# Patient Record
Sex: Male | Born: 1960 | Race: White | Hispanic: No | Marital: Single | Smoking: Former smoker
Health system: Southern US, Community
[De-identification: ages and names within clinical notes are randomized; demographics above are authoritative.]

## PROBLEM LIST (undated history)

## (undated) DIAGNOSIS — G709 Myoneural disorder, unspecified: Secondary | ICD-10-CM

## (undated) DIAGNOSIS — M1990 Unspecified osteoarthritis, unspecified site: Secondary | ICD-10-CM

## (undated) DIAGNOSIS — I2510 Atherosclerotic heart disease of native coronary artery without angina pectoris: Secondary | ICD-10-CM

## (undated) DIAGNOSIS — E785 Hyperlipidemia, unspecified: Secondary | ICD-10-CM

## (undated) DIAGNOSIS — K219 Gastro-esophageal reflux disease without esophagitis: Secondary | ICD-10-CM

## (undated) DIAGNOSIS — T7840XA Allergy, unspecified, initial encounter: Secondary | ICD-10-CM

## (undated) DIAGNOSIS — I10 Essential (primary) hypertension: Secondary | ICD-10-CM

## (undated) DIAGNOSIS — I509 Heart failure, unspecified: Secondary | ICD-10-CM

## (undated) HISTORY — DX: Unspecified osteoarthritis, unspecified site: M19.90

## (undated) HISTORY — DX: Gastro-esophageal reflux disease without esophagitis: K21.9

## (undated) HISTORY — DX: Myoneural disorder, unspecified: G70.9

## (undated) HISTORY — PX: POLYPECTOMY: SHX149

## (undated) HISTORY — DX: Allergy, unspecified, initial encounter: T78.40XA

## (undated) HISTORY — PX: BELOW KNEE LEG AMPUTATION: SUR23

## (undated) HISTORY — PX: APPENDECTOMY: SHX54

## (undated) HISTORY — DX: Hyperlipidemia, unspecified: E78.5

## (undated) HISTORY — PX: COLONOSCOPY: SHX174

---

## 2003-10-24 ENCOUNTER — Inpatient Hospital Stay (HOSPITAL_COMMUNITY): Admission: AD | Admit: 2003-10-24 | Discharge: 2003-10-30 | Payer: Self-pay | Admitting: General Surgery

## 2003-10-24 ENCOUNTER — Encounter (INDEPENDENT_AMBULATORY_CARE_PROVIDER_SITE_OTHER): Payer: Self-pay | Admitting: Specialist

## 2004-10-13 ENCOUNTER — Encounter: Admission: RE | Admit: 2004-10-13 | Discharge: 2005-01-11 | Payer: Self-pay | Admitting: Family Medicine

## 2008-02-13 ENCOUNTER — Encounter: Admission: RE | Admit: 2008-02-13 | Discharge: 2008-02-13 | Payer: Self-pay | Admitting: Family Medicine

## 2010-06-26 NOTE — H&P (Signed)
NAME:  Dustin Lozano, Dustin Lozano                          ACCOUNT NO.:  [id number]   MEDICAL RECORD NO.:  [id number]                   PATIENT TYPE:  AMB   LOCATION:  DAY                                  FACILITY:  [facility]   PHYSICIAN:  Adolph Pollack, M.D.            DATE OF BIRTH:  07-Jan-1961   DATE OF ADMISSION:  10/24/2003  DATE OF DISCHARGE:                                HISTORY & PHYSICAL   REASON FOR ADMISSION:  Appendicitis.   HISTORY OF PRESENT ILLNESS:  This 50 year old male awoke 2 days ago with  some diffuse abdominal pain.  He states it felt like somebody punched him in  the stomach.  It subsequently was followed by fever and nausea.  The pain  then localized to the right lower quadrant.  He saw Dr. Susann Givens who ordered  the CT scan.  This was consistent with appendicitis.  I subsequently was  asked to see him.   PAST MEDICAL HISTORY:  Hypertension.   PREVIOUS OPERATIONS:  None.   ALLERGIES:  None.   MEDICATIONS:  Lisinopril.   SOCIAL HISTORY:  He is single.  Former smoker.  Occasional alcohol use.  He  is a Medical illustrator.   FAMILY HISTORY:  Positive for diabetes and heart disease and hypertension in  his father.  Positive for diabetes and lymphoma in his mother.   REVIEW OF SYSTEMS:  CARDIOVASCULAR:  No known heart disease.  PULMONARY:  No  asthma, pneumonia, chronic lung disease, TB.  GI:  No peptic ulcer disease,  hepatitis, diverticulitis, or colitis.  GU:  No kidney stones, urinary tract  infections.  ENDOCRINE:  He denies diabetes, hypercholesterolemia, thyroid  disease.  NEUROLOGIC:  No seizure disorders.  HEMATOLOGIC:  No known  bleeding disorders, blood clots, or transfusions.   PHYSICAL EXAMINATION:  GENERAL:  Ill-appearing male fairly stout.  VITAL SIGNS:  Temperature is 98, pulse 110, blood pressure is 160/113.  EYES:  Extraocular motions intact.  No icterus.  NECK:  Supple without palpable masses or obvious thyroid enlargement.  RESPIRATORY:  Breath sounds  equal and quiet.  Respirations unlabored.  CARDIOVASCULAR:  Increased rate with a regular rhythm.  No lower extremity  edema present.  ABDOMEN:  Soft, mildly obese with a reducible umbilical hernia.  There is  right lower quadrant tenderness and guarding to palpation.  No obvious  masses.  MUSCULOSKELETAL:  Good muscle tone.  Full range of motion.   LABORATORY DATA:  Hemoglobin is 16, white cell count 19,300, potassium 3.1,  sodium 132, glucose 191, BUN and creatinine normal.   CT was reviewed.   IMPRESSION:  Acute appendicitis.  Also has some hypertension.   PLAN:  Laparoscopic/possible open appendectomy.  I did explain the procedure  and the risks.  The risks include but are not limited to bleeding,  infection, accidental damage to intra-abdominal organs (intestine, ureter,  bladder, liver), and risk of general anesthesia.  He seems to  understand  this and agreeable to proceeding.                                               Adolph Pollack, M.D.    Kari Baars  D:  10/24/2003  T:  10/24/2003  Job:  [id number]   cc:   Sharlot Gowda, M.D.  [address]  [place], [place] [address]  Fax: [phone]

## 2010-06-26 NOTE — Op Note (Signed)
NAME:  Dustin Lozano, Dustin Lozano                          ACCOUNT NO.:  [id number]   MEDICAL RECORD NO.:  [id number]                   PATIENT TYPE:  AMB   LOCATION:  DAY                                  FACILITY:  [facility]   PHYSICIAN:  Adolph Pollack, M.D.            DATE OF BIRTH:  [birth year]   DATE OF PROCEDURE:  10/24/2003  DATE OF DISCHARGE:                                 OPERATIVE REPORT   PREOPERATIVE DIAGNOSES:  Appendicitis.   POSTOPERATIVE DIAGNOSES:  Ruptured appendicitis with abscess.   PROCEDURE:  Diagnostic laparoscopy converted to exploratory laparotomy with  ileocecectomy.   SURGEON:  Adolph Pollack, M.D.   ANESTHESIA:  General.   INDICATIONS FOR PROCEDURE:  This 50 year old male was seen by his primary  care physician and sent for two CT scans which demonstrated acute  appendicitis.  His white count is 19,000. He has been having fevers and  chills. He is now brought to the operating room for appendectomy. The  procedure and the risks were discussed with him preoperatively.   TECHNIQUE:  He was placed supine on the operating table and a general  anesthetic was administered.  A Foley catheter was placed in the bladder.  The abdominal wall was sterilely prepped and draped.  A transverse  subumbilical incision was made through the skin and subcutaneous tissue,  fascia and peritoneum. A pursestring suture of #0 Vicryl was placed around  the fascial edges. A Hasson trocar was introduced into the peritoneal cavity  and pneumoperitoneum was created by insufflation of CO2 gas.   Next, the laparoscope was introduced and some purulent fluid was seen in the  right lower quadrant. I then placed a 10 mm trocar in the left lower  quadrant region and one in the right upper quadrant.  I examined the right  lower quadrant contents. The appendix was firmly adherent to the right  sidewall.  Upon mobilizing this, I opened up an abscess cavity and drained  it and suctioned out  abscess fluid. I began mobilizing the cecum and ileum.  Omentum was also in the area and was mobilized. I traced the appendix down,  however, I could not see a definite connection to the cecum and I had  suspicion that he ruptured appendix at the base of the cecum.  Because of  this, I decided to convert to an open procedure.   An oblique incision was made in the right lower quadrant through the skin  and subcutaneous tissue and anterior fascia.  Muscle was split bluntly and  the fascia and peritoneum divided, peritoneal cavity entered.  I  subsequently mobilized the ascending colon and the cecum and distal ileum  and brought it up into the field. The appendix basically had perforated and  was already disconnected from the cecum. I could not see a definite site to  oversew the cecum. Based on this, I thought I needed to perform an  ileocecectomy.   I subsequently divided the ascending colon just proximal to the cecum and  also divided the ileum just proximal to the ileocecal valve. The mesentery  was divided between clamps and vessels ligated.  The specimen was handed off  the field including the appendix with it.  I then performed a side to side  stapled anastomosis. I had to oversew part of the anterior and posterior  staple line as it had some bleeding. The remaining enterotomy was closed  with a linear noncutting stapler and the staple line was oversewed as well.  The anastomosis was patent, viable and under no tension.  I subsequently  dropped it back down into the abdominal cavity.   At this portion, gloves were changed. I copiously irrigated out the  abdominal cavity with 3 liters of warm saline solution and hemostasis  appeared to be adequate. I then closed the posterior fascia with a running  #0 PDS suture.  The anterior fascia was closed with a running #1 PDS suture.  The subcutaneous tissue was irrigated. The trocars were all removed. The  subumbilical fascial defect was  closed by tightening up and tying down the  #0 Vicryl pursestring suture.  I then closed the right lower quadrant very  loosely with staples and closed the trocar sites with staples and the Steri-  Strips. Sterile dressings were applied. He tolerated the procedure well  without any apparent complications and was subsequently taken to the  recovery room in satisfactory condition.                                               Adolph Pollack, M.D.    Dustin Lozano  D:  10/24/2003  T:  10/25/2003  Job:  [id number]   cc:   Dustin Lozano, M.D.  [address]  [place], [place] [address]  Fax: [phone]

## 2010-06-26 NOTE — Discharge Summary (Signed)
NAME:  Dustin Lozano, Dustin Lozano NO.:  [id number]   MEDICAL RECORD NO.:  [id number]          PATIENT TYPE:  INP   LOCATION:  0357                         [facility]ACILITY:  [facility]   PHYSICIAN:  Adolph Pollack, M.D.DATE OF BIRTH:  1960/02/20   DATE OF ADMISSION:  10/24/2003  DATE OF DISCHARGE:  10/30/2003                                 DISCHARGE SUMMARY   PRINCIPAL DISCHARGE DIAGNOSIS:  Perforated appendicitis.   SECONDARY DIAGNOSES:  1.  Mild postoperative ileus.  2.  Hypertension.  3.  Hyperglycemia.   PROCEDURE:  Exploratory laparoscopy converted as exploratory laparotomy and  ileocecectomy October 24, 2003.   REASON FOR ADMISSION:  This 50 year old male had the onset of diffuse  abdominal pain two days prior to admission that was accompanied by a fever  and chills and nausea. The pain then radiated to the right lower quadrant.  He was seen by Dr. Susann Givens and a CT was ordered that was consistent with  appendicitis.  I saw him in the emergency department and noticed that he had  a white blood cell count of 19,000 and his glucose was 191.  He subsequently  was admitted and taken to the operating room.   HOSPITAL COURSE:  He was taken to the operating room where he underwent the  above procedure October 24, 2003.  Postoperatively he was maintained on IV  Zosyn.  I did notice him to be persistently hyperglycemic with blood sugars  ranging between the 120's up to the 170's at times. He has a family history  of diabetes but states he never had been diagnosed with diabetes.  He had a  mild ileus but the NG tube was able to be removed.  We started to advance  his diet which he tolerated.  Hypertension was relatively well controlled  with lisinopril and a Catapres patch.  He was changed over to an oral  analgesic and a solid diet on postoperative day five. By postoperative day  six, he remained afebrile. He did have some intermittent hypertension  immediately before his  lisinopril dose in the morning. His blood sugar was  153.  Overall, I felt he was doing well, the wounds were healing nicely and  he was ready for discharge.   DISPOSITION:  Discharged to home October 30, 2003.  I told him he needs to  followup with Dr. Susann Givens and have his blood pressure checked as well as his  blood sugar checked just to make sure he has not developed a type 2  diabetes.  I will have him come back and see me in one week for staple  removal and wound checked. He has been given discharge instructions. I will  have him take a week's worth of Augmentin and he was given Tylox for pain.  His condition was satisfactory.      TJR/MEDQ  D:  10/30/2003  T:  10/31/2003  Job:  [id number]   cc:   Sharlot Gowda, M.D.  [address]  [place], [place] [address]  Fax: [phone]

## 2011-08-14 ENCOUNTER — Emergency Department (HOSPITAL_COMMUNITY): Payer: Self-pay

## 2011-08-14 ENCOUNTER — Inpatient Hospital Stay (HOSPITAL_COMMUNITY)
Admission: EM | Admit: 2011-08-14 | Discharge: 2011-08-18 | DRG: 286 | Disposition: A | Discharge status: Home / Self Care | Payer: 59 | Attending: Internal Medicine | Admitting: Internal Medicine

## 2011-08-14 ENCOUNTER — Encounter (HOSPITAL_COMMUNITY): Payer: Self-pay

## 2011-08-14 DIAGNOSIS — R7309 Other abnormal glucose: Secondary | ICD-10-CM

## 2011-08-14 DIAGNOSIS — E876 Hypokalemia: Secondary | ICD-10-CM | POA: Diagnosis present

## 2011-08-14 DIAGNOSIS — I2510 Atherosclerotic heart disease of native coronary artery without angina pectoris: Secondary | ICD-10-CM | POA: Diagnosis present

## 2011-08-14 DIAGNOSIS — E785 Hyperlipidemia, unspecified: Secondary | ICD-10-CM | POA: Diagnosis present

## 2011-08-14 DIAGNOSIS — I10 Essential (primary) hypertension: Principal | ICD-10-CM | POA: Diagnosis present

## 2011-08-14 DIAGNOSIS — I509 Heart failure, unspecified: Secondary | ICD-10-CM | POA: Diagnosis present

## 2011-08-14 DIAGNOSIS — Z87891 Personal history of nicotine dependence: Secondary | ICD-10-CM

## 2011-08-14 DIAGNOSIS — I428 Other cardiomyopathies: Secondary | ICD-10-CM | POA: Diagnosis present

## 2011-08-14 DIAGNOSIS — R739 Hyperglycemia, unspecified: Secondary | ICD-10-CM

## 2011-08-14 DIAGNOSIS — I5021 Acute systolic (congestive) heart failure: Secondary | ICD-10-CM | POA: Diagnosis present

## 2011-08-14 DIAGNOSIS — E119 Type 2 diabetes mellitus without complications: Secondary | ICD-10-CM | POA: Diagnosis present

## 2011-08-14 HISTORY — DX: Essential (primary) hypertension: I10

## 2011-08-14 LAB — PRO B NATRIURETIC PEPTIDE: Pro B Natriuretic peptide (BNP): 1396 pg/mL — ABNORMAL HIGH (ref 0–125)

## 2011-08-14 LAB — POCT I-STAT TROPONIN I: Troponin i, poc: 0 ng/mL (ref 0.00–0.08)

## 2011-08-14 LAB — CARDIAC PANEL(CRET KIN+CKTOT+MB+TROPI)
CK, MB: 1.8 ng/mL (ref 0.3–4.0)
Relative Index: INVALID (ref 0.0–2.5)
Total CK: 48 U/L (ref 7–232)

## 2011-08-14 LAB — CBC: Hemoglobin: 16.7 g/dL (ref 13.0–17.0)

## 2011-08-14 LAB — BASIC METABOLIC PANEL
Creatinine, Ser: 0.67 mg/dL (ref 0.50–1.35)
GFR calc Af Amer: >90 mL/min (ref >90)
Potassium: 3.2 mEq/L — ABNORMAL LOW (ref 3.5–5.1)

## 2011-08-14 LAB — GLUCOSE, RANDOM: Glucose, Bld: 424 mg/dL — ABNORMAL HIGH (ref 70–99)

## 2011-08-14 LAB — GLUCOSE, CAPILLARY: Glucose-Capillary: 420 mg/dL — ABNORMAL HIGH (ref 70–99)

## 2011-08-14 MED ORDER — FUROSEMIDE 10 MG/ML IJ SOLN
20.0000 mg | Freq: Once | INTRAMUSCULAR | Status: AC
  Administered 2011-08-14: 20 mg via INTRAVENOUS
  Filled 2011-08-14: qty 4

## 2011-08-14 MED ORDER — ONDANSETRON HCL 4 MG/2ML IJ SOLN: 4.0000 mg | INTRAMUSCULAR | Status: DC | PRN

## 2011-08-14 MED ORDER — ACETAMINOPHEN 325 MG PO TABS: 650.0000 mg | ORAL_TABLET | ORAL | Status: DC | PRN

## 2011-08-14 MED ORDER — POTASSIUM CHLORIDE CRYS ER 20 MEQ PO TBCR
60.0000 meq | EXTENDED_RELEASE_TABLET | Freq: Once | ORAL | Status: AC
  Administered 2011-08-14: 60 meq via ORAL
  Filled 2011-08-14: qty 3

## 2011-08-14 MED ORDER — LISINOPRIL 20 MG PO TABS
40.0000 mg | ORAL_TABLET | Freq: Every day | ORAL | Status: DC
  Administered 2011-08-15 – 2011-08-18 (×4): 40 mg via ORAL
  Filled 2011-08-14 (×4): qty 2

## 2011-08-14 MED ORDER — FUROSEMIDE 10 MG/ML IJ SOLN
20.0000 mg | Freq: Two times a day (BID) | INTRAMUSCULAR | Status: DC
  Administered 2011-08-15: 20 mg via INTRAVENOUS
  Filled 2011-08-14 (×3): qty 2

## 2011-08-14 MED ORDER — ALBUTEROL SULFATE (5 MG/ML) 0.5% IN NEBU
2.5000 mg | INHALATION_SOLUTION | Freq: Once | RESPIRATORY_TRACT | Status: AC
  Administered 2011-08-14: 2.5 mg via RESPIRATORY_TRACT
  Filled 2011-08-14: qty 0.5

## 2011-08-14 MED ORDER — INSULIN ASPART 100 UNIT/ML ~~LOC~~ SOLN
0.0000 [IU] | Freq: Three times a day (TID) | SUBCUTANEOUS | Status: DC
  Administered 2011-08-15: 3 [IU] via SUBCUTANEOUS
  Administered 2011-08-15: 2 [IU] via SUBCUTANEOUS
  Administered 2011-08-15 – 2011-08-16 (×3): 1 [IU] via SUBCUTANEOUS
  Administered 2011-08-16: 2 [IU] via SUBCUTANEOUS
  Administered 2011-08-17: 3 [IU] via SUBCUTANEOUS
  Administered 2011-08-18: 1 [IU] via SUBCUTANEOUS

## 2011-08-14 MED ORDER — LISINOPRIL 20 MG PO TABS
40.0000 mg | ORAL_TABLET | ORAL | Status: AC
  Administered 2011-08-14: 40 mg via ORAL
  Filled 2011-08-14: qty 2

## 2011-08-14 MED ORDER — INSULIN ASPART 100 UNIT/ML ~~LOC~~ SOLN: 0.0000 [IU] | Freq: Every day | SUBCUTANEOUS | Status: DC

## 2011-08-14 MED ORDER — ASPIRIN 81 MG PO CHEW
CHEWABLE_TABLET | ORAL | Status: AC
  Filled 2011-08-14: qty 4

## 2011-08-14 MED ORDER — POTASSIUM CHLORIDE CRYS ER 20 MEQ PO TBCR
20.0000 meq | EXTENDED_RELEASE_TABLET | Freq: Two times a day (BID) | ORAL | Status: DC
  Filled 2011-08-14: qty 3
  Filled 2011-08-14 (×2): qty 1

## 2011-08-14 MED ORDER — IPRATROPIUM BROMIDE 0.02 % IN SOLN
0.5000 mg | Freq: Once | RESPIRATORY_TRACT | Status: AC
  Administered 2011-08-14: 0.5 mg via RESPIRATORY_TRACT
  Filled 2011-08-14: qty 2.5

## 2011-08-14 MED ORDER — ENOXAPARIN SODIUM 40 MG/0.4ML ~~LOC~~ SOLN
40.0000 mg | SUBCUTANEOUS | Status: DC
  Administered 2011-08-14 – 2011-08-15 (×2): 40 mg via SUBCUTANEOUS
  Filled 2011-08-14 (×3): qty 0.4

## 2011-08-14 MED ORDER — HYDRALAZINE HCL 20 MG/ML IJ SOLN
10.0000 mg | INTRAMUSCULAR | Status: DC | PRN
  Filled 2011-08-14: qty 0.5

## 2011-08-14 MED ORDER — ASPIRIN EC 81 MG PO TBEC
81.0000 mg | DELAYED_RELEASE_TABLET | Freq: Every day | ORAL | Status: DC
  Administered 2011-08-15 – 2011-08-18 (×4): 81 mg via ORAL
  Filled 2011-08-14 (×5): qty 1

## 2011-08-14 MED ORDER — INSULIN ASPART 100 UNIT/ML ~~LOC~~ SOLN
12.0000 [IU] | Freq: Once | SUBCUTANEOUS | Status: AC
  Administered 2011-08-14: 12 [IU] via SUBCUTANEOUS

## 2011-08-14 MED ORDER — ALBUTEROL SULFATE (5 MG/ML) 0.5% IN NEBU
5.0000 mg | INHALATION_SOLUTION | Freq: Once | RESPIRATORY_TRACT | Status: AC
  Administered 2011-08-14: 5 mg via RESPIRATORY_TRACT
  Filled 2011-08-14: qty 1

## 2011-08-14 MED ORDER — SODIUM CHLORIDE 0.9 % IV SOLN: 250.0000 mL | INTRAVENOUS | Status: DC | PRN

## 2011-08-14 MED ORDER — SODIUM CHLORIDE 0.9 % IJ SOLN: 3.0000 mL | INTRAMUSCULAR | Status: DC | PRN

## 2011-08-14 MED ORDER — NITROGLYCERIN 0.4 MG SL SUBL: 0.4000 mg | SUBLINGUAL_TABLET | SUBLINGUAL | Status: DC | PRN

## 2011-08-14 MED ORDER — NITROGLYCERIN 2 % TD OINT
1.0000 [in_us] | TOPICAL_OINTMENT | Freq: Once | TRANSDERMAL | Status: AC
  Administered 2011-08-14: 1 [in_us] via TOPICAL
  Filled 2011-08-14: qty 30

## 2011-08-14 MED ORDER — ASPIRIN 325 MG PO TABS
325.0000 mg | ORAL_TABLET | ORAL | Status: DC
  Filled 2011-08-14: qty 4

## 2011-08-14 MED ORDER — SODIUM CHLORIDE 0.9 % IJ SOLN
3.0000 mL | Freq: Two times a day (BID) | INTRAMUSCULAR | Status: DC
  Administered 2011-08-14 – 2011-08-17 (×7): 3 mL via INTRAVENOUS
  Administered 2011-08-18: 10:00:00 via INTRAVENOUS

## 2011-08-14 MED ORDER — METHYLPREDNISOLONE SODIUM SUCC 125 MG IJ SOLR
125.0000 mg | Freq: Once | INTRAMUSCULAR | Status: AC
  Administered 2011-08-14: 125 mg via INTRAVENOUS
  Filled 2011-08-14: qty 2

## 2011-08-14 NOTE — H&P (Signed)
PCP:  unassigned  Chief Complaint:  Shortness of breath for 4 days  HPI: Dustin Lozano is an 51 y.o. male.  Middle-aged Caucasian gentleman, no medical followup for hypertension for the past 6 years, has lost over 70 pounds by deliberate diet and exercise over this period, and has considered himself in good health, until he developed progressive he is shortness of breath with exertion, and lying flat, and difficulty sleeping because of shortness of breath while lying down, all over the past 4 days. Denies leg edema, denies hemoptysis.  Quit smoking 5 years ago; drinks 8-12 glasses of water daily as a part of his health regimen.  Denies palpitations, chest pain, black or bloody stool, nausea or vomiting, fever cough or cold. Denies alcohol abuse or abdominal distention.  Had upper respiratory infection about 6 weeks ago, and it persisted for about 2 weeks. Denies diuretic use.  Rewiew of Systems:  The patient denies anorexia, fever, weight loss,, vision loss, decreased hearing, hoarseness, chest pain, syncope,  peripheral edema, balance deficits, hemoptysis, abdominal pain, melena, hematochezia, severe indigestion/heartburn, hematuria, incontinence, genital sores, muscle weakness, suspicious skin lesions, transient blindness, difficulty walking, depression, unusual weight change, abnormal bleeding, enlarged lymph nodes, angioedema, and breast masses.    Past Medical History  Diagnosis Date  . HTN (hypertension)     Past Surgical History  Procedure Date  . Appendectomy     Medications:  HOME MEDS: Prior to Admission medications   Medication Sig Start Date End Date Taking? Authorizing Provider  Multiple Vitamin (MULTIVITAMIN WITH MINERALS) TABS Take 1 tablet by mouth daily.   Yes Historical Provider, MD     Allergies:  No Known Allergies  Social History:   reports that he quit smoking about 5 years ago. He does not have any smokeless tobacco history on file. He reports that  he does not drink alcohol or use illicit drugs.  Family History: Family History  Problem Relation Age of Onset  . Diabetes Father   . Heart failure Father   . Cancer Mother     lymphoma  . Diabetes Mother      Physical Exam: Filed Vitals:   08/14/11 1602 08/14/11 1630 08/14/11 1830  BP: 167/111 165/113 177/108  Pulse: 118 118 117  Temp: 98.9 F (37.2 C)    TempSrc: Oral    Resp: 20 28 29   SpO2: 95%     Blood pressure 177/108, pulse 117, temperature 98.9 F (37.2 C), temperature source Oral, resp. rate 29, SpO2 95.00%.  GEN:  Pleasant middle-aged Caucasian gentleman  lying in the stretcher; cooperative with exam PSYCH:  alert and oriented x4; does not appear anxious or depressed; affect is appropriate. HEENT: Mucous membranes pink and anicteric; PERRLA; EOM intact; no cervical lymphadenopathy nor thyromegaly or carotid bruit; JVD to angle of jaw ; Breasts:: Not examined CHEST WALL: No tenderness CHEST: Normal respiration, clear to auscultation bilaterally HEART:  tachycardic regular rhythm ; no murmurs rubs or gallops BACK: No kyphosis or scoliosis; no CVA tenderness ABDOMEN: Obese, soft non-tender; no masses, no organomegaly, normal abdominal bowel sounds; no pannus; no intertriginous candida. Rectal Exam: Not done EXTREMITIES: age-appropriate arthropathy of the hands and knees; no edema; no ulcerations. Genitalia: not examined PULSES: 2+ and symmetric SKIN: Normal hydration, rudy complexion  no rash or ulceration CNS: Cranial nerves 2-12 grossly intact no focal lateralizing neurologic deficit   Labs & Imaging Results for orders placed during the hospital encounter of 08/14/11 (from the past 48 hour(s))  CBC  Status: Abnormal   Collection Time   08/14/11  4:23 PM      Component Value Range Comment   WBC 9.2  4.0 - 10.5 K/uL    RBC 5.85 (*) 4.22 - 5.81 MIL/uL    Hemoglobin 16.7  13.0 - 17.0 g/dL    HCT 16.1  09.6 - 04.5 %    MCV 80.0  78.0 - 100.0 fL    MCH 28.5   26.0 - 34.0 pg    MCHC 35.7  30.0 - 36.0 g/dL    RDW 40.9  81.1 - 91.4 %    Platelets 363  150 - 400 K/uL   BASIC METABOLIC PANEL     Status: Abnormal   Collection Time   08/14/11  4:23 PM      Component Value Range Comment   Sodium 138  135 - 145 mEq/L    Potassium 3.2 (*) 3.5 - 5.1 mEq/L    Chloride 101  96 - 112 mEq/L    CO2 23  19 - 32 mEq/L    Glucose, Bld 155 (*) 70 - 99 mg/dL    BUN 11  6 - 23 mg/dL    Creatinine, Ser 7.82  0.50 - 1.35 mg/dL    Calcium 9.8  8.4 - 95.6 mg/dL    GFR calc non Af Amer >90  >90 mL/min    GFR calc Af Amer >90  >90 mL/min   PRO B NATRIURETIC PEPTIDE     Status: Abnormal   Collection Time   08/14/11  4:23 PM      Component Value Range Comment   Pro B Natriuretic peptide (BNP) 1396.0 (*) 0 - 125 pg/mL   POCT I-STAT TROPONIN I     Status: Normal   Collection Time   08/14/11  4:38 PM      Component Value Range Comment   Troponin i, poc 0.00  0.00 - 0.08 ng/mL    Comment 3             Dg Chest 2 View  08/14/2011  *RADIOLOGY REPORT*  Clinical Data: Short of breath  CHEST - 2 VIEW  Comparison: Chest radiograph 10/24/2003  Findings: Normal cardiac silhouette.  There is increased bilateral pleural effusions compared to prior.  These are small in volume. There is a mild interstitial edema pattern.  There is central bronchitic markings are similar to prior.  No focal consolidation. No pneumothorax.  IMPRESSION: Bilateral small effusions interstitial edema which is mild.  Original Report Authenticated By: Genevive Bi, M.D.      Assessment Present on Admission:  .HTN (hypertension), malignant, presenting as acute CHF  .CHF, acute, probably due to uncontrolled hypertension; possible viral myocarditis  .Hyperglycemia .Hypokalemia, possibly related to excess water intake   PLAN:  we'll continue diuresing this gentleman, initial aggressive replacement of potassium, given additional losses which will come from diuresis.   Will restart lisinopril now, and  give when necessary doses of hydralazine for blood pressure control; will not start a beta blocker while he is in acute failure, despite tachycardic.  2-D echo, tsh,re-check BNP and lipid panel in the morning.  Check hemoglobin A1c and use sliding scale for hyperglycemia.  Counsel on the importance of Tight blood pressure control to preserve cardiovascular integrity  Other plans as per orders.  Zebadiah Willert 08/14/2011, 6:54 PM

## 2011-08-14 NOTE — ED Notes (Signed)
Report given to receiving RN.

## 2011-08-14 NOTE — ED Notes (Addendum)
[facility]/CP states onset 4 days ago denies n/v denies pain radiating states only comfort with sob is when sitting up, denies swelling in extremities, states pain in chest is tightness and squeezing

## 2011-08-14 NOTE — ED Notes (Signed)
Physician at bedside.

## 2011-08-14 NOTE — ED Notes (Signed)
Pt transported to XR.  

## 2011-08-14 NOTE — ED Notes (Signed)
Pt back in room from XR 

## 2011-08-14 NOTE — ED Provider Notes (Signed)
History     CSN: [id number]  Arrival date & time 08/14/11  1540   First MD Initiated Contact with Patient 08/14/11 1611      Chief Complaint  Patient presents with  . Shortness of Breath    (Consider location/radiation/quality/duration/timing/severity/associated sxs/prior treatment) HPI This 51 year old male has had untreated hypertension for several years, he does not have any known coronary artery disease, he presents with 4 days of constant 24-hour a day mild to moderate shortness of breath, sensation is worse when he is supine and better if he sits up, it is not necessarily worsened with exertion however, and he has no chest pain no cough no fever no abdominal pain no vomiting no bloody stools. He is no weight gain and no edema. There is no treatment prior to arrival. Past Medical History  Diagnosis Date  . HTN (hypertension)     Past Surgical History  Procedure Date  . Appendectomy     Family History  Problem Relation Age of Onset  . Diabetes Father   . Heart failure Father   . Cancer Mother     lymphoma  . Diabetes Mother     History  Substance Use Topics  . Smoking status: Former Smoker -- 0.7 packs/day for 15 years    Quit date: 02/08/2006  . Smokeless tobacco: Not on file  . Alcohol Use: No      Review of Systems  Constitutional: Negative for fever.       10 Systems reviewed and are negative for acute change except as noted in the HPI.  HENT: Negative for congestion.   Eyes: Negative for discharge and redness.  Respiratory: Positive for shortness of breath. Negative for cough and stridor.   Cardiovascular: Negative for chest pain.  Gastrointestinal: Negative for vomiting and abdominal pain.  Musculoskeletal: Negative for back pain.  Skin: Negative for rash.  Neurological: Negative for syncope, numbness and headaches.  Psychiatric/Behavioral:       No behavior change.    Allergies  Review of patient's allergies indicates no known allergies.  Home  Medications   No current outpatient prescriptions on file.  BP 152/95  Pulse 112  Temp 97.5 F (36.4 C) (Oral)  Resp 18  Ht 6' (1.829 m)  Wt 189 lb (85.73 kg)  BMI 25.63 kg/m2  SpO2 94%  Physical Exam  Nursing note and vitals reviewed. Constitutional:       Awake, alert, nontoxic appearance.  HENT:  Head: Atraumatic.  Mouth/Throat: No oropharyngeal exudate.  Eyes: Right eye exhibits no discharge. Left eye exhibits no discharge.  Neck: Neck supple.  Cardiovascular: Regular rhythm.   No murmur heard.      Tachycardic  Pulmonary/Chest: He is in respiratory distress. He has no wheezes. He has rales. He exhibits no tenderness.       Crackles halfway up the right side, basilar crackles on the left side, no wheezing retractions or rhonchi noted however the patient is a generally decreased breath sounds bilaterally without accessory muscle usage, he is able to speak full sentences with mild resp distress at rest with rare pulse oximetry normal at 95%  Abdominal: Soft. There is no tenderness. There is no rebound.  Musculoskeletal: He exhibits no edema and no tenderness.       Baseline ROM, no obvious new focal weakness.  Neurological: He is alert.       Mental status and motor strength appears baseline for patient and situation.  Skin: No rash noted.  Psychiatric: He has  a normal mood and affect.    ED Course  Procedures (including critical care time) ECG: Sinus tachycardia, ventricular rate 122, normal axis, left ventricular hypertrophy, lateral inverted T waves, compared to September 2005 the T wave changes are new in the lateral leads  Feels much better after initial neb and exp wheezes now heard as well as bibasilar rales, [facility] paged 1800. Labs Reviewed  CBC - Abnormal; Notable for the following:    RBC 5.85 (*)     All other components within normal limits  BASIC METABOLIC PANEL - Abnormal; Notable for the following:    Potassium 3.2 (*)     Glucose, Bld 155 (*)     All  other components within normal limits  PRO B NATRIURETIC PEPTIDE - Abnormal; Notable for the following:    Pro B Natriuretic peptide (BNP) 1396.0 (*)     All other components within normal limits  HEMOGLOBIN A1C - Abnormal; Notable for the following:    Hemoglobin A1C 8.7 (*)     Mean Plasma Glucose 203 (*)     All other components within normal limits  BASIC METABOLIC PANEL - Abnormal; Notable for the following:    Potassium 3.3 (*)     Glucose, Bld 245 (*)     All other components within normal limits  GLUCOSE, RANDOM - Abnormal; Notable for the following:    Glucose, Bld 424 (*)     All other components within normal limits  GLUCOSE, CAPILLARY - Abnormal; Notable for the following:    Glucose-Capillary 420 (*)     All other components within normal limits  LIPID PANEL - Abnormal; Notable for the following:    LDL Cholesterol 113 (*)     All other components within normal limits  GLUCOSE, CAPILLARY - Abnormal; Notable for the following:    Glucose-Capillary 249 (*)     All other components within normal limits  GLUCOSE, CAPILLARY - Abnormal; Notable for the following:    Glucose-Capillary 189 (*)     All other components within normal limits  POCT I-STAT TROPONIN I  MAGNESIUM  MAGNESIUM  TSH  CARDIAC PANEL(CRET KIN+CKTOT+MB+TROPI)  CARDIAC PANEL(CRET KIN+CKTOT+MB+TROPI)  CARDIAC PANEL(CRET KIN+CKTOT+MB+TROPI)   Dg Chest 2 View  08/14/2011  *RADIOLOGY REPORT*  Clinical Data: Short of breath  CHEST - 2 VIEW  Comparison: Chest radiograph 10/24/2003  Findings: Normal cardiac silhouette.  There is increased bilateral pleural effusions compared to prior.  These are small in volume. There is a mild interstitial edema pattern.  There is central bronchitic markings are similar to prior.  No focal consolidation. No pneumothorax.  IMPRESSION: Bilateral small effusions interstitial edema which is mild.  Original Report Authenticated By: Genevive Bi, M.D.     1. HTN (hypertension),  malignant   2. CHF, acute   3. Hyperglycemia   4. Hypokalemia       MDM  Pt stable in ED with no significant deterioration in condition.Patient / Family / Caregiver informed of clinical course, understand medical decision-making process, and agree with plan.        Hurman Horn, MD 08/15/11 [phone]

## 2011-08-15 LAB — CARDIAC PANEL(CRET KIN+CKTOT+MB+TROPI)
CK, MB: 2.2 ng/mL (ref 0.3–4.0)
Relative Index: INVALID (ref 0.0–2.5)
Troponin I: <0.3 ng/mL (ref <0.30)

## 2011-08-15 LAB — LIPID PANEL: Cholesterol: 174 mg/dL (ref 0–200)

## 2011-08-15 LAB — BASIC METABOLIC PANEL
CO2: 24 mEq/L (ref 19–32)
Chloride: 101 mEq/L (ref 96–112)
Creatinine, Ser: 0.64 mg/dL (ref 0.50–1.35)
Sodium: 136 mEq/L (ref 135–145)

## 2011-08-15 LAB — TSH: TSH: 1.333 u[IU]/mL (ref 0.350–4.500)

## 2011-08-15 LAB — GLUCOSE, CAPILLARY
Glucose-Capillary: 249 mg/dL — ABNORMAL HIGH (ref 70–99)
Glucose-Capillary: 161 mg/dL — ABNORMAL HIGH (ref 70–99)

## 2011-08-15 LAB — HEMOGLOBIN A1C: Mean Plasma Glucose: 203 mg/dL — ABNORMAL HIGH (ref <117)

## 2011-08-15 MED ORDER — FUROSEMIDE 10 MG/ML IJ SOLN
40.0000 mg | Freq: Two times a day (BID) | INTRAMUSCULAR | Status: DC
  Administered 2011-08-15 – 2011-08-16 (×2): 40 mg via INTRAVENOUS
  Filled 2011-08-15 (×4): qty 4

## 2011-08-15 MED ORDER — ZOLPIDEM TARTRATE 5 MG PO TABS
5.0000 mg | ORAL_TABLET | Freq: Every evening | ORAL | Status: DC | PRN
  Administered 2011-08-15 – 2011-08-17 (×3): 5 mg via ORAL
  Filled 2011-08-15 (×3): qty 1

## 2011-08-15 MED ORDER — POTASSIUM CHLORIDE CRYS ER 20 MEQ PO TBCR
40.0000 meq | EXTENDED_RELEASE_TABLET | Freq: Two times a day (BID) | ORAL | Status: DC
  Administered 2011-08-15 – 2011-08-18 (×7): 40 meq via ORAL
  Filled 2011-08-15 (×8): qty 2

## 2011-08-15 MED ORDER — INSULIN GLARGINE 100 UNIT/ML ~~LOC~~ SOLN
40.0000 [IU] | Freq: Every day | SUBCUTANEOUS | Status: DC
  Administered 2011-08-15 – 2011-08-16 (×2): 40 [IU] via SUBCUTANEOUS
  Administered 2011-08-17: 20 [IU] via SUBCUTANEOUS
  Administered 2011-08-18: 40 [IU] via SUBCUTANEOUS

## 2011-08-15 MED ORDER — PERFLUTREN LIPID MICROSPHERE 6.52 MG/ML IV SUSP
10.0000 uL/kg | Freq: Once | INTRAVENOUS | Status: AC
  Administered 2011-08-15: 0.946 mg via INTRAVENOUS
  Filled 2011-08-15: qty 2

## 2011-08-15 NOTE — Progress Notes (Signed)
Subjective: Improved shortness of breath   Objective: Vital signs in last 24 hours: Filed Vitals:   08/14/11 1932 08/14/11 1956 08/15/11 0226 08/15/11 0509  BP: 142/83 143/94 120/75 125/78  Pulse:  124 102 84  Temp:  97.7 F (36.5 C) 97.4 F (36.3 C) 97.6 F (36.4 C)  TempSrc:  Oral Oral Oral  Resp: 30 22 20 20   Height:  6' (1.829 m)    Weight:  88.134 kg (194 lb 4.8 oz)  85.73 kg (189 lb)  SpO2:  94% 91% 91%    Intake/Output Summary (Last 24 hours) at 08/15/11 0910 Last data filed at 08/15/11 0800  Gross per 24 hour  Intake      0 ml  Output   2575 ml  Net  -2575 ml    Weight change:    GEN: Pleasant middle-aged Caucasian gentleman lying in the stretcher; cooperative with exam  PSYCH: alert and oriented x4; does not appear anxious or depressed; affect is appropriate.  HEENT: Mucous membranes pink and anicteric; PERRLA; EOM intact; no cervical lymphadenopathy nor thyromegaly or carotid bruit; JVD to angle of jaw ;  Breasts:: Not examined  CHEST WALL: No tenderness  CHEST: Normal respiration, clear to auscultation bilaterally  HEART: tachycardic regular rhythm ; no murmurs rubs or gallops  BACK: No kyphosis or scoliosis; no CVA tenderness  ABDOMEN: Obese, soft non-tender; no masses, no organomegaly, normal abdominal bowel sounds; no pannus; no intertriginous candida.  Rectal Exam: Not done  EXTREMITIES: age-appropriate arthropathy of the hands and knees; no edema; no ulcerations.  Genitalia: not examined  PULSES: 2+ and symmetric  SKIN: Normal hydration, rudy complexion no rash or ulceration  CNS: Cranial nerves 2-12 grossly intact no focal lateralizing neurologic deficit  Lab Results: Results for orders placed during the hospital encounter of 08/14/11 (from the past 24 hour(s))  CBC     Status: Abnormal   Collection Time   08/14/11  4:23 PM      Component Value Range   WBC 9.2  4.0 - 10.5 K/uL   RBC 5.85 (*) 4.22 - 5.81 MIL/uL   Hemoglobin 16.7  13.0 - 17.0 g/dL     HCT 52.8  41.3 - 24.4 %   MCV 80.0  78.0 - 100.0 fL   MCH 28.5  26.0 - 34.0 pg   MCHC 35.7  30.0 - 36.0 g/dL   RDW 01.0  27.2 - 53.6 %   Platelets 363  150 - 400 K/uL  BASIC METABOLIC PANEL     Status: Abnormal   Collection Time   08/14/11  4:23 PM      Component Value Range   Sodium 138  135 - 145 mEq/L   Potassium 3.2 (*) 3.5 - 5.1 mEq/L   Chloride 101  96 - 112 mEq/L   CO2 23  19 - 32 mEq/L   Glucose, Bld 155 (*) 70 - 99 mg/dL   BUN 11  6 - 23 mg/dL   Creatinine, Ser 6.44  0.50 - 1.35 mg/dL   Calcium 9.8  8.4 - 03.4 mg/dL   GFR calc non Af Amer >90  >90 mL/min   GFR calc Af Amer >90  >90 mL/min  PRO B NATRIURETIC PEPTIDE     Status: Abnormal   Collection Time   08/14/11  4:23 PM      Component Value Range   Pro B Natriuretic peptide (BNP) 1396.0 (*) 0 - 125 pg/mL  MAGNESIUM     Status: Normal  Collection Time   08/14/11  4:25 PM      Component Value Range   Magnesium 2.0  1.5 - 2.5 mg/dL  HEMOGLOBIN Z6X     Status: Abnormal   Collection Time   08/14/11  4:25 PM      Component Value Range   Hemoglobin A1C 8.7 (*) <5.7 %   Mean Plasma Glucose 203 (*) <117 mg/dL  POCT I-STAT TROPONIN I     Status: Normal   Collection Time   08/14/11  4:38 PM      Component Value Range   Troponin i, poc 0.00  0.00 - 0.08 ng/mL   Comment 3           TSH     Status: Normal   Collection Time   08/14/11  8:11 PM      Component Value Range   TSH 1.333  0.350 - 4.500 uIU/mL  CARDIAC PANEL(CRET KIN+CKTOT+MB+TROPI)     Status: Normal   Collection Time   08/14/11  8:11 PM      Component Value Range   Total CK 48  7 - 232 U/L   CK, MB 1.8  0.3 - 4.0 ng/mL   Troponin I <0.30  <0.30 ng/mL   Relative Index RELATIVE INDEX IS INVALID  0.0 - 2.5  GLUCOSE, CAPILLARY     Status: Abnormal   Collection Time   08/14/11 10:35 PM      Component Value Range   Glucose-Capillary 420 (*) 70 - 99 mg/dL   Comment 1 Documented in Chart     Comment 2 Notify RN    GLUCOSE, RANDOM     Status: Abnormal   Collection  Time   08/14/11 10:55 PM      Component Value Range   Glucose, Bld 424 (*) 70 - 99 mg/dL  MAGNESIUM     Status: Normal   Collection Time   08/15/11  4:54 AM      Component Value Range   Magnesium 1.9  1.5 - 2.5 mg/dL  CARDIAC PANEL(CRET KIN+CKTOT+MB+TROPI)     Status: Normal   Collection Time   08/15/11  4:54 AM      Component Value Range   Total CK 34  7 - 232 U/L   CK, MB 1.3  0.3 - 4.0 ng/mL   Troponin I <0.30  <0.30 ng/mL   Relative Index RELATIVE INDEX IS INVALID  0.0 - 2.5  BASIC METABOLIC PANEL     Status: Abnormal   Collection Time   08/15/11  4:54 AM      Component Value Range   Sodium 136  135 - 145 mEq/L   Potassium 3.3 (*) 3.5 - 5.1 mEq/L   Chloride 101  96 - 112 mEq/L   CO2 24  19 - 32 mEq/L   Glucose, Bld 245 (*) 70 - 99 mg/dL   BUN 16  6 - 23 mg/dL   Creatinine, Ser 0.96  0.50 - 1.35 mg/dL   Calcium 9.5  8.4 - 04.5 mg/dL   GFR calc non Af Amer >90  >90 mL/min   GFR calc Af Amer >90  >90 mL/min  GLUCOSE, CAPILLARY     Status: Abnormal   Collection Time   08/15/11  7:48 AM      Component Value Range   Glucose-Capillary 249 (*) 70 - 99 mg/dL   Comment 1 Notify RN       Micro: No results found for this or any previous visit (from the  past 240 hour(s)).  Studies/Results: Dg Chest 2 View  08/14/2011  *RADIOLOGY REPORT*  Clinical Data: Short of breath  CHEST - 2 VIEW  Comparison: Chest radiograph 10/24/2003  Findings: Normal cardiac silhouette.  There is increased bilateral pleural effusions compared to prior.  These are small in volume. There is a mild interstitial edema pattern.  There is central bronchitic markings are similar to prior.  No focal consolidation. No pneumothorax.  IMPRESSION: Bilateral small effusions interstitial edema which is mild.  Original Report Authenticated By: Genevive Bi, M.D.    Medications:  Scheduled Meds:   . albuterol  2.5 mg Nebulization Once  . albuterol  5 mg Nebulization Once  . aspirin EC  81 mg Oral Daily  . enoxaparin  (LOVENOX) injection  40 mg Subcutaneous Q24H  . furosemide  20 mg Intravenous Once  . furosemide  20 mg Intravenous Once  . furosemide  20 mg Intravenous Q12H  . insulin aspart  0-5 Units Subcutaneous QHS  . insulin aspart  0-9 Units Subcutaneous TID WC  . insulin aspart  12 Units Subcutaneous Once  . ipratropium  0.5 mg Nebulization Once  . lisinopril  40 mg Oral NOW   Followed by  . lisinopril  40 mg Oral Daily  . methylPREDNISolone (SOLU-MEDROL) injection  125 mg Intravenous Once  . nitroGLYCERIN  1 inch Topical Once  . potassium chloride  60 mEq Oral Once   Followed by  . potassium chloride  20 mEq Oral BID  . sodium chloride  3 mL Intravenous Q12H  . DISCONTD: aspirin      . DISCONTD: aspirin  325 mg Oral STAT   Continuous Infusions:  PRN Meds:.sodium chloride, acetaminophen, hydrALAZINE, nitroGLYCERIN, ondansetron (ZOFRAN) IV, sodium chloride   Assessment: Active Problems:  HTN (hypertension), malignant  CHF, acute  Hyperglycemia  Hypokalemia   Plan: #1 2-D echo is pending #2 consult diabetes educator for new onset diabetes, change to carb modified diet, initiate Lantus #3 check a lipid panel #4 replete potassium   LOS: 1 day   Corneisha Alvi 08/15/2011, 9:10 AM

## 2011-08-15 NOTE — Progress Notes (Signed)
Cm spoke with patient concerning Md order for Home Heart Failure Screen. Patient lives alone, self pay, employed. Patient agrees with HF screen. [facility] rep Talmadge Coventry notified of new referral. No PCP. Patient provided with information concerning [facility], [facility], & [facility]. New onset Diabetic, proposed home on oral DM meds. DM consult entered per MD. Patient provided with discount rx card to assist with cost of medications. Patient eligible for indigent funds, states able to afford home meds if generic. No further needs requested. Patient provided with packet of other community resources.   Leonie Green [phone]

## 2011-08-15 NOTE — Progress Notes (Signed)
  Echocardiogram 2D Echocardiogram has been performed.  Emelia Loron 08/15/2011, 11:50 AM

## 2011-08-16 ENCOUNTER — Encounter (HOSPITAL_COMMUNITY): Payer: Self-pay | Admitting: Physician Assistant

## 2011-08-16 DIAGNOSIS — I428 Other cardiomyopathies: Secondary | ICD-10-CM

## 2011-08-16 DIAGNOSIS — I5021 Acute systolic (congestive) heart failure: Secondary | ICD-10-CM

## 2011-08-16 LAB — GLUCOSE, CAPILLARY
Glucose-Capillary: 135 mg/dL — ABNORMAL HIGH (ref 70–99)
Glucose-Capillary: 183 mg/dL — ABNORMAL HIGH (ref 70–99)
Glucose-Capillary: 152 mg/dL — ABNORMAL HIGH (ref 70–99)

## 2011-08-16 LAB — BASIC METABOLIC PANEL
BUN: 20 mg/dL (ref 6–23)
Chloride: 104 mEq/L (ref 96–112)
GFR calc Af Amer: >90 mL/min (ref >90)
GFR calc non Af Amer: >90 mL/min (ref >90)
Glucose, Bld: 140 mg/dL — ABNORMAL HIGH (ref 70–99)
Potassium: 3.9 mEq/L (ref 3.5–5.1)
Sodium: 139 mEq/L (ref 135–145)

## 2011-08-16 MED ORDER — ASPIRIN 81 MG PO CHEW
324.0000 mg | CHEWABLE_TABLET | ORAL | Status: AC
  Administered 2011-08-17: 324 mg via ORAL
  Filled 2011-08-16: qty 4

## 2011-08-16 MED ORDER — LIVING WELL WITH DIABETES BOOK
Freq: Once | Status: AC
  Administered 2011-08-16: 22:00:00
  Filled 2011-08-16 (×2): qty 1

## 2011-08-16 MED ORDER — CARVEDILOL 3.125 MG PO TABS
3.1250 mg | ORAL_TABLET | Freq: Two times a day (BID) | ORAL | Status: DC
  Administered 2011-08-16 – 2011-08-18 (×5): 3.125 mg via ORAL
  Filled 2011-08-16 (×7): qty 1

## 2011-08-16 MED ORDER — FUROSEMIDE 20 MG PO TABS
20.0000 mg | ORAL_TABLET | Freq: Every day | ORAL | Status: DC
  Administered 2011-08-17 – 2011-08-18 (×2): 20 mg via ORAL
  Filled 2011-08-16 (×2): qty 1

## 2011-08-16 MED ORDER — HEPARIN SODIUM (PORCINE) 5000 UNIT/ML IJ SOLN
5000.0000 [IU] | Freq: Three times a day (TID) | INTRAMUSCULAR | Status: AC
  Administered 2011-08-16: 5000 [IU] via SUBCUTANEOUS
  Filled 2011-08-16: qty 1

## 2011-08-16 MED ORDER — SODIUM CHLORIDE 0.9 % IJ SOLN: 3.0000 mL | INTRAMUSCULAR | Status: DC | PRN

## 2011-08-16 MED ORDER — SODIUM CHLORIDE 0.9 % IJ SOLN
3.0000 mL | Freq: Two times a day (BID) | INTRAMUSCULAR | Status: DC
  Administered 2011-08-16 – 2011-08-17 (×3): 3 mL via INTRAVENOUS

## 2011-08-16 MED ORDER — SODIUM CHLORIDE 0.9 % IV SOLN: 250.0000 mL | INTRAVENOUS | Status: DC | PRN

## 2011-08-16 NOTE — Progress Notes (Signed)
Pt viewed video on cardiac catheterization.  Reviewed information.  Pt had no questions.

## 2011-08-16 NOTE — Consult Note (Signed)
CARDIOLOGY CONSULT NOTE  Patient ID: Dustin Lozano, MRN: [id number], DOB/AGE: 1960-10-20 51 y.o. Admit date: 08/14/2011   Date of Consult: 08/16/2011 Primary Physician: No primary provider on file. Primary Cardiologist: New  Chief Complaint: shortness of breath Reason for Consult: newly recognized cardiomyopathy  HPI: 51 y/o white male with PMH of hypertension, newly diagnosed DM in hospital & no prior cardiac hx seen in consult for newly recognized cardiomyopathy. He has never seen a cardiologist and has not been followed by a PCP or taken blood pressure medication for 3 years due to lack of insurance. Wednesday, 08/11/11, the the patient became acutely aware of shortness of breath while walking, and the shortness of breath worsened when he lay down to sleep at night. He could not lie flat in the bed due to his shortness of breath and also experienced chest "pounding," as he tried to breathe while lying flat. He was able to sleep ~2 hours Wednesday-Friday evenings on 2 pillows, but due to persistence of his SOB he presented to an urgent care for evaluation on Saturday, 08/14/11.  The urgent care performed an EKG and following the EKG recommended he present to an emergency department for further evaluation which he did.  Workup thus far reveals EF 25-30% by echo with diffuse hypokinesis. pBNP was 1396. CE's have been negative x 3. Glucose was high with A1C 8.7 indicating new dx of DM. TSH WNL. He has since been placed on IV Lasix with subsequent improvement in symptoms, has diuresed about 3 L and 5lbs. He feels much better and is actually requesting to go home.  Past Medical History  Diagnosis Date  . HTN (hypertension)   . Hyperglycemia       Most Recent Cardiac Studies: 2D Echo 08/15/11 Study Conclusions - Left ventricle: The cavity size was mildly dilated. There was mild concentric hypertrophy. Systolic function was severely reduced. The estimated ejection fraction was in the range of 25% to 30%.  Diffuse hypokinesis with asynchronous contraction. Doppler parameters are consistent with a reversible restrictive pattern, indicative of decreased left ventricular diastolic compliance and/or increased left atrial pressure (grade 3 diastolic dysfunction). - Mitral valve: Mild regurgitation.   Surgical History:  Past Surgical History  Procedure Date  . Appendectomy      Home Meds: Prior to Admission medications   Medication Sig Start Date End Date Taking? Authorizing Provider  Multiple Vitamin (MULTIVITAMIN WITH MINERALS) TABS Take 1 tablet by mouth daily.   Yes Historical Provider, MD    Inpatient Medications:     . aspirin EC  81 mg Oral Daily  . enoxaparin (LOVENOX) injection  40 mg Subcutaneous Q24H  . furosemide  40 mg Intravenous Q12H  . insulin aspart  0-5 Units Subcutaneous QHS  . insulin aspart  0-9 Units Subcutaneous TID WC  . insulin glargine  40 Units Subcutaneous Daily  . lisinopril  40 mg Oral Daily  . perflutren lipid microspheres  10 microL/kg Intravenous Once  . potassium chloride  40 mEq Oral BID  . sodium chloride  3 mL Intravenous Q12H    Allergies: No Known Allergies  History   Social History  . Marital Status: Single    Spouse Name: N/A    Number of Children: N/A  . Years of Education: N/A   Occupational History  . salesman    Social History Main Topics  . Smoking status: Former Smoker -- 0.7 packs/day for 15 years    Quit date: 02/08/2006  . Smokeless tobacco: Not  on file  . Alcohol Use: No  . Drug Use: No  . Sexually Active: Not on file   Other Topics Concern  . Not on file   Social History Narrative  . No narrative on file     Family History  Problem Relation Age of Onset  . Diabetes Father   . Heart failure Father   . Cancer Mother     lymphoma  . Diabetes Mother      Review of Systems: General: negative for chills, fever, night sweats. +5 lb weight gain.  Cardiovascular:  +for chest pressure, +orthopnea, + SOB and dyspnea  on exertion. +palpitations at night. Negative for edema and paroxysmal nocturnal dyspnea.  Dermatological: negative for rash Respiratory: negative for cough or wheezing Urologic: negative for hematuria Abdominal: negative for nausea, vomiting, diarrhea, bright red blood per rectum, melena, or hematemesis Neurologic: negative for visual changes, syncope, or dizziness All other systems reviewed and are otherwise negative except as noted above.  Labs:  [facility] 08/15/11 1200 08/15/11 0454 08/14/11 2011  CKTOTAL 41 34 48  CKMB 2.2 1.3 1.8  TROPONINI <0.30 <0.30 <0.30   Lab Results  Component Value Date   WBC 9.2 08/14/2011   HGB 16.7 08/14/2011   HCT 46.8 08/14/2011   MCV 80.0 08/14/2011   PLT 363 08/14/2011     Lab 08/16/11 0447  NA 139  K 3.9  CL 104  CO2 26  BUN 20  CREATININE 0.82  CALCIUM 9.3  PROT --  BILITOT --  ALKPHOS --  ALT --  AST --  GLUCOSE 140*   Lab Results  Component Value Date   CHOL 174 08/15/2011   HDL 43 08/15/2011   LDLCALC 409* 08/15/2011   TRIG 88 08/15/2011    Radiology/Studies:  1. Chest 2 View 08/14/2011  *RADIOLOGY REPORT*  Clinical Data: Short of breath  CHEST - 2 VIEW  Comparison: Chest radiograph 10/24/2003  Findings: Normal cardiac silhouette.  There is increased bilateral pleural effusions compared to prior.  These are small in volume. There is a mild interstitial edema pattern.  There is central bronchitic markings are similar to prior.  No focal consolidation. No pneumothorax.  IMPRESSION: Bilateral small effusions interstitial edema which is mild.  Original Report Authenticated By: Genevive Bi, M.D.   EKG:  08/14/11 - sinus tach 122bpm LVH, nonspecific ST-T changes. No apparent Q waves 08/15/11 - sinus tach 104bpm, LVH, lateral TWI  Physical Exam: Blood pressure 118/84, pulse 104, temperature 97.6 F (36.4 C), temperature source Oral, resp. rate 16, height 6' (1.829 m), weight 189 lb 9.5 oz (86 kg), SpO2 93.00%. General: Well developed, well  nourished, in no acute distress. Head: Normocephalic, atraumatic, sclera non-icteric, no xanthomas, nares are without discharge.  Neck: Negative for carotid bruits. JVD not elevated. Lungs: Clear bilaterally to auscultation without wheezes, rales, or rhonchi. Breathing is unlabored. Heart: RRR with S1 S2. No murmurs, rubs, or gallops appreciated. Abdomen: Soft, non-distended with normoactive bowel sounds, +tenderness in RLQ. No hepatomegaly. No rebound/guarding. No obvious abdominal masses. Msk:  Strength and tone appear normal for age. Extremities: No clubbing or cyanosis. No edema.  Distal pedal pulses are 2+ and equal bilaterally. Neuro: Alert and oriented X 3. Moves all extremities spontaneously. Psych:  Responds to questions appropriately with a normal affect.   Assessment and Plan: 51 y/o white male with PMH of hypertension with newly recognized cardiomyopathy.   1. Cardiomyopathy, newly recognized with acute systolic CHF: improved with diuresis, will transition to PO Lasix.  Continue med rx with ACEI. Add low-dose BB therapy. Would recommend cardiac cath to clarify if ischemia is playing a role given cardiac risk factors. Consider adding statin but await cath results. Will D/C Lovenox for now given impending cath, change to heparin DVT ppx and hold in AM. Will need to initiate max med rx then reassess as outpatient for ICD candidacy.  2. Hypertension: improved with current measures. Continue lisinopril.   3. Newly recognized DM: continue insulin and diabetes education.  Signed, Ronie Spies PA-C 08/16/2011, 10:02 AM  Patient examined and chart reviewed.  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  Add low dose coreg.  Charlton Haws 11:56 AM 08/16/2011

## 2011-08-16 NOTE — Progress Notes (Signed)
Subjective: Feeling better No chest pain Objective: Vital signs in last 24 hours: Filed Vitals:   08/15/11 1354 08/15/11 2037 08/16/11 0213 08/16/11 0519  BP: 119/75 134/91 114/78 118/84  Pulse: 109 104 99 104  Temp: 97.4 F (36.3 C) 97.9 F (36.6 C) 97.3 F (36.3 C) 97.6 F (36.4 C)  TempSrc: Oral Oral Oral Oral  Resp: 16 16 16 16   Height:      Weight:    86 kg (189 lb 9.5 oz)  SpO2: 93% 95% 93% 93%    Intake/Output Summary (Last 24 hours) at 08/16/11 0855 Last data filed at 08/16/11 0615  Gross per 24 hour  Intake    600 ml  Output   1650 ml  Net  -1050 ml    Weight change: -2.134 kg (-4 lb 11.3 oz)   GEN: Pleasant middle-aged Caucasian gentleman lying in the stretcher; cooperative with exam  PSYCH: alert and oriented x4; does not appear anxious or depressed; affect is appropriate.  HEENT: Mucous membranes pink and anicteric; PERRLA; EOM intact; no cervical lymphadenopathy nor thyromegaly or carotid bruit; JVD to angle of jaw ;  Breasts:: Not examined  CHEST WALL: No tenderness  CHEST: Normal respiration, clear to auscultation bilaterally  HEART: tachycardic regular rhythm ; no murmurs rubs or gallops  BACK: No kyphosis or scoliosis; no CVA tenderness  ABDOMEN: Obese, soft non-tender; no masses, no organomegaly, normal abdominal bowel sounds; no pannus; no intertriginous candida.  Rectal Exam: Not done  EXTREMITIES: age-appropriate arthropathy of the hands and knees; no edema; no ulcerations.  Genitalia: not examined  PULSES: 2+ and symmetric  SKIN: Normal hydration, rudy complexion no rash or ulceration  CNS: Cranial nerves 2-12 grossly intact no focal lateralizing neurologic deficit  Lab Results: Results for orders placed during the hospital encounter of 08/14/11 (from the past 24 hour(s))  GLUCOSE, CAPILLARY     Status: Abnormal   Collection Time   08/15/11 11:35 AM      Component Value Range   Glucose-Capillary 189 (*) 70 - 99 mg/dL  CARDIAC PANEL(CRET  KIN+CKTOT+MB+TROPI)     Status: Normal   Collection Time   08/15/11 12:00 PM      Component Value Range   Total CK 41  7 - 232 U/L   CK, MB 2.2  0.3 - 4.0 ng/mL   Troponin I <0.30  <0.30 ng/mL   Relative Index RELATIVE INDEX IS INVALID  0.0 - 2.5  GLUCOSE, CAPILLARY     Status: Abnormal   Collection Time   08/15/11  5:05 PM      Component Value Range   Glucose-Capillary 145 (*) 70 - 99 mg/dL   Comment 1 Notify RN    GLUCOSE, CAPILLARY     Status: Abnormal   Collection Time   08/15/11  8:24 PM      Component Value Range   Glucose-Capillary 161 (*) 70 - 99 mg/dL  BASIC METABOLIC PANEL     Status: Abnormal   Collection Time   08/16/11  4:47 AM      Component Value Range   Sodium 139  135 - 145 mEq/L   Potassium 3.9  3.5 - 5.1 mEq/L   Chloride 104  96 - 112 mEq/L   CO2 26  19 - 32 mEq/L   Glucose, Bld 140 (*) 70 - 99 mg/dL   BUN 20  6 - 23 mg/dL   Creatinine, Ser 1.61  0.50 - 1.35 mg/dL   Calcium 9.3  8.4 - 09.6  mg/dL   GFR calc non Af Amer >90  >90 mL/min   GFR calc Af Amer >90  >90 mL/min     Micro: No results found for this or any previous visit (from the past 240 hour(s)).  Studies/Results: Dg Chest 2 View  08/14/2011  *RADIOLOGY REPORT*  Clinical Data: Short of breath  CHEST - 2 VIEW  Comparison: Chest radiograph 10/24/2003  Findings: Normal cardiac silhouette.  There is increased bilateral pleural effusions compared to prior.  These are small in volume. There is a mild interstitial edema pattern.  There is central bronchitic markings are similar to prior.  No focal consolidation. No pneumothorax.  IMPRESSION: Bilateral small effusions interstitial edema which is mild.  Original Report Authenticated By: Genevive Bi, M.D.    Medications: Scheduled Meds:   . aspirin EC  81 mg Oral Daily  . enoxaparin (LOVENOX) injection  40 mg Subcutaneous Q24H  . furosemide  40 mg Intravenous Q12H  . insulin aspart  0-5 Units Subcutaneous QHS  . insulin aspart  0-9 Units Subcutaneous TID  WC  . insulin glargine  40 Units Subcutaneous Daily  . lisinopril  40 mg Oral Daily  . perflutren lipid microspheres  10 microL/kg Intravenous Once  . potassium chloride  40 mEq Oral BID  . sodium chloride  3 mL Intravenous Q12H  . DISCONTD: furosemide  20 mg Intravenous Q12H  . DISCONTD: potassium chloride  20 mEq Oral BID   Continuous Infusions:  PRN Meds:.sodium chloride, acetaminophen, hydrALAZINE, nitroGLYCERIN, ondansetron (ZOFRAN) IV, sodium chloride, zolpidem   Assessment: Active Problems:  HTN (hypertension), malignant  CHF, acute  Hyperglycemia  Hypokalemia   Plan: #1 cardiomyopathy with an EF of 25-30%, unclear etiology of this is ischemic or not, cardiology will be consulted, will add low-dose Coreg, continue diuresis with Lasix, cardiology consulted [place] to see patient   #2 new-onset diabetes patient has been initiated on Lantus continue sliding scale insulin, patient would like to go home on oral hypoglycemics  #3 hypokalemia repleted     LOS: 2 days   [facility] 08/16/2011, 8:55 AM

## 2011-08-16 NOTE — Progress Notes (Signed)
Inpatient Diabetes Program Recommendations  AACE/ADA: New Consensus Statement on Inpatient Glycemic Control (2009)  Target Ranges:  Prepandial:   less than 140 mg/dL      Peak postprandial:   less than 180 mg/dL (1-2 hours)      Critically ill patients:  140 - 180 mg/dL   Reason for Visit: New-onset DM  51 y/o white male with PMH of hypertension, newly diagnosed DM in hospital & no prior cardiac hx seen in consult for newly recognized cardiomyopathy. He has never seen a cardiologist and has not been followed by a PCP or taken blood pressure medication for 3 years due to lack of insurance. Wednesday, 08/11/11, the the patient became acutely aware of shortness of breath while walking, and the shortness of breath worsened when he lay down to sleep at night. He could not lie flat in the bed due to his shortness of breath and also experienced chest "pounding," as he tried to breathe while lying flat. He was able to sleep ~2 hours Wednesday-Friday evenings on 2 pillows, but due to persistence of his SOB he presented to an urgent care for evaluation on Saturday, 08/14/11. The urgent care performed an EKG and following the EKG recommended he present to an emergency department for further evaluation which he did.   Pt lost over 70 pounds in past 6 years from improving diet and exercise.  Quit smoking 5 years ago.  Positive family hx DM (mother and father).  Pt states he does not want to go home on metformin "because a friend of his said that they are doing studies on it at [facility] and it causes a lot of diarrhea." Said his only vice is that he drinks a lot of Pepsis.  Discussed diet, exercise, medications for DM with pt.  States he's motivated to make changes and he has the willpower to continue a healthy portion-controlled diet.  Also discussed HgbA1C and blood sugar monitoring.  Pt said he will need to obtain PCP, but he is self-employed and does not have any insurance at this time.  Results for AENEAS, LONGSWORTH  (MRN [id number]) as of 08/16/2011 13:07  Ref. Range 08/16/2011 04:47  Sodium Latest Range: 135-145 mEq/L 139  Potassium Latest Range: 3.5-5.1 mEq/L 3.9  Chloride Latest Range: 96-112 mEq/L 104  CO2 Latest Range: 19-32 mEq/L 26  BUN Latest Range: 6-23 mg/dL 20  Creat Latest Range: 0.50-1.35 mg/dL 4.09  Calcium Latest Range: 8.4-10.5 mg/dL 9.3  GFR calc non Af Amer Latest Range: >90 mL/min >90  GFR calc Af Amer Latest Range: >90 mL/min >90  Glucose Latest Range: 70-99 mg/dL 811 (H)  Results for RIGEL, FILSINGER (MRN [id number]) as of 08/16/2011 13:07  Ref. Range 08/15/2011 07:48 08/15/2011 10:54 08/15/2011 11:35 08/15/2011 12:00 08/15/2011 12:05 08/15/2011 17:05 08/15/2011 20:24 08/16/2011 04:47 08/16/2011 06:59 08/16/2011 11:38  Glucose-Capillary Latest Range: 70-99 mg/dL 213 (H)  086 (H)   578 (H) 161 (H)  135 (H) 130 (H)     Results for YER, OLIVENCIA (MRN [id number]) as of 08/16/2011 13:07  Ref. Range 08/14/2011 16:25  Hemoglobin A1C Latest Range: <5.7 % 8.7 (H)      Inpatient Diabetes Program Recommendations Insulin - Meal Coverage: Will probably need meal coverage - start at Novolog 3 units tidwc Outpatient Referral: OP Diabetes Education consult for newly-diagnosed DM  Note: Will followup with pt tomorrow morning and he is to write down any specific questions he might have.  Discussed above with Amil Amen, RN.  Encouraged pt to  view diabetes videos on pt ed channel.

## 2011-08-16 NOTE — Progress Notes (Signed)
ANTICOAGULATION CONSULT NOTE - Initial Consult  Pharmacy Consult for Heparin SQ Indication: VTE prophylaxis  No Known Allergies  Patient Measurements: Height: 6' (182.9 cm) Weight: 189 lb 9.5 oz (86 kg) IBW/kg (Calculated) : 77.6   Vital Signs: Temp: 97.6 F (36.4 C) (07/08 0519) Temp src: Oral (07/08 0519) BP: 131/94 mmHg (07/08 1055) Pulse Rate: 104  (07/08 0519)  Labs:  Basename 08/16/11 0447 08/15/11 1200 08/15/11 0454 08/14/11 2011 08/14/11 1623  HGB -- -- -- -- 16.7  HCT -- -- -- -- 46.8  PLT -- -- -- -- 363  APTT -- -- -- -- --  LABPROT -- -- -- -- --  INR -- -- -- -- --  HEPARINUNFRC -- -- -- -- --  CREATININE 0.82 -- 0.64 -- 0.67  CKTOTAL -- 41 34 48 --  CKMB -- 2.2 1.3 1.8 --  TROPONINI -- <0.30 <0.30 <0.30 --   Estimated Creatinine Clearance: 120.9 ml/min (by C-G formula based on Cr of 0.82).   Medical History: Past Medical History  Diagnosis Date  . HTN (hypertension)   . Hyperglycemia     Medications:  Scheduled:    . aspirin EC  81 mg Oral Daily  . carvedilol  3.125 mg Oral BID WC  . furosemide  20 mg Oral Daily  . insulin aspart  0-5 Units Subcutaneous QHS  . insulin aspart  0-9 Units Subcutaneous TID WC  . insulin glargine  40 Units Subcutaneous Daily  . lisinopril  40 mg Oral Daily  . potassium chloride  40 mEq Oral BID  . sodium chloride  3 mL Intravenous Q12H  . DISCONTD: enoxaparin (LOVENOX) injection  40 mg Subcutaneous Q24H  . DISCONTD: furosemide  40 mg Intravenous Q12H    Assessment:  50 YOM currently on Lovenox for VTE prophylaxis with scheduled cardiac cath on 7/9  CBC 08/14/11 was WNL  Last dose of Lovenox 40mg  SQ given 08/15/11 at 2030  Goal of Therapy:  VTE prophylaxis   Plan:   Heparin 5000 SQ x1 dose tonight at 2100, then hold for cath 7/9 AM.  Follow up VTE prophylaxis s/p cath   Lynann Beaver PharmD, BCPS Pager [phone] 08/16/2011 12:39 PM

## 2011-08-16 NOTE — Care Management Note (Signed)
    Page 1 of 2   08/18/2011     4:41:11 PM   CARE MANAGEMENT NOTE 08/18/2011  Patient:  Dustin Lozano, Dustin Lozano   Account Number:  [id number]  Date Initiated:  08/15/2011  Documentation initiated by:  DAVIS,TYMEEKA  Subjective/Objective Assessment:   51 yo male admitted with HTN,Acute HF. NO PCP, employed.     Action/Plan:   Home when stable   Anticipated DC Date:  08/18/2011   Anticipated DC Plan:  HOME W HOME HEALTH SERVICES  In-house referral  NA      DC Planning Services  CM consult  PCP issues      [facility] Choice  HOME HEALTH   Choice offered to / List presented to:  NA        HH arranged  HH-1 RN      [facility] agency  [facility]   Status of service:  Completed, signed off Medicare Important Message given?   (If response is "NO", the following Medicare IM given date fields will be blank) Date Medicare IM given:   Date Additional Medicare IM given:    Discharge Disposition:  HOME/SELF CARE  Per UR Regulation:  Reviewed for med. necessity/level of care/duration of stay  If discussed at Long Length of Stay Meetings, dates discussed:    Comments:  08/18/11 Dustin Eskin RN,BSN NCM [phone] PAITENT AMBULATING IN HALLS.D/C HOME,NO ORDERS FOR HH.  08/16/11 Dustin Veach RN,BSN NCM [phone] [facility] FOLLOWING FOR HH,WILL NEED HH ORDER.PATIENT HAS ALL INFO,& RESOURCES TO CHOOSE PCP.PATIENT IS EXPLORING LOW COST HEALTH INSURANCE PLANS SINCE HE IS SELF EMPLOYED.  08/15/11 1247 Dustin Lozano [phone] Cm spoke with patient concerning Md order for Home Heart Failure Screen. Patient lives alone, self pay, employed. Patient agrees with HF screen. [facility] rep Talmadge Coventry notified of new referral. No PCP. Patient provided with information concerning [facility], [facility], & [facility]. New onset Diabetic, proposed home on oral DM meds. DM consult entered per MD. Patient provided with discount rx card to assist with cost of medications. Patient eligible for  indigent funds, states able to afford home meds if generic. No further needs requested. Patient provided with packet of other community resources.

## 2011-08-17 ENCOUNTER — Encounter (HOSPITAL_COMMUNITY)
Admission: EM | Disposition: A | Discharge status: Home / Self Care | Payer: Self-pay | Source: Home / Self Care | Attending: Internal Medicine

## 2011-08-17 ENCOUNTER — Ambulatory Visit (HOSPITAL_COMMUNITY)
Admission: AD | Admit: 2011-08-17 | Discharge: 2011-08-17 | Disposition: A | Discharge status: Home / Self Care | Payer: Self-pay | Source: Ambulatory Visit | Attending: Cardiovascular Disease | Admitting: Cardiovascular Disease

## 2011-08-17 DIAGNOSIS — I509 Heart failure, unspecified: Secondary | ICD-10-CM

## 2011-08-17 DIAGNOSIS — I2510 Atherosclerotic heart disease of native coronary artery without angina pectoris: Secondary | ICD-10-CM

## 2011-08-17 HISTORY — PX: LEFT AND RIGHT HEART CATHETERIZATION WITH CORONARY ANGIOGRAM: SHX5449

## 2011-08-17 LAB — BASIC METABOLIC PANEL
Calcium: 9.2 mg/dL (ref 8.4–10.5)
GFR calc Af Amer: >90 mL/min (ref >90)
GFR calc non Af Amer: >90 mL/min (ref >90)
Potassium: 4.1 mEq/L (ref 3.5–5.1)
Sodium: 136 mEq/L (ref 135–145)

## 2011-08-17 LAB — CBC
HCT: 43.2 % (ref 39.0–52.0)
MCV: 81.5 fL (ref 78.0–100.0)
Platelets: 313 10*3/uL (ref 150–400)
RBC: 5.3 MIL/uL (ref 4.22–5.81)
WBC: 9.1 10*3/uL (ref 4.0–10.5)

## 2011-08-17 LAB — PROTIME-INR
INR: 0.95 (ref 0.00–1.49)
Prothrombin Time: 12.9 seconds (ref 11.6–15.2)

## 2011-08-17 LAB — CREATININE, SERUM
GFR calc Af Amer: >90 mL/min (ref >90)
GFR calc non Af Amer: >90 mL/min (ref >90)

## 2011-08-17 LAB — GLUCOSE, CAPILLARY: Glucose-Capillary: 220 mg/dL — ABNORMAL HIGH (ref 70–99)

## 2011-08-17 SURGERY — LEFT AND RIGHT HEART CATHETERIZATION WITH CORONARY ANGIOGRAM
Anesthesia: LOCAL

## 2011-08-17 MED ORDER — SODIUM CHLORIDE 0.9 % IV SOLN
INTRAVENOUS | Status: AC
  Administered 2011-08-17: 50 mL/h via INTRAVENOUS

## 2011-08-17 MED ORDER — ONDANSETRON HCL 4 MG/2ML IJ SOLN: 4.0000 mg | Freq: Four times a day (QID) | INTRAMUSCULAR | Status: DC | PRN

## 2011-08-17 MED ORDER — ACETAMINOPHEN 325 MG PO TABS: 650.0000 mg | ORAL_TABLET | ORAL | Status: DC | PRN

## 2011-08-17 MED ORDER — SPIRONOLACTONE 12.5 MG HALF TABLET
12.5000 mg | ORAL_TABLET | Freq: Every day | ORAL | Status: DC
  Administered 2011-08-17 – 2011-08-18 (×2): 12.5 mg via ORAL
  Filled 2011-08-17 (×2): qty 1

## 2011-08-17 MED ORDER — HEPARIN SODIUM (PORCINE) 5000 UNIT/ML IJ SOLN
5000.0000 [IU] | Freq: Three times a day (TID) | INTRAMUSCULAR | Status: DC
  Administered 2011-08-17 – 2011-08-18 (×3): 5000 [IU] via SUBCUTANEOUS
  Filled 2011-08-17 (×6): qty 1

## 2011-08-17 MED ORDER — SPIRONOLACTONE 25 MG PO TABS
25.0000 mg | ORAL_TABLET | Freq: Every day | ORAL | Status: DC
Start: 2011-08-17 — End: 2011-08-17
  Filled 2011-08-17: qty 1

## 2011-08-17 NOTE — CV Procedure (Signed)
   Cardiac Catheterization Procedure Note  Name: STEIN WINDHORST MRN: [id number] DOB: 05-01-1960  Procedure: Right Heart Cath, Left Heart Cath, Selective Coronary Angiography, LV angiography  Indication: CHF with severe LV dysfunction, new diagnosis   Procedural Details: The right groin was prepped, draped, and anesthetized with 1% lidocaine. Using the modified Seldinger technique a 5 French sheath was placed in the right femoral artery and a 7 French sheath was placed in the right femoral vein. A Swan-Ganz catheter was used for the right heart catheterization. Standard protocol was followed for recording of right heart pressures and sampling of oxygen saturations. Fick cardiac output was calculated. Standard Judkins catheters were used for selective coronary angiography and left ventriculography. There were no immediate procedural complications. The patient was transferred to the post catheterization recovery area for further monitoring.  Procedural Findings: Hemodynamics RA 7 RV 36/5 PA 40/17 mean 28 PCWP 22 LV 101/19 AO 102/73  Oxygen saturations: PA 70 SVC 66 AO 94  Cardiac Output (Fick) 4.4 L/m  Cardiac Index (Fick) 2.1 L/m/m2   Coronary angiography: Coronary dominance: right  Left mainstem: Widely patent without obstructive disease  Left anterior descending (LAD): Patent to the left ventricular apex. The vessel has diffuse luminal irregularities. The first diagonal branch is small. The second diagonal is moderate in caliber. The mid LAD beyond the second diagonal branch has a 50% stenosis.  Left circumflex (LCx): mild irregularity with no significant obstructive disease. There are 2 moderate caliber obtuse marginal branches with no significant disease the   Right coronary artery (RCA):  the RCA is patent throughout. There is nonobstructive disease proximally with 30-40% stenosis. The PDA, PL branches, and acute marginal branch are all patent without significant  disease.  Left ventriculography:  LV function is severely depressed. There is severe global hypokinesis. The left ventricular ejection fraction is estimated at 20%   Final Conclusions:   1. Mild nonobstructive coronary artery disease with a 40-50% stenosis in the mid LAD, minimal stenosis in the left circumflex, and nonobstructive disease in the RCA as described above 2. Severe global left ventricular systolic dysfunction 3.  Fairly well compensated hemodynamics as above   Recommendations: medical therapy for nonischemic cardiomyopathy.   Tonny Bollman 08/17/2011, 1:16 PM

## 2011-08-17 NOTE — Interval H&P Note (Signed)
History and Physical Interval Note:  08/17/2011 12:30 PM  Dustin Lozano  has presented today for surgery, with the diagnosis of chest pain  The various methods of treatment have been discussed with the patient and family. After consideration of risks, benefits and other options for treatment, the patient has consented to  Procedure(s) (LRB): LEFT AND RIGHT HEART CATHETERIZATION WITH CORONARY ANGIOGRAM (N/A) as a surgical intervention .  The patient's history has been reviewed, patient examined, no change in status, stable for surgery.  I have reviewed the patients' chart and labs.  Questions were answered to the patient's satisfaction.     Tonny Bollman

## 2011-08-17 NOTE — H&P (View-Only) (Signed)
    Subjective:  Feels much better. No chest pain or dyspnea.  Objective:  Vital Signs in the last 24 hours: Temp:  [97.4 F (36.3 C)-97.9 F (36.6 C)] 97.9 F (36.6 C) (07/09 0503) Pulse Rate:  [96-103] 96  (07/09 0503) Resp:  [16-18] 18  (07/09 0503) BP: (121-131)/(88-94) 121/90 mmHg (07/09 0503) SpO2:  [94 %-98 %] 94 % (07/09 0503) Weight:  [85.7 kg (188 lb 15 oz)] 85.7 kg (188 lb 15 oz) (07/09 0500)  Intake/Output from previous day: 07/08 0701 - 07/09 0700 In: 1080 [P.O.:1080] Out: 2650 [Urine:2650]  Physical Exam: Pt is alert and oriented, NAD HEENT: normal Neck: JVP - normal, carotids 2+= without bruits Lungs: coarse breath sounds bilaterally CV: tachy and regular without murmur or gallop Abd: soft, NT, Positive BS, no hepatomegaly Ext: no C/C/E, distal pulses intact and equal Skin: warm/dry no rash   Lab Results:  Basename 08/14/11 1623  WBC 9.2  HGB 16.7  PLT 363    Basename 08/17/11 0455 08/16/11 0447  NA 136 139  K 4.1 3.9  CL 102 104  CO2 24 26  GLUCOSE 140* 140*  BUN 19 20  CREATININE 0.72 0.82    Basename 08/15/11 1200 08/15/11 0454  TROPONINI <0.30 <0.30    Cardiac Studies: Study Conclusions  - Left ventricle: The cavity size was mildly dilated. There was mild concentric hypertrophy. Systolic function was severely reduced. The estimated ejection fraction was in the range of 25% to 30%. Diffuse hypokinesis with asynchronous contraction. Doppler parameters are consistent with a reversible restrictive pattern, indicative of decreased left ventricular diastolic compliance and/or increased left atrial pressure (grade 3 diastolic dysfunction). - Mitral valve: Mild regurgitation.  Tele: sinus tach, no significant arrhythmia  Assessment/Plan:  Acute systolic CHF. Pt tolerating initial medical therapy with ACE/beta-blocker/diuretic. He is clinically much better. Plan right and left heart cath this am. He seems better compensated, although  still a bit tachycardic. Depending on cath findings, may be able to be discharged this afternoon with close outpatient follow-up.  Diabetes - per primary service  HTN - controlled on lisinopril and carvedilol. May add aldactone considering LVEF less than 35%.  Risks, indication, alternatives to cardiac cath and possible PCI reviewed in detail with the patient who understands and agrees to proceed.  Yilin Weedon, M.D. 08/17/2011, 6:54 AM     

## 2011-08-17 NOTE — Plan of Care (Signed)
Problem: Consults Goal: Diagnosis-Diabetes Mellitus Outcome: Progressing New Onset Type II      

## 2011-08-17 NOTE — Progress Notes (Signed)
Subjective: Feels much better. No chest pain or dyspnea   Objective: Vital signs in last 24 hours: Filed Vitals:   08/17/11 0503 08/17/11 0826 08/17/11 1006 08/17/11 1030  BP: 121/90 112/92 122/100 132/94  Pulse: 96 93  111  Temp: 97.9 F (36.6 C)   97.5 F (36.4 C)  TempSrc: Oral   Oral  Resp: 18   18  Height:      Weight:      SpO2: 94%   100%    Intake/Output Summary (Last 24 hours) at 08/17/11 1119 Last data filed at 08/17/11 0900  Gross per 24 hour  Intake    720 ml  Output   2650 ml  Net  -1930 ml    Weight change: -0.3 kg (-10.6 oz)  Pt is alert and oriented, NAD  HEENT: normal  Neck: JVP - normal, carotids 2+= without bruits  Lungs: coarse breath sounds bilaterally  CV: tachy and regular without murmur or gallop  Abd: soft, NT, Positive BS, no hepatomegaly  Ext: no C/C/E, distal pulses intact and equal  Skin: warm/dry no rash   Lab Results: Results for orders placed during the hospital encounter of 08/14/11 (from the past 24 hour(s))  GLUCOSE, CAPILLARY     Status: Abnormal   Collection Time   08/16/11 11:38 AM      Component Value Range   Glucose-Capillary 130 (*) 70 - 99 mg/dL  GLUCOSE, CAPILLARY     Status: Abnormal   Collection Time   08/16/11  3:17 PM      Component Value Range   Glucose-Capillary 183 (*) 70 - 99 mg/dL  GLUCOSE, CAPILLARY     Status: Abnormal   Collection Time   08/16/11  5:09 PM      Component Value Range   Glucose-Capillary 152 (*) 70 - 99 mg/dL  GLUCOSE, CAPILLARY     Status: Abnormal   Collection Time   08/16/11  8:34 PM      Component Value Range   Glucose-Capillary 107 (*) 70 - 99 mg/dL   Comment 1 Notify RN     Comment 2 Documented in Chart    BASIC METABOLIC PANEL     Status: Abnormal   Collection Time   08/17/11  4:55 AM      Component Value Range   Sodium 136  135 - 145 mEq/L   Potassium 4.1  3.5 - 5.1 mEq/L   Chloride 102  96 - 112 mEq/L   CO2 24  19 - 32 mEq/L   Glucose, Bld 140 (*) 70 - 99 mg/dL   BUN 19  6 - 23  mg/dL   Creatinine, Ser 4.54  0.50 - 1.35 mg/dL   Calcium 9.2  8.4 - 09.8 mg/dL   GFR calc non Af Amer >90  >90 mL/min   GFR calc Af Amer >90  >90 mL/min  PROTIME-INR     Status: Normal   Collection Time   08/17/11  4:55 AM      Component Value Range   Prothrombin Time 12.9  11.6 - 15.2 seconds   INR 0.95  0.00 - 1.49  GLUCOSE, CAPILLARY     Status: Abnormal   Collection Time   08/17/11  7:42 AM      Component Value Range   Glucose-Capillary 125 (*) 70 - 99 mg/dL     Micro: No results found for this or any previous visit (from the past 240 hour(s)).  Studies/Results: No results found.  Medications:  Scheduled  Meds:   . aspirin  324 mg Oral Pre-Cath  . aspirin EC  81 mg Oral Daily  . carvedilol  3.125 mg Oral BID WC  . furosemide  20 mg Oral Daily  . heparin subcutaneous  5,000 Units Subcutaneous Q8H  . insulin aspart  0-5 Units Subcutaneous QHS  . insulin aspart  0-9 Units Subcutaneous TID WC  . insulin glargine  40 Units Subcutaneous Daily  . lisinopril  40 mg Oral Daily  . living well with diabetes book   Does not apply Once  . potassium chloride  40 mEq Oral BID  . sodium chloride  3 mL Intravenous Q12H  . sodium chloride  3 mL Intravenous Q12H  . DISCONTD: enoxaparin (LOVENOX) injection  40 mg Subcutaneous Q24H  . DISCONTD: furosemide  40 mg Intravenous Q12H   Continuous Infusions:  PRN Meds:.sodium chloride, sodium chloride, acetaminophen, hydrALAZINE, nitroGLYCERIN, ondansetron (ZOFRAN) IV, sodium chloride, sodium chloride, zolpidem   Assessment: Active Problems:  HTN (hypertension), malignant  CHF, acute  Hyperglycemia  Hypokalemia   Plan: Acute systolic CHF. Pt tolerating initial medical therapy with ACE/beta-blocker/diuretic. He is clinically much better. Plan right and left heart cath today,  He seems better compensated, although still a bit tachycardic. Depending on cath findings, discharge in a.m.  Diabetes - new-onset diabetes patient has been  initiated on Lantus continue sliding scale insulin, patient would like to go home on oral hypoglycemics  HTN - controlled on lisinopril and carvedilol.  Add aldactone considering LVEF less than 35%.        LOS: 3 days   [facility] 08/17/2011, 11:19 AM

## 2011-08-17 NOTE — Progress Notes (Signed)
    Subjective:  Feels much better. No chest pain or dyspnea.  Objective:  Vital Signs in the last 24 hours: Temp:  [97.4 F (36.3 C)-97.9 F (36.6 C)] 97.9 F (36.6 C) (07/09 0503) Pulse Rate:  [96-103] 96  (07/09 0503) Resp:  [16-18] 18  (07/09 0503) BP: (121-131)/(88-94) 121/90 mmHg (07/09 0503) SpO2:  [94 %-98 %] 94 % (07/09 0503) Weight:  [85.7 kg (188 lb 15 oz)] 85.7 kg (188 lb 15 oz) (07/09 0500)  Intake/Output from previous day: 07/08 0701 - 07/09 0700 In: 1080 [P.O.:1080] Out: 2650 [Urine:2650]  Physical Exam: Pt is alert and oriented, NAD HEENT: normal Neck: JVP - normal, carotids 2+= without bruits Lungs: coarse breath sounds bilaterally CV: tachy and regular without murmur or gallop Abd: soft, NT, Positive BS, no hepatomegaly Ext: no C/C/E, distal pulses intact and equal Skin: warm/dry no rash   Lab Results:  Basename 08/14/11 1623  WBC 9.2  HGB 16.7  PLT 363    Basename 08/17/11 0455 08/16/11 0447  NA 136 139  K 4.1 3.9  CL 102 104  CO2 24 26  GLUCOSE 140* 140*  BUN 19 20  CREATININE 0.72 0.82    Basename 08/15/11 1200 08/15/11 0454  TROPONINI <0.30 <0.30    Cardiac Studies: Study Conclusions  - Left ventricle: The cavity size was mildly dilated. There was mild concentric hypertrophy. Systolic function was severely reduced. The estimated ejection fraction was in the range of 25% to 30%. Diffuse hypokinesis with asynchronous contraction. Doppler parameters are consistent with a reversible restrictive pattern, indicative of decreased left ventricular diastolic compliance and/or increased left atrial pressure (grade 3 diastolic dysfunction). - Mitral valve: Mild regurgitation.  Tele: sinus tach, no significant arrhythmia  Assessment/Plan:  Acute systolic CHF. Pt tolerating initial medical therapy with ACE/beta-blocker/diuretic. He is clinically much better. Plan right and left heart cath this am. He seems better compensated, although  still a bit tachycardic. Depending on cath findings, may be able to be discharged this afternoon with close outpatient follow-up.  Diabetes - per primary service  HTN - controlled on lisinopril and carvedilol. May add aldactone considering LVEF less than 35%.  Risks, indication, alternatives to cardiac cath and possible PCI reviewed in detail with the patient who understands and agrees to proceed.  Tonny Bollman, M.D. 08/17/2011, 6:54 AM

## 2011-08-18 DIAGNOSIS — I5021 Acute systolic (congestive) heart failure: Secondary | ICD-10-CM

## 2011-08-18 LAB — GLUCOSE, CAPILLARY: Glucose-Capillary: 121 mg/dL — ABNORMAL HIGH (ref 70–99)

## 2011-08-18 MED ORDER — CARVEDILOL 3.125 MG PO TABS: 3.1250 mg | ORAL_TABLET | Freq: Two times a day (BID) | ORAL | Status: DC

## 2011-08-18 MED ORDER — INSULIN GLARGINE 100 UNIT/ML ~~LOC~~ SOLN: 40.0000 [IU] | Freq: Every day | SUBCUTANEOUS | Status: DC

## 2011-08-18 MED ORDER — SPIRONOLACTONE 12.5 MG HALF TABLET: ORAL_TABLET | ORAL | Status: DC

## 2011-08-18 MED ORDER — FUROSEMIDE 20 MG PO TABS: 20.0000 mg | ORAL_TABLET | Freq: Every day | ORAL | Status: DC

## 2011-08-18 MED ORDER — NITROGLYCERIN 0.4 MG SL SUBL: 0.4000 mg | SUBLINGUAL_TABLET | SUBLINGUAL | Status: DC | PRN

## 2011-08-18 MED ORDER — ASPIRIN 81 MG PO TBEC: 81.0000 mg | DELAYED_RELEASE_TABLET | Freq: Every day | ORAL | Status: AC

## 2011-08-18 MED ORDER — LISINOPRIL 40 MG PO TABS: 40.0000 mg | ORAL_TABLET | Freq: Every day | ORAL | Status: DC

## 2011-08-18 NOTE — Progress Notes (Signed)
    Subjective:  No chest pain or dyspnea. Eager to go home.  Objective:  Vital Signs in the last 24 hours: Temp:  [97.3 F (36.3 C)-97.9 F (36.6 C)] 97.3 F (36.3 C) (07/10 0456) Pulse Rate:  [90-111] 95  (07/10 0456) Resp:  [16-18] 18  (07/10 0456) BP: (104-132)/(65-100) 106/73 mmHg (07/10 0456) SpO2:  [92 %-100 %] 96 % (07/10 0456) Weight:  [85.3 kg (188 lb 0.8 oz)] 85.3 kg (188 lb 0.8 oz) (07/10 0456)  Intake/Output from previous day: 07/09 0701 - 07/10 0700 In: 989.2 [P.O.:900; I.V.:89.2] Out: 1650 [Urine:1650]  Physical Exam: Pt is alert and oriented, NAD HEENT: normal Neck: JVP - normal, carotids 2+= without bruits Lungs: CTA bilaterally CV: RRR without murmur or gallop Abd: soft, NT, Positive BS, no hepatomegaly Ext: no C/C/E, right groin is clear Skin: warm/dry no rash   Lab Results:  Basename 08/17/11 1516  WBC 9.1  HGB 14.7  PLT 313    Basename 08/17/11 1516 08/17/11 0455 08/16/11 0447  NA -- 136 139  K -- 4.1 3.9  CL -- 102 104  CO2 -- 24 26  GLUCOSE -- 140* 140*  BUN -- 19 20  CREATININE 0.67 0.72 --    Basename 08/15/11 1200  TROPONINI <0.30   Tele: sinus rhythm, sinus tach  Assessment/Plan:  1. Acute systolic CHF with severely reduced LVEF, nonischemic CM. Cath yesterday with compensated hemodynamics and nonobstructive CAD. He is ready for discharge home. D/C this am on carvedilol 3.125 mg, lasix 20 mg, lisinopril 40 mg, aldactone 12.5 mg. Would stop KCL since aldactone has been started. Will arrange follow-up in the advanced heart failure clinic and he should have a BMET next week.  2. Malignant HTN with CHF - previously untreated. Good control on meds as above.  3. CAD - nonobstructive. ASA 81 mg daily.  4. Hyperlipidemia - LDL greater than 100 mg/dL. Start pravachol 40 mg daily.  Tonny Bollman, M.D. 08/18/2011, 7:22 AM

## 2011-08-18 NOTE — Discharge Summary (Signed)
Physician Discharge Summary  Dustin Lozano:[id number] DOB: 04-30-1960 DOA: 08/14/2011  PCP: No primary provider on file.  Admit date: 08/14/2011 Discharge date: 08/18/2011  Recommendations for Outpatient Follow-up:  1. Primary care physician Dr. Orvan Falconer - Alita Chyle as scheduled on 7/12 2. CHF clinic as scheduled per cardiology Discharge Diagnoses:  Active Problems:  HTN (hypertension), malignant  Systolic CHF, acute/nonischemic cardiomyopathy  Hyperglycemia/diabetes mellitus, type II-new onset  Hypokalemia   Discharge Condition: Improved/stable  Diet recommendation: Low Na heart healthy  History of present illness:  Dustin Lozano is an 51 y.o. male. Middle-aged Caucasian gentleman, no medical followup for hypertension for the past 6 years, has lost over 70 pounds by deliberate diet and exercise over this period, and has considered himself in good health, until he developed progressive he is shortness of breath with exertion, and lying flat, and difficulty sleeping because of shortness of breath while lying down, all over the past 4 days. Denies leg edema, denies hemoptysis.  Quit smoking 5 years ago; drinks 8-12 glasses of water daily as a part of his health regimen.  Denies palpitations, chest pain, black or bloody stool, nausea or vomiting, fever cough or cold.  Denies alcohol abuse or abdominal distention.  Had upper respiratory infection about 6 weeks ago, and it persisted for about 2 weeks.  Denies diuretic use.   Hospital Course by problem list:  Active Problems:  HTN (hypertension), malignant  Systolic CHF, acute/nonischemic cardiomyopathy  Hyperglycemia/diabetes mellitus, type II-new onset  Hypokalemia Acute systolic CHF. The patient presented with shortness of breath and chest x-ray revealed bilateral small effusions and interstitial edema which was mild, he was also found to have an elevated brain natruretic peptide. He was started on diuresis with Lasix. A 2-D  echocardiogram was done and a showed an ejection fraction of any 5-30%. Spironolactone was added to his treatment regimen. Cardiac enzymes were cycled and were negative. Cardiology was consulted saw patient he was placed on Coreg. A cardiac cath was done on 79 which showed an ejection fraction of 20% with severe global hypo-kinesis. Mild Nonobstructive coronary artery disease with a 40-50% stenosis in the mid LAD and minimal stenosis in the left complex and nonobstructive disease in the RCA with fairly well compensated hemodynamics. Cardiology recommended medical therapy for nonischemic cardiomyopathy. With the above treatments he improved clinically, Dr. Excell Seltzer followed up with and from his standpoint patient okay for discharge and he is to followup in the CHF clinic as directed. Diabetes - new-onset diabetes  The patient's blood sugars were elevated on admission, a hemoglobin A1c was done and came back elevated at 8.7. He was started on Lantus with blood glucose control improved. he'll be discharged on on Lantus, his to followup with his primary care physician on 7/12 as above,  he states he would prefer to be on oral hypoglycemics longterm, and is to followup with his primary care physician for further monitoring and switching to an oral regimen when appropriate per his PCP. HTN  -Upon admission the patient was placed on IV hydralazine as needed for blood pressure controll and lisinopril was started as well. -As discussed above he was diuresed with Lasix for congestive heart failure, and cardiology followed and patient was started on carvedilol - Also subsequently Add aldactone considering LVEF less than 35%.  -On the above oral regimen his blood pressure was much better controlled, and he is to continue this by mouth meds upon discharge and followup with his primary care physician and our cardiology.  Procedures:  2-D echo Study Conclusions  - Left ventricle: The cavity size was mildly dilated.  There was mild concentric hypertrophy. Systolic function was severely reduced. The estimated ejection fraction was in the range of 25% to 30%. Diffuse hypokinesis with asynchronous contraction. Doppler parameters are consistent with a reversible restrictive pattern, indicative of decreased left ventricular diastolic compliance and/or increased left atrial pressure (grade 3 diastolic dysfunction). - Mitral valve: Mild regurgitation. Transthoracic echocardiography.  Cardiac cath Final Conclusions:  1. Mild nonobstructive coronary artery disease with a 40-50% stenosis in the mid LAD, minimal stenosis in the left circumflex, and nonobstructive disease in the RCA as described above  2. Severe global left ventricular systolic dysfunction  3. Fairly well compensated hemodynamics as above  Recommendations: medical therapy for nonischemic cardiomyopathy.   Consultations:  Cardiology-Dr. Excell Seltzer  Discharge Exam: Filed Vitals:   08/18/11 0935  BP: 111/78  Pulse: 95  Temp: 97.5 F (36.4 C)  Resp: 18   Filed Vitals:   08/17/11 2145 08/18/11 0228 08/18/11 0456 08/18/11 0935  BP: 104/65 117/83 106/73 111/78  Pulse: 99 90 95 95  Temp: 97.7 F (36.5 C) 97.6 F (36.4 C) 97.3 F (36.3 C) 97.5 F (36.4 C)  TempSrc: Oral Oral Oral Oral  Resp: 18 18 18 18   Height:      Weight:   85.3 kg (188 lb 0.8 oz)   SpO2: 95% 93% 96% 97%   General: Middle aged male, alert and oriented x3 in no apparent distress. Cardiovascular: Regular rate and rhythm normal S1-S2 Respiratory: Decreased breath sounds at the bases, otherwise clear Extremities: No cyanosis and no edema Discharge Instructions  Discharge Orders    Future Appointments: Provider: Department: Dept Phone: Center:   08/20/2011 10:00 AM Baker Pierini, FNP [facility] [phone] [facility]     Future Orders Please Complete By Expires   Diet Carb Modified      Increase activity slowly        Medication List  As of 08/18/2011   1:02 PM   TAKE these medications         aspirin 81 MG EC tablet   Take 1 tablet (81 mg total) by mouth daily.      carvedilol 3.125 MG tablet   Commonly known as: COREG   Take 1 tablet (3.125 mg total) by mouth 2 (two) times daily with a meal.      furosemide 20 MG tablet   Commonly known as: LASIX   Take 1 tablet (20 mg total) by mouth daily.      insulin glargine 100 UNIT/ML injection   Commonly known as: LANTUS   Inject 40 Units into the skin daily.      lisinopril 40 MG tablet   Commonly known as: PRINIVIL,ZESTRIL   Take 1 tablet (40 mg total) by mouth daily.      multivitamin with minerals Tabs   Take 1 tablet by mouth daily.      nitroGLYCERIN 0.4 MG SL tablet   Commonly known as: NITROSTAT   Place 1 tablet (0.4 mg total) under the tongue every 5 (five) minutes as needed for chest pain (CP or SOB).      spironolactone 12.5 mg Tabs   Commonly known as: ALDACTONE   Take 1 Tablet daily           Follow-up Information    Follow up with CAMPBELL, PADONDA BOYD, FNP on 08/20/2011. (@ 10a)    Contact information:   [address] [place] @  [place] [address] [phone]           The results of significant diagnostics from this hospitalization (including imaging, microbiology, ancillary and laboratory) are listed below for reference.    Significant Diagnostic Studies: Dg Chest 2 View  08/14/2011  *RADIOLOGY REPORT*  Clinical Data: Short of breath  CHEST - 2 VIEW  Comparison: Chest radiograph 10/24/2003  Findings: Normal cardiac silhouette.  There is increased bilateral pleural effusions compared to prior.  These are small in volume. There is a mild interstitial edema pattern.  There is central bronchitic markings are similar to prior.  No focal consolidation. No pneumothorax.  IMPRESSION: Bilateral small effusions interstitial edema which is mild.  Original Report Authenticated By: Genevive Bi, M.D.    Microbiology: No results  found for this or any previous visit (from the past 240 hour(s)).   Labs: Basic Metabolic Panel:  Lab 08/17/11 9147 08/17/11 0455 08/16/11 0447 08/15/11 0454 08/14/11 2255 08/14/11 1625 08/14/11 1623  NA -- 136 139 136 -- -- 138  K -- 4.1 3.9 3.3* -- -- 3.2*  CL -- 102 104 101 -- -- 101  CO2 -- 24 26 24  -- -- 23  GLUCOSE -- 140* 140* 245* 424* -- 155*  BUN -- 19 20 16  -- -- 11  CREATININE 0.67 0.72 0.82 0.64 -- -- 0.67  CALCIUM -- 9.2 9.3 9.5 -- -- 9.8  MG -- -- -- 1.9 -- 2.0 --  PHOS -- -- -- -- -- -- --   Liver Function Tests: No results found for this basename: AST:5,ALT:5,ALKPHOS:5,BILITOT:5,PROT:5,ALBUMIN:5 in the last 168 hours No results found for this basename: LIPASE:5,AMYLASE:5 in the last 168 hours No results found for this basename: AMMONIA:5 in the last 168 hours CBC:  Lab 08/17/11 1516 08/14/11 1623  WBC 9.1 9.2  NEUTROABS -- --  HGB 14.7 16.7  HCT 43.2 46.8  MCV 81.5 80.0  PLT 313 363   Cardiac Enzymes:  Lab 08/15/11 1200 08/15/11 0454 08/14/11 2011  CKTOTAL 41 34 48  CKMB 2.2 1.3 1.8  CKMBINDEX -- -- --  TROPONINI <0.30 <0.30 <0.30   BNP: BNP (last 3 results)  Basename 08/14/11 1623  PROBNP 1396.0*   CBG:  Lab 08/18/11 1133 08/18/11 0706 08/17/11 2133 08/17/11 1700 08/17/11 0742  GLUCAP 93 121* 133* 220* 125*    Time coordinating discharge: 45 minutes  Signed:  Teonia Yager C  [facility] 08/18/2011, 1:02 PM

## 2011-08-20 ENCOUNTER — Ambulatory Visit (INDEPENDENT_AMBULATORY_CARE_PROVIDER_SITE_OTHER): Payer: 59 | Admitting: Family

## 2011-08-20 ENCOUNTER — Encounter: Payer: Self-pay | Admitting: Family

## 2011-08-20 VITALS — BP 108/80 | Ht 72.0 in | Wt 189.0 lb

## 2011-08-20 DIAGNOSIS — I10 Essential (primary) hypertension: Secondary | ICD-10-CM

## 2011-08-20 DIAGNOSIS — E876 Hypokalemia: Secondary | ICD-10-CM

## 2011-08-20 DIAGNOSIS — E119 Type 2 diabetes mellitus without complications: Secondary | ICD-10-CM

## 2011-08-20 LAB — BASIC METABOLIC PANEL
CO2: 28 mEq/L (ref 19–32)
Chloride: 101 mEq/L (ref 96–112)
Creatinine, Ser: 0.7 mg/dL (ref 0.4–1.5)
Potassium: 4.3 mEq/L (ref 3.5–5.1)

## 2011-08-20 LAB — GLUCOSE, CAPILLARY: Glucose-Capillary: 121 mg/dL — ABNORMAL HIGH (ref 70–99)

## 2011-08-20 MED ORDER — GLIPIZIDE 5 MG PO TABS: 5.0000 mg | ORAL_TABLET | Freq: Two times a day (BID) | ORAL | Status: DC

## 2011-08-20 MED ORDER — ESZOPICLONE 2 MG PO TABS: 2.0000 mg | ORAL_TABLET | Freq: Every day | ORAL | Status: DC

## 2011-08-20 NOTE — Progress Notes (Signed)
Subjective:    Patient ID: Dustin Lozano, male    DOB: 06-Jun-1960, 51 y.o.   MRN: [id number]  HPI 51 year old white male, new patient to the practice, is in to be established after being diagnosed with congestive heart failure, type 2 diabetes, malignant hypertension, and hypokalemia. He was recently discharged from the hospital. He is due to see cardiology next week. In the last 2 years he has lost 75 pounds but has a significant family history of type 2 diabetes. Patient denies any increase in urination, or thirst, lightheadedness, dizziness, chest pain, palpitations, shortness of breath or edema. Total cholesterol was 174, LDL 114. He is currently on medications and is tolerating them well. Patient will like to discontinue insulin. He has not taken any insulin since he left the hospital. He is also requesting not to go on metformin due to the possible GI side effects.    Review of Systems  Constitutional: Negative.   HENT: Negative.   Respiratory: Negative.   Cardiovascular: Negative.   Gastrointestinal: Negative.   Genitourinary: Negative.   Musculoskeletal: Negative.   Skin: Negative.   Neurological: Negative.   Hematological: Negative.   Psychiatric/Behavioral: Negative.    Past Medical History  Diagnosis Date  . HTN (hypertension)   . Hyperglycemia     History   Social History  . Marital Status: Single    Spouse Name: N/A    Number of Children: N/A  . Years of Education: N/A   Occupational History  . salesman    Social History Main Topics  . Smoking status: Former Smoker -- 0.7 packs/day for 15 years    Quit date: 02/08/2006  . Smokeless tobacco: Not on file  . Alcohol Use: No  . Drug Use: No  . Sexually Active: Not on file   Other Topics Concern  . Not on file   Social History Narrative  . No narrative on file    Past Surgical History  Procedure Date  . Appendectomy     Family History  Problem Relation Age of Onset  . Diabetes Father   . Heart  failure Father   . Cancer Mother     lymphoma  . Diabetes Mother     No Known Allergies  Current Outpatient Prescriptions on File Prior to Visit  Medication Sig Dispense Refill  . aspirin EC 81 MG EC tablet Take 1 tablet (81 mg total) by mouth daily.  30 tablet  0  . carvedilol (COREG) 3.125 MG tablet Take 1 tablet (3.125 mg total) by mouth 2 (two) times daily with a meal.  60 tablet  0  . furosemide (LASIX) 20 MG tablet Take 1 tablet (20 mg total) by mouth daily.  30 tablet  0  . insulin glargine (LANTUS) 100 UNIT/ML injection Inject 40 Units into the skin daily.  10 mL  0  . lisinopril (PRINIVIL,ZESTRIL) 40 MG tablet Take 1 tablet (40 mg total) by mouth daily.  30 tablet  0  . Multiple Vitamin (MULTIVITAMIN WITH MINERALS) TABS Take 1 tablet by mouth daily.      . nitroGLYCERIN (NITROSTAT) 0.4 MG SL tablet Place 1 tablet (0.4 mg total) under the tongue every 5 (five) minutes as needed for chest pain (CP or SOB).  20 tablet  0  . spironolactone (ALDACTONE) 12.5 mg TABS Take 1 Tablet daily  30 tablet  0  . glipiZIDE (GLUCOTROL) 5 MG tablet Take 1 tablet (5 mg total) by mouth 2 (two) times daily before a meal.  60 tablet  3    BP 108/80  Ht 6' (1.829 m)  Wt 189 lb (85.73 kg)  BMI 25.63 kg/m2chart       Plan: BMP sent. Assessment:ctive:   Physical Exam  Constitutional: He is oriented to person, place, and time. He appears well-developed and well-nourished.  HENT:  Right Ear: External ear normal.  Left Ear: External ear normal.  Nose: Nose normal.  Mouth/Throat: Oropharynx is clear and moist.  Eyes: Conjunctivae and EOM are normal. Pupils are equal, round, and reactive to light.  Neck: Normal range of motion. Neck supple.  Cardiovascular: Normal rate, regular rhythm and normal heart sounds.   Pulmonary/Chest: Effort normal and breath sounds normal.  Abdominal: Soft.  Musculoskeletal: Normal range of motion.  Neurological: He is alert and oriented to person, place, and time.    Skin: Skin is warm and dry.  Psychiatric: He has a normal mood and affect.          Assessment & Plan:  Assessment: Type 2 diabetes, CHF, Hypertension, Hypokalemia  Plan: Metformin suggested but patient declines. Start Glipizide 5mg  twice daily. BMP sent. Glucometer provided patient will check his blood sugar fasting and postprandial, maintaining a diary. He will bring those back to his office visit in one month. Followup with cardiology as scheduled. Continue current medications. DC insulin. Call the office with any questions or concerns.

## 2011-08-20 NOTE — Addendum Note (Signed)
Addended by: Beverely Low on: 08/20/2011 12:00 PM   Modules accepted: Orders

## 2011-08-20 NOTE — Patient Instructions (Addendum)
Diabetes and Exercise Regular exercise is important and can help:   Control blood glucose (sugar).   Decrease blood pressure.    Control blood lipids (cholesterol, triglycerides).   Improve overall health.  BENEFITS FROM EXERCISE  Improved fitness.   Improved flexibility.   Improved endurance.   Increased bone density.   Weight control.   Increased muscle strength.   Decreased body fat.   Improvement of the body's use of insulin, a hormone.   Increased insulin sensitivity.   Reduction of insulin needs.   Reduced stress and tension.   Helps you feel better.  People with diabetes who add exercise to their lifestyle gain additional benefits, including:  Weight loss.   Reduced appetite.   Improvement of the body's use of blood glucose.   Decreased risk factors for heart disease:   Lowering of cholesterol and triglycerides.   Raising the level of good cholesterol (high-density lipoproteins, HDL).   Lowering blood sugar.   Decreased blood pressure.  TYPE 1 DIABETES AND EXERCISE  Exercise will usually lower your blood glucose.   If blood glucose is greater than 240 mg/dl, check urine ketones. If ketones are present, do not exercise.   Location of the insulin injection sites may need to be adjusted with exercise. Avoid injecting insulin into areas of the body that will be exercised. For example, avoid injecting insulin into:   The arms when playing tennis.   The legs when jogging. For more information, discuss this with your caregiver.   Keep a record of:   Food intake.   Type and amount of exercise.   Expected peak times of insulin action.   Blood glucose levels.  Do this before, during, and after exercise. Review your records with your caregiver. This will help you to develop guidelines for adjusting food intake and insulin amounts.  TYPE 2 DIABETES AND EXERCISE  Regular physical activity can help control blood glucose.   Exercise is important  because it may:   Increase the body's sensitivity to insulin.   Improve blood glucose control.   Exercise reduces the risk of heart disease. It decreases serum cholesterol and triglycerides. It also lowers blood pressure.   Those who take insulin or oral hypoglycemic agents should watch for signs of hypoglycemia. These signs include dizziness, shaking, sweating, chills, and confusion.   Body water is lost during exercise. It must be replaced. This will help to avoid loss of body fluids (dehydration) or heat stroke.  Be sure to talk to your caregiver before starting an exercise program to make sure it is safe for you. Remember, any activity is better than none.  Document Released: 04/17/2003 Document Revised: 01/14/2011 Document Reviewed: 08/01/2008 Exit[employer] Patient Information 2012 [employer].   Diabetes Meal Planning Guide The diabetes meal planning guide is a tool to help you plan your meals and snacks. It is important for people with diabetes to manage their blood glucose (sugar) levels. Choosing the right foods and the right amounts throughout your day will help control your blood glucose. Eating right can even help you improve your blood pressure and reach or maintain a healthy weight. CARBOHYDRATE COUNTING MADE EASY When you eat carbohydrates, they turn to sugar. This raises your blood glucose level. Counting carbohydrates can help you control this level so you feel better. When you plan your meals by counting carbohydrates, you can have more flexibility in what you eat and balance your medicine with your food intake. Carbohydrate counting simply means adding up the total   amount of carbohydrate grams in your meals and snacks. Try to eat about the same amount at each meal. Foods with carbohydrates are listed below. Each portion below is 1 carbohydrate serving or 15 grams of carbohydrates. Ask your dietician how many grams of carbohydrates you should eat at each meal or snack. Grains  and Starches 1 slice bread.   English muffin or hotdog/hamburger bun.   cup cold cereal (unsweetened).  ? cup cooked pasta or rice.   cup starchy vegetables (corn, potatoes, peas, beans, winter squash).  1 tortilla (6 inches).   bagel.  1 waffle or pancake (size of a CD).   cup cooked cereal.  4 to 6 small crackers.  *Whole grain is recommended. Fruit 1 cup fresh unsweetened berries, melon, papaya, pineapple.  1 small fresh fruit.   banana or mango.   cup fruit juice (4 oz unsweetened).   cup canned fruit in natural juice or water.  2 tbs dried fruit.  12 to 15 grapes or cherries.  Milk and Yogurt 1 cup fat-free or 1% milk.  1 cup soy milk.  6 oz light yogurt with sugar-free sweetener.  6 oz low-fat soy yogurt.  6 oz plain yogurt.  Vegetables 1 cup raw or  cup cooked is counted as 0 carbohydrates or a "free" food.  If you eat 3 or more servings at 1 meal, count them as 1 carbohydrate serving.  Other Carbohydrates  oz chips or pretzels.   cup ice cream or frozen yogurt.   cup sherbet or sorbet.  2 inch square cake, no frosting.  1 tbs honey, sugar, jam, jelly, or syrup.  2 small cookies.  3 squares of graham crackers.  3 cups popcorn.  6 crackers.  1 cup broth-based soup.  Count 1 cup casserole or other mixed foods as 2 carbohydrate servings.  Foods with less than 20 calories in a serving may be counted as 0 carbohydrates or a "free" food.  You may want to purchase a book or computer software that lists the carbohydrate gram counts of different foods. In addition, the nutrition facts panel on the labels of the foods you eat are a good source of this information. The label will tell you how big the serving size is and the total number of carbohydrate grams you will be eating per serving. Divide this number by 15 to obtain the number of carbohydrate servings in a portion. Remember, 1 carbohydrate serving equals 15 grams of carbohydrate. SERVING SIZES Measuring  foods and serving sizes helps you make sure you are getting the right amount of food. The list below tells how big or small some common serving sizes are. 1 oz.........4 stacked dice.  3 oz.........Deck of cards.  1 tsp........Tip of little finger.  1 tbs........Thumb.  2 tbs........Golf ball.   cup.......Half of a fist.  1 cup........A fist.  SAMPLE DIABETES MEAL PLAN Below is a sample meal plan that includes foods from the grain and starches, dairy, vegetable, fruit, and meat groups. A dietician can individualize a meal plan to fit your calorie needs and tell you the number of servings needed from each food group. However, controlling the total amount of carbohydrates in your meal or snack is more important than making sure you include all of the food groups at every meal. You may interchange carbohydrate containing foods (dairy, starches, and fruits). The meal plan below is an example of a 2000 calorie diet using carbohydrate counting. This meal plan has 17 carbohydrate servings. Breakfast 1   cup oatmeal (2 carb servings).   cup light yogurt (1 carb serving).  1 cup blueberries (1 carb serving).   cup almonds.  Snack 1 large apple (2 carb servings).  1 low-fat string cheese stick.  Lunch Chicken breast salad.  1 cup spinach.   cup chopped tomatoes.  2 oz chicken breast, sliced.  2 tbs low-fat Italian dressing.  12 whole-wheat crackers (2 carb servings).  12 to 15 grapes (1 carb serving).  1 cup low-fat milk (1 carb serving).  Snack 1 cup carrots.   cup hummus (1 carb serving).  Dinner 3 oz broiled salmon.  1 cup brown rice (3 carb servings).  Snack 1  cups steamed broccoli (1 carb serving) drizzled with 1 tsp olive oil and lemon juice.  1 cup light pudding (2 carb servings).  DIABETES MEAL PLANNING WORKSHEET Your dietician can use this worksheet to help you decide how many servings of foods and what types of foods are right for you.  BREAKFAST Food Group and Servings /  Carb Servings Grain/Starches __________________________________ Dairy __________________________________________ Vegetable ______________________________________ Fruit ___________________________________________ Meat __________________________________________ Fat ____________________________________________ LUNCH Food Group and Servings / Carb Servings Grain/Starches ___________________________________ Dairy ___________________________________________ Fruit ____________________________________________ Meat ___________________________________________ Fat _____________________________________________ DINNER Food Group and Servings / Carb Servings Grain/Starches ___________________________________ Dairy ___________________________________________ Fruit ____________________________________________ Meat ___________________________________________ Fat _____________________________________________ SNACKS Food Group and Servings / Carb Servings Grain/Starches ___________________________________ Dairy ___________________________________________ Vegetable _______________________________________ Fruit ____________________________________________ Meat ___________________________________________ Fat _____________________________________________ DAILY TOTALS Starches _________________________ Vegetable ________________________ Fruit ____________________________ Dairy ____________________________ Meat ____________________________ Fat ______________________________ Document Released: 10/22/2004 Document Revised: 01/14/2011 Document Reviewed: 09/02/2008 Exit[employer] Patient Information 2012 [employer].    

## 2011-08-23 LAB — POCT I-STAT 3, ART BLOOD GAS (G3+)
Bicarbonate: 23.7 mEq/L (ref 20.0–24.0)
TCO2: 25 mmol/L (ref 0–100)
pCO2 arterial: 39.1 mmHg (ref 35.0–45.0)
pH, Arterial: 7.391 (ref 7.350–7.450)
pO2, Arterial: 73 mmHg — ABNORMAL LOW (ref 80.0–100.0)

## 2011-08-23 LAB — POCT I-STAT 3, VENOUS BLOOD GAS (G3P V)
Acid-base deficit: 3 mmol/L — ABNORMAL HIGH (ref 0.0–2.0)
Bicarbonate: 22.5 mEq/L (ref 20.0–24.0)
O2 Saturation: 66 %
O2 Saturation: 70 %
TCO2: 24 mmol/L (ref 0–100)
pCO2, Ven: 41.1 mmHg — ABNORMAL LOW (ref 45.0–50.0)
pH, Ven: 7.371 — ABNORMAL HIGH (ref 7.250–7.300)
pO2, Ven: 35 mmHg (ref 30.0–45.0)
pO2, Ven: 37 mmHg (ref 30.0–45.0)

## 2011-08-24 ENCOUNTER — Encounter (HOSPITAL_COMMUNITY): Payer: Self-pay

## 2011-08-24 ENCOUNTER — Ambulatory Visit (HOSPITAL_COMMUNITY)
Admission: RE | Admit: 2011-08-24 | Discharge: 2011-08-24 | Disposition: A | Discharge status: Home / Self Care | Payer: 59 | Source: Ambulatory Visit | Attending: Internal Medicine | Admitting: Internal Medicine

## 2011-08-24 VITALS — BP 108/70 | HR 91 | Wt 185.8 lb

## 2011-08-24 DIAGNOSIS — I5022 Chronic systolic (congestive) heart failure: Secondary | ICD-10-CM | POA: Insufficient documentation

## 2011-08-24 DIAGNOSIS — Z87891 Personal history of nicotine dependence: Secondary | ICD-10-CM | POA: Insufficient documentation

## 2011-08-24 DIAGNOSIS — Z807 Family history of other malignant neoplasms of lymphoid, hematopoietic and related tissues: Secondary | ICD-10-CM | POA: Insufficient documentation

## 2011-08-24 DIAGNOSIS — I428 Other cardiomyopathies: Secondary | ICD-10-CM | POA: Insufficient documentation

## 2011-08-24 DIAGNOSIS — I509 Heart failure, unspecified: Secondary | ICD-10-CM | POA: Insufficient documentation

## 2011-08-24 DIAGNOSIS — E119 Type 2 diabetes mellitus without complications: Secondary | ICD-10-CM | POA: Insufficient documentation

## 2011-08-24 DIAGNOSIS — I10 Essential (primary) hypertension: Secondary | ICD-10-CM | POA: Insufficient documentation

## 2011-08-24 DIAGNOSIS — I2510 Atherosclerotic heart disease of native coronary artery without angina pectoris: Secondary | ICD-10-CM | POA: Insufficient documentation

## 2011-08-24 DIAGNOSIS — Z833 Family history of diabetes mellitus: Secondary | ICD-10-CM | POA: Insufficient documentation

## 2011-08-24 DIAGNOSIS — Z7982 Long term (current) use of aspirin: Secondary | ICD-10-CM | POA: Insufficient documentation

## 2011-08-24 DIAGNOSIS — Z8249 Family history of ischemic heart disease and other diseases of the circulatory system: Secondary | ICD-10-CM | POA: Insufficient documentation

## 2011-08-24 HISTORY — DX: Heart failure, unspecified: I50.9

## 2011-08-24 HISTORY — DX: Atherosclerotic heart disease of native coronary artery without angina pectoris: I25.10

## 2011-08-24 LAB — BASIC METABOLIC PANEL
BUN: 15 mg/dL (ref 6–23)
Calcium: 9.9 mg/dL (ref 8.4–10.5)
GFR calc Af Amer: >90 mL/min (ref >90)
GFR calc non Af Amer: >90 mL/min (ref >90)
Glucose, Bld: 98 mg/dL (ref 70–99)
Sodium: 139 mEq/L (ref 135–145)

## 2011-08-24 MED ORDER — FUROSEMIDE 20 MG PO TABS: ORAL_TABLET | ORAL | Status: DC

## 2011-08-24 MED ORDER — LOSARTAN POTASSIUM 50 MG PO TABS: 50.0000 mg | ORAL_TABLET | Freq: Every day | ORAL | Status: DC

## 2011-08-24 NOTE — Progress Notes (Signed)
Primary Care: [facility] - Dr. Orvan Falconer Primary Cardiologist:   Weight Range: 185-188 pounds Baseline proBNP: 1396 08/14/11  HPI:  Dustin Lozano is a 51 y.o. gentlemen with recent diagnosis of systolic heart failure due to nonischemic cardiomyopathy with an EF 25-30%, DM2 and HTN.  He underwent cardiac catheterization on 08/17/11 showing mild nonobstructive coronary artery disease with a 40-50% stenosis in the mid LAD, minimal stenosis in the left circumflex, and nonobstructive disease in the RCA.  Of note, over the last 2 years he has lost 75 pounds.  Admitted to [facility] 7/6-7/10 due to progressive dyspnea and found to have newly diagnosed cardiomyopathy.  EF 25-30% by echo with diffuse hypokinesis.  He was diuresed and discharged on carvedilol 3.125 mg, lasix 20 mg, lisinopril 40 mg, aldactone 12.5 mg.  RHC 08/17/11 RA 7  RV 36/5  PA 40/17 mean 28  PCWP 22  LV 101/19  AO 102/73  Oxygen saturations:  PA 70  SVC 66  AO 94  Cardiac Output (Fick) 4.4 L/m  Cardiac Index (Fick) 2.1 L/m/m2   He is here to establish care in the HF clinic.  He says he feels well since discharge.  He denies SOB/orthopnea/PND.  He denies lower extremity edema.  He says that in the hospital his glucose was running 300s and now on glipizide it is around 70-100.  He does endorse dizziness that is not orthostatic.  He has not tested his blood sugars when he feels dizzy.  Dizziness comes and goes throughout the day.  No syncope.  He does have a dry hacky cough.  He has yellow sputum.  No fevers. He has started walking on the treadmill 20 min a day this week without difficulty.     Review of Systems: [y] = yes, [ ]  = no   General: Weight gain [ ] ; Weight loss [ ] ; Anorexia [ ] ; Fatigue [ ] ; Fever [ ] ; Chills [ ] ; Weakness [ ]   Cardiac: Chest pain/pressure [ ] ; Resting SOB [ ] ; Exertional SOB [ ] ; Orthopnea [ ] ; Pedal Edema [ ] ; Palpitations [ ] ; Syncope [ ] ; Presyncope [ ] ; Paroxysmal nocturnal dyspnea[ ]   Pulmonary:  Cough [y]; Wheezing[ ] ; Hemoptysis[ ] ; Sputum [name] ]; Snoring [ ]   GI: Vomiting[ ] ; Dysphagia[ ] ; Melena[ ] ; Hematochezia [ ] ; Heartburn[ ] ; Abdominal pain [ ] ; Constipation [ ] ; Diarrhea [ ] ; BRBPR [ ]   GU: Hematuria[ ] ; Dysuria [ ] ; Nocturia[ ]   Vascular: Pain in legs with walking [ ] ; Pain in feet with lying flat [ ] ; Non-healing sores [ ] ; Stroke [ ] ; TIA [ ] ; Slurred speech [ ] ;  Neuro: Headaches[ ] ; Vertigo[ ] ; Seizures[ ] ; Paresthesias[ ] ;Blurred vision [ ] ; Diplopia [ ] ; Vision changes [ ]   Ortho/Skin: Arthritis [ ] ; Joint pain [ ] ; Muscle pain [ ] ; Joint swelling [ ] ; Back Pain [ ] ; Rash [ ]   Psych: Depression[ ] ; Anxiety[ ]   Heme: Bleeding problems [ ] ; Clotting disorders [ ] ; Anemia [ ]   Endocrine: Diabetes [ y]; Thyroid dysfunction[ ]    Past Medical History  Diagnosis Date  . HTN (hypertension)   . Diabetes mellitus   . CHF (congestive heart failure)     Systolic - LVEF 25-30%  (08/2011)  . Coronary artery disease     Nonobstructive disease per cath 08/2011    Current Outpatient Prescriptions  Medication Sig Dispense Refill  . aspirin EC 81 MG EC tablet Take 1 tablet (81 mg total) by mouth daily.  30 tablet  0  . carvedilol (COREG) 3.125 MG tablet Take 1 tablet (3.125 mg total) by mouth 2 (two) times daily with a meal.  60 tablet  0  . eszopiclone (LUNESTA) 2 MG TABS Take 1 tablet (2 mg total) by mouth at bedtime. Take immediately before bedtime  30 tablet  2  . furosemide (LASIX) 20 MG tablet Take 1 tablet (20 mg total) by mouth daily.  30 tablet  0  . glipiZIDE (GLUCOTROL) 5 MG tablet Take 1 tablet (5 mg total) by mouth 2 (two) times daily before a meal.  60 tablet  3  . lisinopril (PRINIVIL,ZESTRIL) 40 MG tablet Take 1 tablet (40 mg total) by mouth daily.  30 tablet  0  . Multiple Vitamin (MULTIVITAMIN WITH MINERALS) TABS Take 1 tablet by mouth daily.      . nitroGLYCERIN (NITROSTAT) 0.4 MG SL tablet Place 1 tablet (0.4 mg total) under the tongue every 5 (five) minutes as  needed for chest pain (CP or SOB).  20 tablet  0  . spironolactone (ALDACTONE) 12.5 mg TABS Take 1 Tablet daily  30 tablet  0    No Known Allergies  History   Social History  . Marital Status: Single    Spouse Name: N/A    Number of Children: N/A  . Years of Education: N/A   Occupational History  . salesman    Social History Main Topics  . Smoking status: Former Smoker -- 0.7 packs/day for 15 years    Quit date: 02/08/2006  . Smokeless tobacco: Not on file  . Alcohol Use: No  . Drug Use: No  . Sexually Active: Not on file   Other Topics Concern  . Not on file   Social History Narrative  . 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 Diabetes Father   . Heart failure Father   . Cancer Mother     lymphoma  . Diabetes Mother     PHYSICAL EXAM: Filed Vitals:   08/24/11 1056  BP: 108/70  Pulse: 91  Weight: 185 lb 12 oz (84.256 kg)  SpO2: 99%    General:  Well appearing. No respiratory difficulty HEENT: normal Neck: supple. no JVD. Carotids 2+ bilat; no bruits. No lymphadenopathy or thryomegaly appreciated. Cor: PMI nondisplaced. Regular rate & rhythm. No rubs, gallops or murmurs. Lungs: clear Abdomen: soft, nontender, nondistended. No hepatosplenomegaly. No bruits or masses. Good bowel sounds. Extremities: no cyanosis, clubbing, rash, edema Neuro: alert & oriented x 3, cranial nerves grossly intact. moves all 4 extremities w/o difficulty. Affect pleasant.     ASSESSMENT & PLAN:

## 2011-08-24 NOTE — Assessment & Plan Note (Addendum)
Have discussed the role of the HF clinic in full.  Have re-educated on low sodium diet, daily weights and sliding scale lasix.  NYHA II.  His volume status appears euvolemic, may even be a bit dry.  Will cut back lasix to 20 mg every other day.  With dry hacky cough will also change lisinopril to losartan.  Follow up 2 weeks for further med titration, should be able to titrate carvedilol.  Check labs today with spiro.  Have discussed we will titrate medications until tolerated dose and recheck echo after 3 months.    Greater than 25 minutes used to discuss education material and side effects of medications.

## 2011-08-24 NOTE — Assessment & Plan Note (Signed)
Controlled.  As above will change lisinopril to losartan.

## 2011-08-24 NOTE — Patient Instructions (Addendum)
Stop lisinopril.    Start losartan daily.  Decrease lasix 20 mg every other day.  Labs today.  Follow up 2 weeks.

## 2011-08-25 ENCOUNTER — Telehealth (HOSPITAL_COMMUNITY): Payer: Self-pay | Admitting: Internal Medicine

## 2011-08-25 MED ORDER — AZITHROMYCIN 250 MG PO TABS: ORAL_TABLET | ORAL | Status: AC

## 2011-08-25 NOTE — Telephone Encounter (Signed)
Dustin Lozano called and stated that a script was going to be called in for him for a Zpack, He was calling to see when it will be called in. I advised him I will send a message to the nurse and let her know. Thanks.

## 2011-08-25 NOTE — Telephone Encounter (Signed)
Per Ulyess Blossom, PA pt does need z-pak, pt is aware this was sent in

## 2011-09-13 ENCOUNTER — Telehealth (HOSPITAL_COMMUNITY): Payer: Self-pay | Admitting: Internal Medicine

## 2011-09-13 NOTE — Telephone Encounter (Signed)
Left message to call back  

## 2011-09-13 NOTE — Telephone Encounter (Signed)
Please call pt concerning medications. Pt not sure which he will need to be refilled//ars

## 2011-09-14 MED ORDER — CARVEDILOL 3.125 MG PO TABS: 3.1250 mg | ORAL_TABLET | Freq: Two times a day (BID) | ORAL | Status: DC

## 2011-09-14 MED ORDER — SPIRONOLACTONE 25 MG PO TABS: 12.5000 mg | ORAL_TABLET | Freq: Once | ORAL | Status: DC

## 2011-09-14 NOTE — Telephone Encounter (Signed)
Spoke w/pt refills sent in

## 2011-09-15 ENCOUNTER — Encounter (HOSPITAL_COMMUNITY): Payer: Self-pay

## 2011-09-20 ENCOUNTER — Ambulatory Visit (INDEPENDENT_AMBULATORY_CARE_PROVIDER_SITE_OTHER): Payer: Self-pay | Admitting: Family

## 2011-09-20 ENCOUNTER — Encounter: Payer: Self-pay | Admitting: Family

## 2011-09-20 VITALS — BP 146/94 | HR 110 | Temp 98.5°F | Wt 197.0 lb

## 2011-09-20 DIAGNOSIS — E7800 Pure hypercholesterolemia, unspecified: Secondary | ICD-10-CM

## 2011-09-20 DIAGNOSIS — I10 Essential (primary) hypertension: Secondary | ICD-10-CM

## 2011-09-20 DIAGNOSIS — E119 Type 2 diabetes mellitus without complications: Secondary | ICD-10-CM

## 2011-09-20 LAB — BASIC METABOLIC PANEL
CO2: 27 mEq/L (ref 19–32)
Glucose, Bld: 118 mg/dL — ABNORMAL HIGH (ref 70–99)
Potassium: 4 mEq/L (ref 3.5–5.1)
Sodium: 140 mEq/L (ref 135–145)

## 2011-09-20 NOTE — Progress Notes (Signed)
Subjective:    Patient ID: Dustin Lozano, male    DOB: 1961/01/29, 51 y.o.   MRN: [id number]  HPI 51 year old WM, nonsmoker, is in today for a recheck of HTN, DM, and Insomnia. He is doing well and tolerating meds well. Blood sugars between 140-230. He is not exercising on a regular basis. Denies any concerns today. He is fasting.   Review of Systems  Constitutional: Negative.   HENT: Negative.   Eyes: Negative.   Respiratory: Negative.   Cardiovascular: Negative.   Gastrointestinal: Negative.   Genitourinary: Negative.   Musculoskeletal: Negative.   Skin: Negative.   Neurological: Negative.   Hematological: Negative.   Psychiatric/Behavioral: Negative.    Past Medical History  Diagnosis Date  . HTN (hypertension)   . Diabetes mellitus   . CHF (congestive heart failure)     Systolic - LVEF 25-30%  (08/2011)  . Coronary artery disease     Nonobstructive disease per cath 08/2011    History   Social History  . Marital Status: Single    Spouse Name: N/A    Number of Children: N/A  . Years of Education: N/A   Occupational History  . salesman    Social History Main Topics  . Smoking status: Former Smoker -- 0.7 packs/day for 15 years    Quit date: 02/08/2006  . Smokeless tobacco: Not on file  . Alcohol Use: No  . Drug Use: No  . Sexually Active: Not on file   Other Topics Concern  . Not on file   Social History Narrative  . No narrative on file    Past Surgical History  Procedure Date  . Appendectomy     Family History  Problem Relation Age of Onset  . Diabetes Father   . Heart failure Father   . Cancer Mother     lymphoma  . Diabetes Mother     No Known Allergies  Current Outpatient Prescriptions on File Prior to Visit  Medication Sig Dispense Refill  . aspirin EC 81 MG EC tablet Take 1 tablet (81 mg total) by mouth daily.  30 tablet  0  . carvedilol (COREG) 3.125 MG tablet Take 1 tablet (3.125 mg total) by mouth 2 (two) times daily with a meal.   60 tablet  6  . furosemide (LASIX) 20 MG tablet 1 tab every other day.  May take extra tab if weight increases.  30 tablet  3  . glipiZIDE (GLUCOTROL) 5 MG tablet Take 1 tablet (5 mg total) by mouth 2 (two) times daily before a meal.  60 tablet  3  . losartan (COZAAR) 50 MG tablet Take 1 tablet (50 mg total) by mouth daily.  30 tablet  3  . Multiple Vitamin (MULTIVITAMIN WITH MINERALS) TABS Take 1 tablet by mouth daily.      . nitroGLYCERIN (NITROSTAT) 0.4 MG SL tablet Place 1 tablet (0.4 mg total) under the tongue every 5 (five) minutes as needed for chest pain (CP or SOB).  20 tablet  0  . spironolactone (ALDACTONE) 25 MG tablet Take 0.5 tablets (12.5 mg total) by mouth once.  30 tablet  6  . eszopiclone (LUNESTA) 2 MG TABS Take 1 tablet (2 mg total) by mouth at bedtime. Take immediately before bedtime  30 tablet  2    BP 146/94  Pulse 110  Temp 98.5 F (36.9 C) (Oral)  Wt 197 lb (89.359 kg)  SpO2 98%chart    Objective:   Physical Exam  Constitutional: He is oriented to person, place, and time. He appears well-developed and well-nourished.  HENT:  Right Ear: External ear normal.  Left Ear: External ear normal.  Nose: Nose normal.  Mouth/Throat: Oropharynx is clear and moist.  Eyes: Conjunctivae are normal. Pupils are equal, round, and reactive to light.  Neck: Normal range of motion. Neck supple.  Cardiovascular: Normal rate, regular rhythm and normal heart sounds.   Pulmonary/Chest: Effort normal and breath sounds normal.  Abdominal: Soft.  Musculoskeletal: Normal range of motion.  Neurological: He is alert and oriented to person, place, and time.  Skin: Skin is warm and dry.  Psychiatric: He has a normal mood and affect.          Assessment & Plan:  Assessment: Type 2 DM, Hypertension, Insomnia  Plan: Labs sent A1C, lipids, BMP. Will notify patient pending results. Continue current meds.

## 2011-10-01 ENCOUNTER — Encounter (HOSPITAL_COMMUNITY): Payer: Self-pay

## 2011-10-08 ENCOUNTER — Ambulatory Visit (HOSPITAL_COMMUNITY)
Admission: RE | Admit: 2011-10-08 | Discharge: 2011-10-08 | Disposition: A | Discharge status: Home / Self Care | Payer: Self-pay | Source: Ambulatory Visit | Attending: Internal Medicine | Admitting: Internal Medicine

## 2011-10-08 ENCOUNTER — Encounter (HOSPITAL_COMMUNITY): Payer: Self-pay

## 2011-10-08 VITALS — BP 142/92 | HR 95 | Ht 72.0 in | Wt 200.4 lb

## 2011-10-08 DIAGNOSIS — I5022 Chronic systolic (congestive) heart failure: Secondary | ICD-10-CM

## 2011-10-08 MED ORDER — FUROSEMIDE 20 MG PO TABS: 20.0000 mg | ORAL_TABLET | ORAL | Status: DC | PRN

## 2011-10-08 MED ORDER — SPIRONOLACTONE 25 MG PO TABS: 25.0000 mg | ORAL_TABLET | Freq: Once | ORAL | Status: DC

## 2011-10-08 MED ORDER — CARVEDILOL 3.125 MG PO TABS: 6.2500 mg | ORAL_TABLET | Freq: Two times a day (BID) | ORAL | Status: DC

## 2011-10-08 NOTE — Progress Notes (Signed)
Weight Range   Baseline      HPI: Dustin Lozano is a 51 y.o. gentlemen with recent diagnosis of systolic heart failure due to nonischemic cardiomyopathy with an EF 25-30%, DM2 and HTN. He underwent cardiac catheterization on 08/17/11 showing mild nonobstructive coronary artery disease with a 40-50% stenosis in the mid LAD, minimal stenosis in the left circumflex, and nonobstructive disease in the RCA. Of note, over the last 2 years he has lost 75 pounds.   Admitted to [facility] 7/6-7/10 due to progressive dyspnea and found to have newly diagnosed cardiomyopathy. EF 25-30% by echo with diffuse hypokinesis. He was diuresed and discharged on carvedilol 3.125 mg, lasix 20 mg, lisinopril 40 mg, aldactone 12.5 mg.  RHC 08/17/11  RA 7  RV 36/5  PA 40/17 mean 28  PCWP 22  LV 101/19  AO 102/73  Oxygen saturations:  PA 70  SVC 66  AO 94  Cardiac Output (Fick) 4.4 L/m  Cardiac Index (Fick) 2.1 L/m/m2  Follow up: Just returned from the beach and feels great and walking 2 miles a day. Weighing daily and stable, not happy because gaining a little weight and is trying to lose about 20 lbs. Taking medications as prescribed. Denies SOB/orthopnea/CP or edema. + neuropathy in feet and will follow up with PCP, blood sugars 110-120's.  ROS: All systems negative except as listed in HPI, PMH and Problem List.  Past Medical History  Diagnosis Date  . HTN (hypertension)   . Diabetes mellitus   . CHF (congestive heart failure)     Systolic - LVEF 25-30%  (08/2011)  . Coronary artery disease     Nonobstructive disease per cath 08/2011    Current Outpatient Prescriptions  Medication Sig Dispense Refill  . aspirin EC 81 MG EC tablet Take 1 tablet (81 mg total) by mouth daily.  30 tablet  0  . carvedilol (COREG) 3.125 MG tablet Take 2 tablets (6.25 mg total) by mouth 2 (two) times daily with a meal.  60 tablet  6  . eszopiclone (LUNESTA) 2 MG TABS Take 2 mg by mouth as needed. Take immediately before bedtime      .  furosemide (LASIX) 20 MG tablet Take 1 tablet (20 mg total) by mouth as needed.  30 tablet  3  . glipiZIDE (GLUCOTROL) 5 MG tablet Take 1 tablet (5 mg total) by mouth 2 (two) times daily before a meal.  60 tablet  3  . losartan (COZAAR) 50 MG tablet Take 1 tablet (50 mg total) by mouth daily.  30 tablet  3  . Multiple Vitamin (MULTIVITAMIN WITH MINERALS) TABS Take 1 tablet by mouth daily.      . nitroGLYCERIN (NITROSTAT) 0.4 MG SL tablet Place 1 tablet (0.4 mg total) under the tongue every 5 (five) minutes as needed for chest pain (CP or SOB).  20 tablet  0  . spironolactone (ALDACTONE) 25 MG tablet Take 1 tablet (25 mg total) by mouth once.  30 tablet  6  . DISCONTD: carvedilol (COREG) 3.125 MG tablet Take 1 tablet (3.125 mg total) by mouth 2 (two) times daily with a meal.  60 tablet  6  . DISCONTD: eszopiclone (LUNESTA) 2 MG TABS Take 1 tablet (2 mg total) by mouth at bedtime. Take immediately before bedtime  30 tablet  2  . DISCONTD: furosemide (LASIX) 20 MG tablet 1 tab every other day.  May take extra tab if weight increases.  30 tablet  3  . DISCONTD: spironolactone (ALDACTONE) 25  MG tablet Take 0.5 tablets (12.5 mg total) by mouth once.  30 tablet  6     PHYSICAL EXAM: Filed Vitals:   10/08/11 1116  BP: 142/92  Pulse: 95  Height: 6' (1.829 m)  Weight: 200 lb 6.4 oz (90.901 kg)  SpO2: 99%    General:  Well appearing. No resp difficulty HEENT: normal Neck: supple. JVP flat. Carotids 2+ bilaterally; no bruits. No lymphadenopathy or thryomegaly appreciated. Cor: PMI normal. Regular rate & rhythm. No rubs, gallops or murmurs. Lungs: clear Abdomen: soft, nontender, nondistended. No hepatosplenomegaly. No bruits or masses. Good bowel sounds. Extremities: no cyanosis, clubbing, rash, edema Neuro: alert & orientedx3, cranial nerves grossly intact. Moves all 4 extremities w/o difficulty. Affect pleasant.     ASSESSMENT & PLAN:

## 2011-10-08 NOTE — Assessment & Plan Note (Addendum)
NYHA II, volume status good. Patient doing well. BP 142/92, will titrate coreg to 6.25 mg BID and will increase Spironolactone to 25 mg daily. Will wait a week to cut back lasix to PRN because of increase of BB. Continue to weigh daily and educated patient on sliding scale diuretics. Will have BMET next week. Follow up 3 weeks and will continue to try to titrate coreg.   Patient seen and examined with Ulla Potash, NP. We discussed all aspects of the encounter. I agree with the assessment and plan as stated above. He is doing great. NYHA I-II. Volume status looks good. Will make changes as above. Reviewed need for daily weights and reviewed use of sliding scale diuretics at length however this is complicated some by the fact that he appears to be gaining a little bit of weight which is not fluid. Knows to call us if he has any questions or symptoms in the interim.

## 2011-10-08 NOTE — Patient Instructions (Addendum)
Increase Spironolactone to 25 mg daily.  Increase coreg to 6.25 mg twice a day.  Stop taking lasix next week and then just take as needed if gaining weight.  Labs next week.  Follow up 3 weeks

## 2011-10-14 ENCOUNTER — Other Ambulatory Visit: Payer: Self-pay | Admitting: *Deleted

## 2011-10-14 DIAGNOSIS — I10 Essential (primary) hypertension: Secondary | ICD-10-CM

## 2011-10-14 DIAGNOSIS — I5022 Chronic systolic (congestive) heart failure: Secondary | ICD-10-CM

## 2011-10-15 ENCOUNTER — Other Ambulatory Visit (INDEPENDENT_AMBULATORY_CARE_PROVIDER_SITE_OTHER): Payer: Self-pay

## 2011-10-15 DIAGNOSIS — I5022 Chronic systolic (congestive) heart failure: Secondary | ICD-10-CM

## 2011-10-15 DIAGNOSIS — I10 Essential (primary) hypertension: Secondary | ICD-10-CM

## 2011-10-15 LAB — BASIC METABOLIC PANEL
CO2: 24 mEq/L (ref 19–32)
Chloride: 107 mEq/L (ref 96–112)
GFR: 119.47 mL/min (ref >60.00)
Glucose, Bld: 126 mg/dL — ABNORMAL HIGH (ref 70–99)
Potassium: 4.2 mEq/L (ref 3.5–5.1)
Sodium: 140 mEq/L (ref 135–145)

## 2011-10-26 ENCOUNTER — Ambulatory Visit (HOSPITAL_COMMUNITY)
Admission: RE | Admit: 2011-10-26 | Discharge: 2011-10-26 | Disposition: A | Discharge status: Home / Self Care | Payer: Self-pay | Source: Ambulatory Visit | Attending: Internal Medicine | Admitting: Internal Medicine

## 2011-10-26 ENCOUNTER — Encounter (HOSPITAL_COMMUNITY): Payer: Self-pay

## 2011-10-26 VITALS — BP 144/92 | HR 94 | Ht 72.0 in | Wt 202.8 lb

## 2011-10-26 DIAGNOSIS — I5022 Chronic systolic (congestive) heart failure: Secondary | ICD-10-CM | POA: Insufficient documentation

## 2011-10-26 DIAGNOSIS — I10 Essential (primary) hypertension: Secondary | ICD-10-CM | POA: Insufficient documentation

## 2011-10-26 DIAGNOSIS — I509 Heart failure, unspecified: Secondary | ICD-10-CM | POA: Insufficient documentation

## 2011-10-26 MED ORDER — LOSARTAN POTASSIUM 50 MG PO TABS: 100.0000 mg | ORAL_TABLET | Freq: Every day | ORAL | Status: DC

## 2011-10-26 NOTE — Assessment & Plan Note (Signed)
SBP> 140. As noted below losartan increased to 100 mg daily. Goal for SBP <130.

## 2011-10-26 NOTE — Patient Instructions (Addendum)
Take Losartan 100 mg daily  Do the following things EVERYDAY: 1) Weigh yourself in the morning before breakfast. Write it down and keep it in a log. 2) Take your medicines as prescribed 3) Eat low salt foods-Limit salt (sodium) to 2000 mg per day.  4) Stay as active as you can everyday 5) Limit all fluids for the day to less than 2 liters    Please schedule BMET in 2 weeks  Follow up in 3 weeks.

## 2011-10-26 NOTE — Progress Notes (Signed)
Patient ID: Dustin Lozano, male   DOB: April 15, 1960, 51 y.o.   MRN: [id number]  Weight Range   Baseline    PCP: Dr Orvan Falconer  HPI: Dustin Lozano is a 51 y.o. gentlemen with recent diagnosis of systolic heart failure due to nonischemic cardiomyopathy with an EF 25-30%, DM2 and HTN. He underwent cardiac catheterization on 08/17/11 showing mild nonobstructive coronary artery disease with a 40-50% stenosis in the mid LAD, minimal stenosis in the left circumflex, and nonobstructive disease in the RCA. Of note, over the last 2 years he has lost 75 pounds.   Admitted to [facility] 7/6-7/10 due to progressive dyspnea and found to have newly diagnosed cardiomyopathy. EF 25-30% by echo with diffuse hypokinesis. He was diuresed and discharged on carvedilol 3.125 mg, lasix 20 mg, lisinopril 40 mg, aldactone 12.5 mg.  RHC 08/17/11  RA 7  RV 36/5  PA 40/17 mean 28  PCWP 22  LV 101/19  AO 102/73  Oxygen saturations:  PA 70  SVC 66  AO 94  Cardiac Output (Fick) 4.4 L/m  Cardiac Index (Fick) 2.1 L/m/m2  He returns for follow up. Last visit lasix switched to prn, carvedilol titrated up to 6.25 mg twice daily and spironolactone increased to 25 daily. Denies SOB/PND/Orthopnea/dizziness. Weight at home 200 pounds. Complains of foot pain. Walks 2 miles per day at least 6 days per week. Works a traveling Medical illustrator. Follows low salt diet. Tries to limit fluid intake.    ROS: All systems negative except as listed in HPI, PMH and Problem List.  Past Medical History  Diagnosis Date  . HTN (hypertension)   . Diabetes mellitus   . CHF (congestive heart failure)     Systolic - LVEF 25-30%  (08/2011)  . Coronary artery disease     Nonobstructive disease per cath 08/2011    Current Outpatient Prescriptions  Medication Sig Dispense Refill  . aspirin EC 81 MG EC tablet Take 1 tablet (81 mg total) by mouth daily.  30 tablet  0  . carvedilol (COREG) 3.125 MG tablet Take 2 tablets (6.25 mg total) by mouth 2 (two) times daily with  a meal.  60 tablet  6  . eszopiclone (LUNESTA) 2 MG TABS Take 2 mg by mouth as needed. Take immediately before bedtime      . furosemide (LASIX) 20 MG tablet Take 1 tablet (20 mg total) by mouth as needed.  30 tablet  3  . glipiZIDE (GLUCOTROL) 5 MG tablet Take 1 tablet (5 mg total) by mouth 2 (two) times daily before a meal.  60 tablet  3  . losartan (COZAAR) 50 MG tablet Take 1 tablet (50 mg total) by mouth daily.  30 tablet  3  . Multiple Vitamin (MULTIVITAMIN WITH MINERALS) TABS Take 1 tablet by mouth daily.      . nitroGLYCERIN (NITROSTAT) 0.4 MG SL tablet Place 1 tablet (0.4 mg total) under the tongue every 5 (five) minutes as needed for chest pain (CP or SOB).  20 tablet  0  . spironolactone (ALDACTONE) 25 MG tablet Take 1 tablet (25 mg total) by mouth once.  30 tablet  6     PHYSICAL EXAM: Filed Vitals:   10/26/11 1126  BP: 144/92  Pulse: 94  Height: 6' (1.829 m)  Weight: 202 lb 12.8 oz (91.989 kg)  SpO2: 99%    General:  Well appearing. No resp difficulty HEENT: normal Neck: supple. JVP flat. Carotids 2+ bilaterally; no bruits. No lymphadenopathy or thryomegaly appreciated. Cor: PMI  normal. Regular rate & rhythm. No rubs, gallops or murmurs. Lungs: clear Abdomen: soft, nontender, nondistended. No hepatosplenomegaly. No bruits or masses. Good bowel sounds. Extremities: no cyanosis, clubbing, rash, edema Neuro: alert & orientedx3, cranial nerves grossly intact. Moves all 4 extremities w/o difficulty. Affect pleasant.     ASSESSMENT & PLAN:

## 2011-10-26 NOTE — Assessment & Plan Note (Signed)
NYHA I. Volume status stable. SBP >140. Increase losartan 100 mg daily. Repeat BMET in 2 weeks. Reinforced daily weights, limiting fluid intake to less than 2 liters per day. Will need repeat ECHO in late October after 3 months of optimal medication management. Follow up in 3 weeks.

## 2011-11-09 ENCOUNTER — Other Ambulatory Visit (INDEPENDENT_AMBULATORY_CARE_PROVIDER_SITE_OTHER): Payer: Self-pay

## 2011-11-09 DIAGNOSIS — I5022 Chronic systolic (congestive) heart failure: Secondary | ICD-10-CM

## 2011-11-09 LAB — BASIC METABOLIC PANEL
BUN: 17 mg/dL (ref 6–23)
CO2: 28 mEq/L (ref 19–32)
Chloride: 99 mEq/L (ref 96–112)
Creatinine, Ser: 0.8 mg/dL (ref 0.4–1.5)
Glucose, Bld: 136 mg/dL — ABNORMAL HIGH (ref 70–99)

## 2011-11-16 ENCOUNTER — Ambulatory Visit (HOSPITAL_COMMUNITY)
Admission: RE | Admit: 2011-11-16 | Discharge: 2011-11-16 | Disposition: A | Discharge status: Home / Self Care | Payer: Self-pay | Source: Ambulatory Visit | Attending: Internal Medicine | Admitting: Internal Medicine

## 2011-11-16 ENCOUNTER — Encounter (HOSPITAL_COMMUNITY): Payer: Self-pay

## 2011-11-16 VITALS — BP 134/92 | HR 106 | Ht 72.0 in | Wt 201.1 lb

## 2011-11-16 DIAGNOSIS — I10 Essential (primary) hypertension: Secondary | ICD-10-CM

## 2011-11-16 DIAGNOSIS — I5022 Chronic systolic (congestive) heart failure: Secondary | ICD-10-CM | POA: Insufficient documentation

## 2011-11-16 MED ORDER — LOSARTAN POTASSIUM 100 MG PO TABS: 100.0000 mg | ORAL_TABLET | Freq: Every day | ORAL | Status: DC

## 2011-11-16 MED ORDER — SPIRONOLACTONE 25 MG PO TABS: 25.0000 mg | ORAL_TABLET | Freq: Once | ORAL | Status: DC

## 2011-11-16 MED ORDER — CARVEDILOL 12.5 MG PO TABS: 12.5000 mg | ORAL_TABLET | Freq: Two times a day (BID) | ORAL | Status: DC

## 2011-11-16 NOTE — Progress Notes (Signed)
Weight Range    197-200  Baseline    1396 on 08/14/11  PCP: Dr Orvan Falconer  HPI: Mr. Kinnett is a 51 y.o. gentlemen with diagnosis of systolic heart failure due to nonischemic cardiomyopathy with an EF 25-30%, DM2 and HTN. He underwent cardiac catheterization on 08/17/11 showing mild nonobstructive coronary artery disease with a 40-50% stenosis in the mid LAD, minimal stenosis in the left circumflex, and nonobstructive disease in the RCA. Of note, over the last 2 years he has lost 75 pounds.   Admitted to [facility] 7/6-7/10 due to progressive dyspnea and found to have newly diagnosed cardiomyopathy. EF 25-30% by echo with diffuse hypokinesis. He was diuresed and discharged. RHC 08/17/11  RA 7  RV 36/5  PA 40/17 mean 28  PCWP 22  LV 101/19  AO 102/73  Oxygen saturations:  PA 70  SVC 66  AO 94  Cardiac Output (Fick) 4.4 L/m  Cardiac Index (Fick) 2.1 L/m/m2  He returns for follow up. Increased losartan 100 mg daily last visit.  He tolerated this well.  He continues to walk 2 miles a day most days during the week.  He denies SOB/PND or orthopnea.  No edema.  He has not taken extra fluid pills.  He is complaint with medications and his diet.     ROS: All systems negative except as listed in HPI, PMH and Problem List.  Past Medical History  Diagnosis Date  . HTN (hypertension)   . Diabetes mellitus   . CHF (congestive heart failure)     Systolic - LVEF 25-30%  (08/2011)  . Coronary artery disease     Nonobstructive disease per cath 08/2011    Current Outpatient Prescriptions  Medication Sig Dispense Refill  . aspirin EC 81 MG EC tablet Take 1 tablet (81 mg total) by mouth daily.  30 tablet  0  . carvedilol (COREG) 3.125 MG tablet Take 2 tablets (6.25 mg total) by mouth 2 (two) times daily with a meal.  60 tablet  6  . eszopiclone (LUNESTA) 2 MG TABS Take 2 mg by mouth as needed. Take immediately before bedtime      . furosemide (LASIX) 20 MG tablet Take 1 tablet (20 mg total) by mouth as needed.   30 tablet  3  . glipiZIDE (GLUCOTROL) 5 MG tablet Take 1 tablet (5 mg total) by mouth 2 (two) times daily before a meal.  60 tablet  3  . losartan (COZAAR) 50 MG tablet Take 2 tablets (100 mg total) by mouth daily.  60 tablet  3  . Multiple Vitamin (MULTIVITAMIN WITH MINERALS) TABS Take 1 tablet by mouth daily.      . nitroGLYCERIN (NITROSTAT) 0.4 MG SL tablet Place 1 tablet (0.4 mg total) under the tongue every 5 (five) minutes as needed for chest pain (CP or SOB).  20 tablet  0  . spironolactone (ALDACTONE) 25 MG tablet Take 1 tablet (25 mg total) by mouth once.  30 tablet  6     PHYSICAL EXAM: Filed Vitals:   11/16/11 0853  BP: 134/92  Pulse: 106  Height: 6' (1.829 m)  Weight: 201 lb 1.9 oz (91.227 kg)  SpO2: 98%    General:  Well appearing. No resp difficulty HEENT: normal Neck: supple. JVP flat. Carotids 2+ bilaterally; no bruits. No lymphadenopathy or thryomegaly appreciated. Cor: PMI normal. Regular rate & rhythm. No rubs, gallops or murmurs. Lungs: clear Abdomen: soft, nontender, nondistended. No hepatosplenomegaly. No bruits or masses. Good bowel sounds. Extremities: no  cyanosis, clubbing, rash, edema Neuro: alert & orientedx3, cranial nerves grossly intact. Moves all 4 extremities w/o difficulty. Affect pleasant.     ASSESSMENT & PLAN:

## 2011-11-16 NOTE — Patient Instructions (Addendum)
Increase carvedilol 3 tabs (9.375 mg) at night for 3 nights then increase morning dose 3 tabs (9.375 mg)  After one week increase to 4 tabs (12.5 mg) at night then 3 days later increase morning dose to 4 tabs (12.5 mg).  When you pick up new prescriptions only take 1 tab of each.  Follow up 3 weeks with echo.  Your physician has requested that you have an echocardiogram. Echocardiography is a painless test that uses sound waves to create images of your heart. It provides your doctor with information about the size and shape of your heart and how well your heart's chambers and valves are working. This procedure takes approximately one hour. There are no restrictions for this procedure.

## 2011-11-17 NOTE — Assessment & Plan Note (Signed)
Volume status looks good.  NYHA I-II.  Will titrate carvedilol to 12.5 mg BID over the next 2 weeks.  The patient will then follow up for repeat echo in 1 month.  If EF remains <35% will reassess functional class at that time for possible ICD implantation.

## 2011-11-17 NOTE — Assessment & Plan Note (Signed)
Would like SBP <130.  Will titrate carvedilol as above.

## 2011-12-03 ENCOUNTER — Ambulatory Visit (HOSPITAL_COMMUNITY)
Admission: RE | Admit: 2011-12-03 | Discharge: 2011-12-03 | Disposition: A | Discharge status: Home / Self Care | Payer: Self-pay | Source: Ambulatory Visit | Attending: Internal Medicine | Admitting: Internal Medicine

## 2011-12-03 ENCOUNTER — Ambulatory Visit (HOSPITAL_BASED_OUTPATIENT_CLINIC_OR_DEPARTMENT_OTHER)
Admission: RE | Admit: 2011-12-03 | Discharge: 2011-12-03 | Disposition: A | Discharge status: Home / Self Care | Payer: Self-pay | Source: Ambulatory Visit | Attending: Internal Medicine | Admitting: Internal Medicine

## 2011-12-03 VITALS — BP 126/91 | HR 75 | Wt 201.2 lb

## 2011-12-03 DIAGNOSIS — I5022 Chronic systolic (congestive) heart failure: Secondary | ICD-10-CM

## 2011-12-03 DIAGNOSIS — I509 Heart failure, unspecified: Secondary | ICD-10-CM

## 2011-12-03 DIAGNOSIS — I10 Essential (primary) hypertension: Secondary | ICD-10-CM

## 2011-12-03 NOTE — Progress Notes (Signed)
  Echocardiogram 2D Echocardiogram has been performed.  Janis Sol 12/03/2011, 9:20 AM

## 2011-12-03 NOTE — Progress Notes (Signed)
Weight Range    197-200  Baseline    1396 on 08/14/11  PCP: Dr Orvan Falconer  HPI: [name] is a 51 y.o. gentlemen with diagnosis of systolic heart failure due to nonischemic cardiomyopathy with an EF 25-30%, DM2 and HTN. He underwent cardiac catheterization on 08/17/11 showing mild nonobstructive coronary artery disease with a 40-50% stenosis in the mid LAD, minimal stenosis in the left circumflex, and nonobstructive disease in the RCA. Of note, over the last 2 years he has lost 75 pounds.   Admitted to [facility] 7/6-7/10 due to progressive dyspnea and found to have newly diagnosed cardiomyopathy. EF 25-30% by echo with diffuse hypokinesis. He was diuresed and discharged. RHC 08/17/11  RA 7  RV 36/5  PA 40/17 mean 28  PCWP 22  LV 101/19  AO 102/73  Oxygen saturations:  PA 70  SVC 66  AO 94  Cardiac Output (Fick) 4.4 L/m  Cardiac Index (Fick) 2.1 L/m/m2  He returns for follow up.  He feels well today.  Over the weekend he moved and had several days of lightheadedness.  This has improved.  He has not taken any lasix.   His weight is unchanged.  He continues to walk most days.  Denies orthopnea/PND.  No edema.  No chest pain.  Compliant with meds.    ROS: All systems negative except as listed in HPI, PMH and Problem List.  Past Medical History  Diagnosis Date  . HTN (hypertension)   . Diabetes mellitus   . CHF (congestive heart failure)     Systolic - LVEF 25-30%  (08/2011)  . Coronary artery disease     Nonobstructive disease per cath 08/2011    Current Outpatient Prescriptions  Medication Sig Dispense Refill  . aspirin EC 81 MG EC tablet Take 1 tablet (81 mg total) by mouth daily.  30 tablet  0  . carvedilol (COREG) 12.5 MG tablet Take 1 tablet (12.5 mg total) by mouth 2 (two) times daily with a meal.  60 tablet  6  . eszopiclone (LUNESTA) 2 MG TABS Take 2 mg by mouth as needed. Take immediately before bedtime      . furosemide (LASIX) 20 MG tablet Take 1 tablet (20 mg total) by mouth as  needed.  30 tablet  3  . glipiZIDE (GLUCOTROL) 5 MG tablet Take 1 tablet (5 mg total) by mouth 2 (two) times daily before a meal.  60 tablet  3  . losartan (COZAAR) 100 MG tablet Take 1 tablet (100 mg total) by mouth daily.  30 tablet  6  . Multiple Vitamin (MULTIVITAMIN WITH MINERALS) TABS Take 1 tablet by mouth daily.      . nitroGLYCERIN (NITROSTAT) 0.4 MG SL tablet Place 1 tablet (0.4 mg total) under the tongue every 5 (five) minutes as needed for chest pain (CP or SOB).  20 tablet  0  . spironolactone (ALDACTONE) 25 MG tablet Take 1 tablet (25 mg total) by mouth once.  30 tablet  6     PHYSICAL EXAM: Filed Vitals:   12/03/11 0857  BP: 128/78  Pulse: 70  Weight: 201 lb 4 oz (91.286 kg)  SpO2: 99%    General:  Well appearing. No resp difficulty HEENT: normal Neck: supple. JVP flat. Carotids 2+ bilaterally; no bruits. No lymphadenopathy or thryomegaly appreciated. Cor: PMI normal. Regular rate & rhythm. No rubs, gallops or murmurs. Lungs: clear Abdomen: soft, nontender, nondistended. No hepatosplenomegaly. No bruits or masses. Good bowel sounds. Extremities: no cyanosis, clubbing, rash,  edema Neuro: alert & orientedx3, cranial nerves grossly intact. Moves all 4 extremities w/o difficulty. Affect pleasant.     ASSESSMENT & PLAN:

## 2011-12-06 NOTE — Assessment & Plan Note (Signed)
Controlled, continue current meds.   

## 2011-12-06 NOTE — Assessment & Plan Note (Signed)
Volume status stable.  NYHA I.  I feel that this weekend he exerted himself and may have been a little dehydrated.  His orthostatics are negative today but says he does not feel dizzy today.  Have asked him on days like that to drink a little more fluid.  He voices understanding.  Will not titrate meds at this time with recent dizziness.  Functional status remains NYHA I therefore he does not qualify for ICD implantation for primary prevention of SCD.  Will continue to monitor at this time and titrate meds as tolerated.

## 2011-12-19 ENCOUNTER — Other Ambulatory Visit: Payer: Self-pay | Admitting: Family

## 2011-12-20 ENCOUNTER — Encounter: Payer: Self-pay | Admitting: Family

## 2011-12-20 ENCOUNTER — Ambulatory Visit (INDEPENDENT_AMBULATORY_CARE_PROVIDER_SITE_OTHER): Payer: Self-pay | Admitting: Family

## 2011-12-20 VITALS — BP 144/80 | HR 80 | Temp 97.8°F | Wt 204.0 lb

## 2011-12-20 DIAGNOSIS — M79609 Pain in unspecified limb: Secondary | ICD-10-CM

## 2011-12-20 DIAGNOSIS — M79671 Pain in right foot: Secondary | ICD-10-CM

## 2011-12-20 DIAGNOSIS — R5383 Other fatigue: Secondary | ICD-10-CM

## 2011-12-20 DIAGNOSIS — E1140 Type 2 diabetes mellitus with diabetic neuropathy, unspecified: Secondary | ICD-10-CM

## 2011-12-20 DIAGNOSIS — R5381 Other malaise: Secondary | ICD-10-CM

## 2011-12-20 DIAGNOSIS — E1149 Type 2 diabetes mellitus with other diabetic neurological complication: Secondary | ICD-10-CM

## 2011-12-20 DIAGNOSIS — E785 Hyperlipidemia, unspecified: Secondary | ICD-10-CM

## 2011-12-20 DIAGNOSIS — E1142 Type 2 diabetes mellitus with diabetic polyneuropathy: Secondary | ICD-10-CM

## 2011-12-20 DIAGNOSIS — Z23 Encounter for immunization: Secondary | ICD-10-CM

## 2011-12-20 LAB — LIPID PANEL
Cholesterol: 144 mg/dL (ref 0–200)
LDL Cholesterol: 76 mg/dL (ref 0–99)
Triglycerides: 162 mg/dL — ABNORMAL HIGH (ref 0.0–149.0)

## 2011-12-20 LAB — COMPREHENSIVE METABOLIC PANEL
Albumin: 4 g/dL (ref 3.5–5.2)
Alkaline Phosphatase: 62 U/L (ref 39–117)
BUN: 16 mg/dL (ref 6–23)
Glucose, Bld: 115 mg/dL — ABNORMAL HIGH (ref 70–99)
Potassium: 4.4 mEq/L (ref 3.5–5.1)

## 2011-12-20 MED ORDER — GABAPENTIN 100 MG PO CAPS: 100.0000 mg | ORAL_CAPSULE | Freq: Three times a day (TID) | ORAL | Status: DC

## 2011-12-20 MED ORDER — GLIPIZIDE 5 MG PO TABS: 5.0000 mg | ORAL_TABLET | Freq: Two times a day (BID) | ORAL | Status: DC

## 2011-12-20 NOTE — Patient Instructions (Addendum)
Diabetic Neuropathy  Diabetic neuropathy is a common complication caused by diabetes. Neuropathy is a term that means nerve disease or damage. If your diabetes is uncontrolled and you have high blood glucose (sugar) levels, over time, this can lead to damage to nerves throughout your body. There are three types of diabetic neuropathy:   · Peripheral.  · Autonomic.  · Focal.  PERIPHERAL NEUROPATHY  Peripheral neuropathy is the most common form of diabetic neuropathy. It causes damage to the nerves of the feet and legs and eventually the hands and arms.   SYMPTOMS   Peripheral neuropathy occurs slowly over time. The peripheral nerves sense touch, hot and cold, and pain. When these nerves no longer work:   · Your feet become numb.  · You can no longer feel pressure or pain in your feet.  · You may have burning, stabbing or aching pain.  This can lead to:  · Thick calluses over pressure areas.  · Pressure sores.  · Ulcers. Ulcers can become infected with germs (bacteria) and can even lead to infection in the bones of the feet.  DIAGNOSIS   The diagnosis of diabetic neuropathy is difficult at best. Sensory function testing can be done with:  · Light touch using a monofilament.  · Vibration with tuning fork.  · Sharp sensation with pin prick  Other tests that can help diagnose neuropathy are:  · Nerve Conduction Velocities (NCV). This checks the transmission of electrical current through a nerve.  · Electromyography (EMG). This shows how muscles respond to electrical signals transmitted by nearby nerves.  · Quantitative sensory testing, which is used to assess how your nerves respond to vibration and changes in temperature.  AUTONOMIC NEUROPATHY  The autonomic nervous system controls functions that you do not think about. Examples would be:   · Heart beat.  · Regulation of body temperature.  · Blood pressure.  · Urination.  · Digestion.  · Sweating.  · Sexual function.  SYMPTOMS   The symptoms of autonomic neuropathy vary  depending on which nerves are affected.   · There can be problems with digestion such as:  · Feeling sick to your stomach (nausea).  · Vomiting.  · Bloating.  · Constipation.  · Diarrhea.  · Abdominal pain.  · Difficulty with urination may occur because of the inability to sense when your bladder is full. You may have urine leakage (incontinence) or inability to empty your bladder completely (retention).  · Palpitations or a feeling of an abnormal heart beat.  · Blood pressure drops on arising (orthostatic hypotension). This can happen when you first sit up or stand up. It causes you to feel:  · Dizzy.  · Weak.  · Faint.  · Sexual functioning:  · In men, inability to attain and maintain an erection.  · In women, vaginal dryness and problems with decreased sexual desire and arousal.  DIAGNOSIS   Diagnosis is often based on reported symptoms. Tell your medical caregiver if you experience:   · Dizziness.  · Constipation.  · Diarrhea.  · Inappropriate urination or inability to urinate.  · Inability to get or maintain an erection.  Tests that may be done include:  · An EKG or Holter Monitor. These are tests that can help show problems with the heart rate or heart rhythm.  · X-rays can be used to find if there are problems with your ability to properly empty food from your stomach into the small intestine after eating.  FOCAL   NEUROPATHY  Focal neuropathy affects just one nerve tract and occurs suddenly. However, it usually improves by itself over time. It does not cause long term damage, and treatments are usually needed only until the problem improves.  SYMPTOMS   Examples include:   · Abnormal eye movements or abnormal alignment of both eyes.  · Weakness in the wrist.  · Foot drop, which results in inability to lift the foot properly. This causes abnormal walking or foot movement.  DIAGNOSIS   Diagnosis is made based on your symptoms and what your caregiver finds on your exam. Other tests that may be done  include:  · Nerve Conduction Velocities (NCV). This checks the transmission of electrical current through a nerve.  · Electromyography (EMG). This shows how muscles respond to electrical signals transmitted by nearby nerves.  · Quantitative sensory testing, which is used to assess how your nerves respond to vibration and changes in temperature.  TREATMENT  Once nerve damage occurs it cannot be reversed. The goal of treatment is to keep the disease from getting worse. If it gets worse, it will affect more nerve fibers. Controlling your blood (sugar) is the key. You will need to keep your blood glucose and A1c at the target range prescribed by your caregiver. Things that will help control blood glucose levels include:  · Blood glucose monitoring.  · Meal planning.  · Physical activity.  · Diabetes medication.  Over time, maintaining lower blood glucose levels helps lessen symptoms.  Sometimes, prescription pain medicine is needed. Focal neuropathy can be painful and unpredictable and occurs most often in older adults with diabetes.   SEEK MEDICAL CARE IF:   · You develop peripheral nerve symptoms such as burning, numbness, or pain in your feet, legs or hands.  · You develop autonomic nerve symptoms such as:  · Dizziness.  · Abnormal urinary control.  · Inability to get an erection.  · You develop focal nerve symptoms such as sudden abnormal eye movements or sudden foot drop.  Document Released: 04/05/2001 Document Revised: 04/19/2011 Document Reviewed: 07/05/2008  [employer]® Patient Information ©2013 [employer].

## 2011-12-20 NOTE — Progress Notes (Signed)
Subjective:    Patient ID: Dustin Lozano, male    DOB: 05-29-1960, 51 y.o.   MRN: [id number]  HPI 51 year old white male, nonsmoker, is in for recheck of type 2 diabetes, insomnia, hypertension, hyperlipidemia. Has been doing well current medications. Has concerns of bilateral feet pain and numbness has been going on for one month. The pain max is 8/10 today, is a 5/10. Has not taken any medication for relief. Blood sugars have been between 99 and 160. Denies any polyuria polydipsia, blurred vision or double vision.   Review of Systems  Constitutional: Positive for fatigue. Negative for fever, appetite change and unexpected weight change.  HENT: Negative.   Eyes: Negative.   Respiratory: Negative.   Cardiovascular: Negative.   Gastrointestinal: Negative.   Genitourinary: Negative.  Negative for frequency, penile swelling, scrotal swelling and testicular pain.       Has erectile dysfunction  Musculoskeletal: Positive for myalgias.       Pain in both feet, described as numbness and burning.  Skin: Negative.   Neurological: Positive for numbness. Negative for dizziness, syncope, weakness and headaches.       In feet bilaterally  Hematological: Negative.   Psychiatric/Behavioral: Negative.    Past Medical History  Diagnosis Date  . HTN (hypertension)   . Diabetes mellitus   . CHF (congestive heart failure)     Systolic - LVEF 25-30%  (08/2011)  . Coronary artery disease     Nonobstructive disease per cath 08/2011    History   Social History  . Marital Status: Single    Spouse Name: N/A    Number of Children: N/A  . Years of Education: N/A   Occupational History  . salesman    Social History Main Topics  . Smoking status: Former Smoker -- 0.7 packs/day for 15 years    Quit date: 02/08/2006  . Smokeless tobacco: Not on file  . Alcohol Use: No  . Drug Use: No  . Sexually Active: Not on file   Other Topics Concern  . Not on file   Social History Narrative  . No  narrative on file    Past Surgical History  Procedure Date  . Appendectomy     Family History  Problem Relation Age of Onset  . Diabetes Father   . Heart failure Father   . Cancer Mother     lymphoma  . Diabetes Mother     No Known Allergies  Current Outpatient Prescriptions on File Prior to Visit  Medication Sig Dispense Refill  . aspirin EC 81 MG EC tablet Take 1 tablet (81 mg total) by mouth daily.  30 tablet  0  . carvedilol (COREG) 12.5 MG tablet Take 1 tablet (12.5 mg total) by mouth 2 (two) times daily with a meal.  60 tablet  6  . eszopiclone (LUNESTA) 2 MG TABS Take 2 mg by mouth as needed. Take immediately before bedtime      . furosemide (LASIX) 20 MG tablet Take 1 tablet (20 mg total) by mouth as needed.  30 tablet  3  . losartan (COZAAR) 100 MG tablet Take 1 tablet (100 mg total) by mouth daily.  30 tablet  6  . Multiple Vitamin (MULTIVITAMIN WITH MINERALS) TABS Take 1 tablet by mouth daily.      . nitroGLYCERIN (NITROSTAT) 0.4 MG SL tablet Place 1 tablet (0.4 mg total) under the tongue every 5 (five) minutes as needed for chest pain (CP or SOB).  20 tablet  0  .  spironolactone (ALDACTONE) 25 MG tablet Take 1 tablet (25 mg total) by mouth once.  30 tablet  6  . [DISCONTINUED] glipiZIDE (GLUCOTROL) 5 MG tablet TAKE ONE TABLET BY MOUTH TWICE DAILY BEFORE MEALS  60 tablet  2  . gabapentin (NEURONTIN) 100 MG capsule Take 1 capsule (100 mg total) by mouth 3 (three) times daily.  90 capsule  3    BP 144/80  Pulse 80  Temp 97.8 F (36.6 C) (Oral)  Wt 204 lb (92.534 kg)  SpO2 97%chart    Objective:   Physical Exam  Constitutional: He is oriented to person, place, and time. He appears well-developed and well-nourished.  HENT:  Head: Normocephalic.  Right Ear: External ear normal.  Left Ear: External ear normal.  Mouth/Throat: Oropharynx is clear and moist. No oropharyngeal exudate.  Eyes: Conjunctivae normal and EOM are normal. Pupils are equal, round, and reactive  to light.  Neck: Normal range of motion. Neck supple. No thyromegaly present.  Cardiovascular: Normal rate, regular rhythm and normal heart sounds.   Pulmonary/Chest: Effort normal and breath sounds normal.  Abdominal: Soft. Bowel sounds are normal. There is no tenderness. There is no rebound and no guarding.  Musculoskeletal: Normal range of motion. He exhibits no edema and no tenderness.       Tenderness to palpation of the dorsal aspect of the feet bilaterally. Pulses 2/2. Monofilament intact. Feet skin intact.  Neurological: He is alert and oriented to person, place, and time. He displays abnormal reflex. No cranial nerve deficit. Coordination normal.  Skin: Skin is warm and dry.  Psychiatric: He has a normal mood and affect.          Assessment & Plan:  Assessment: Type 2 diabetes, diabetic neuropathy, hyperlipidemia, insomnia, hypertension  Plan: Advise diabetic eye exam as soon as possible. For feet pain, Neurontin 100 mg 3 times a day as needed. We'll consider adjusting the dose as necessary. Patient is having more fatigue and erectile dysfunction so we'll obtain labs in addition to the normal labs to include testosterone, CMP, lipids, CBC, TSH notify patient pending results. Encouraged healthy diet and exercise. Influenza vaccine administered. Will recheck patient pending the results of his labs, in 3 months, and sooner when necessary.

## 2011-12-22 ENCOUNTER — Other Ambulatory Visit: Payer: Self-pay

## 2011-12-22 MED ORDER — GLUCOSE BLOOD VI STRP: ORAL_STRIP | Status: DC

## 2012-01-18 ENCOUNTER — Encounter (HOSPITAL_COMMUNITY): Payer: Self-pay

## 2012-01-18 ENCOUNTER — Ambulatory Visit (HOSPITAL_COMMUNITY)
Admission: RE | Admit: 2012-01-18 | Discharge: 2012-01-18 | Disposition: A | Discharge status: Home / Self Care | Payer: Self-pay | Source: Ambulatory Visit | Attending: Internal Medicine | Admitting: Internal Medicine

## 2012-01-18 VITALS — BP 132/86 | HR 99 | Ht 72.0 in | Wt 208.8 lb

## 2012-01-18 DIAGNOSIS — I10 Essential (primary) hypertension: Secondary | ICD-10-CM | POA: Insufficient documentation

## 2012-01-18 DIAGNOSIS — I5022 Chronic systolic (congestive) heart failure: Secondary | ICD-10-CM | POA: Insufficient documentation

## 2012-01-18 MED ORDER — CARVEDILOL 12.5 MG PO TABS: 18.7500 mg | ORAL_TABLET | Freq: Two times a day (BID) | ORAL | Status: DC

## 2012-01-18 NOTE — Patient Instructions (Addendum)
Take Carvedilol 18.75 mg at night for 2 weeks then increase day time Carvedilol to 18.75 mg  Do the following things EVERYDAY: 1) Weigh yourself in the morning before breakfast. Write it down and keep it in a log. 2) Take your medicines as prescribed 3) Eat low salt foods-Limit salt (sodium) to 2000 mg per day.  4) Stay as active as you can everyday 5) Limit all fluids for the day to less than 2 liters  Follow up in 2 months

## 2012-01-18 NOTE — Assessment & Plan Note (Addendum)
NYHA I. Volume status stable. Given most recent ECHO results (EF 35-40%) will titrate up carvedilol to 18.75 mg twice a day. If he tolerates up titration will consider increasing Carvedilol at next visit to 25 mg twice a day. Reinforced daily weights, medication compliance, and low salt food choices. Also encouraged to resume daily exercise. Follow up in 2 months.

## 2012-01-18 NOTE — Assessment & Plan Note (Signed)
Continue current regimen

## 2012-01-18 NOTE — Progress Notes (Signed)
Patient ID: Dustin Lozano, male   DOB: 08-06-1960, 51 y.o.   MRN: [id number]  Weight Range    197-200  Baseline    1396 on 08/14/11  PCP: Dr Orvan Falconer  HPI: Dustin Lozano is a 51 y.o. gentlemen with diagnosis of systolic heart failure due to nonischemic cardiomyopathy with an EF 25-30%, DM2 and HTN. He underwent cardiac catheterization on 08/17/11 showing mild nonobstructive coronary artery disease with a 40-50% stenosis in the mid LAD, minimal stenosis in the left circumflex, and nonobstructive disease in the RCA.   Admitted to [facility] 7/6-7/10 due to progressive dyspnea and found to have newly diagnosed cardiomyopathy. EF 25-30% by echo with diffuse hypokinesis. He was diuresed and discharged. RHC 08/17/11  RA 7  RV 36/5  PA 40/17 mean 28  PCWP 22  LV 101/19  AO 102/73  Oxygen saturations:  PA 70  SVC 66  AO 94  Cardiac Output (Fick) 4.4 L/m  Cardiac Index (Fick) 2.1 L/m/m2  12/03/11 ECHO EF 35-40%  He returns for follow up. Feels great.  Denies SOB/PND/Orthopnea/CP.  Occasional dizziness. Weight at home 198-199 pounds. Compliant with medications.  Not exercising.   ROS: All systems negative except as listed in HPI, PMH and Problem List.  Past Medical History  Diagnosis Date  . HTN (hypertension)   . Diabetes mellitus   . CHF (congestive heart failure)     Systolic - LVEF 25-30%  (08/2011)  . Coronary artery disease     Nonobstructive disease per cath 08/2011    Current Outpatient Prescriptions  Medication Sig Dispense Refill  . aspirin EC 81 MG EC tablet Take 1 tablet (81 mg total) by mouth daily.  30 tablet  0  . carvedilol (COREG) 12.5 MG tablet Take 1 tablet (12.5 mg total) by mouth 2 (two) times daily with a meal.  60 tablet  6  . eszopiclone (LUNESTA) 2 MG TABS Take 2 mg by mouth as needed. Take immediately before bedtime      . furosemide (LASIX) 20 MG tablet Take 1 tablet (20 mg total) by mouth as needed.  30 tablet  3  . gabapentin (NEURONTIN) 100 MG capsule Take 1 capsule  (100 mg total) by mouth 3 (three) times daily.  90 capsule  3  . glipiZIDE (GLUCOTROL) 5 MG tablet Take 1 tablet (5 mg total) by mouth 2 (two) times daily before a meal.  60 tablet  5  . glucose blood (FREESTYLE LITE) test strip Use one strip to check glucose twice daily  100 each  3  . losartan (COZAAR) 100 MG tablet Take 1 tablet (100 mg total) by mouth daily.  30 tablet  6  . Multiple Vitamin (MULTIVITAMIN WITH MINERALS) TABS Take 1 tablet by mouth daily.      . nitroGLYCERIN (NITROSTAT) 0.4 MG SL tablet Place 1 tablet (0.4 mg total) under the tongue every 5 (five) minutes as needed for chest pain (CP or SOB).  20 tablet  0  . spironolactone (ALDACTONE) 25 MG tablet Take 1 tablet (25 mg total) by mouth once.  30 tablet  6     PHYSICAL EXAM: Filed Vitals:   01/18/12 0902  BP: 132/86  Pulse: 99  Height: 6' (1.829 m)  Weight: 208 lb 12.8 oz (94.711 kg)  SpO2: 98%    General:  Well appearing. No resp difficulty HEENT: normal Neck: supple. JVP flat. Carotids 2+ bilaterally; no bruits. No lymphadenopathy or thryomegaly appreciated. Cor: PMI normal. Regular rate & rhythm. No rubs,  gallops or murmurs. Lungs: clear Abdomen: soft, nontender, nondistended. No hepatosplenomegaly. No bruits or masses. Good bowel sounds. Extremities: no cyanosis, clubbing, rash, edema Neuro: alert & orientedx3, cranial nerves grossly intact. Moves all 4 extremities w/o difficulty. Affect pleasant.     ASSESSMENT & PLAN:

## 2012-03-06 ENCOUNTER — Other Ambulatory Visit: Payer: Self-pay | Admitting: Family

## 2012-03-20 ENCOUNTER — Ambulatory Visit (HOSPITAL_COMMUNITY)
Admission: RE | Admit: 2012-03-20 | Discharge: 2012-03-20 | Disposition: A | Discharge status: Home / Self Care | Payer: No Typology Code available for payment source | Source: Ambulatory Visit | Attending: Internal Medicine | Admitting: Internal Medicine

## 2012-03-20 ENCOUNTER — Encounter (HOSPITAL_COMMUNITY): Payer: Self-pay

## 2012-03-20 ENCOUNTER — Ambulatory Visit: Payer: Self-pay | Admitting: Family

## 2012-03-20 VITALS — BP 124/84 | HR 93 | Wt 214.8 lb

## 2012-03-20 DIAGNOSIS — I10 Essential (primary) hypertension: Secondary | ICD-10-CM | POA: Insufficient documentation

## 2012-03-20 DIAGNOSIS — I5022 Chronic systolic (congestive) heart failure: Secondary | ICD-10-CM | POA: Insufficient documentation

## 2012-03-20 MED ORDER — CARVEDILOL 12.5 MG PO TABS: 25.0000 mg | ORAL_TABLET | Freq: Two times a day (BID) | ORAL | Status: DC

## 2012-03-20 MED ORDER — CARVEDILOL 25 MG PO TABS: 25.0000 mg | ORAL_TABLET | Freq: Two times a day (BID) | ORAL | Status: DC

## 2012-03-20 NOTE — Assessment & Plan Note (Addendum)
NYHA II.  Volume status looks good.  Weight up but believe it is due to not exercising.  He is going to start walking again once the weather improves.  Will titrate carvedilol 25 mg twice daily.  Discussed use for sliding scale lasix.  Continue losartan, spiro.  Follow up 3 months with repeat echo.

## 2012-03-20 NOTE — Progress Notes (Signed)
Weight Range    197-200  Baseline    1396 on 08/14/11  PCP: Dr Orvan Falconer  HPI: Mr. Jelinski is a 51 y.o. gentlemen with diagnosis of systolic heart failure due to nonischemic cardiomyopathy with an EF 25-30%, DM2 and HTN. He underwent cardiac catheterization on 08/17/11 showing mild nonobstructive coronary artery disease with a 40-50% stenosis in the mid LAD, minimal stenosis in the left circumflex, and nonobstructive disease in the RCA.   Admitted to [facility] 7/6-7/10 due to progressive dyspnea and found to have newly diagnosed cardiomyopathy. EF 25-30% by echo with diffuse hypokinesis. He was diuresed and discharged. RHC 08/17/11  RA 7  RV 36/5  PA 40/17 mean 28  PCWP 22  LV 101/19  AO 102/73  Oxygen saturations:  PA 70  SVC 66  AO 94  Cardiac Output (Fick) 4.4 L/m  Cardiac Index (Fick) 2.1 L/m/m2  12/03/11 ECHO EF 35-40%  He returns for follow up.   He feels ok today but is sore all over for the last 3 days and will see PCP in 2 days.  No chest pain.  Weight climbing due to not exercising.  Is going to get back in it soon.  He denies SOB/orthopnea/PND.  No more dizziness.  Taking lasix once every 2 weeks or so.  Compliant with all other meds.    ROS: All systems negative except as listed in HPI, PMH and Problem List.  Past Medical History  Diagnosis Date  . HTN (hypertension)   . Diabetes mellitus   . CHF (congestive heart failure)     Systolic - LVEF 25-30%  (08/2011)  . Coronary artery disease     Nonobstructive disease per cath 08/2011    Current Outpatient Prescriptions  Medication Sig Dispense Refill  . aspirin EC 81 MG EC tablet Take 1 tablet (81 mg total) by mouth daily.  30 tablet  0  . carvedilol (COREG) 12.5 MG tablet Take 1.5 tablets (18.75 mg total) by mouth 2 (two) times daily with a meal.  90 tablet  6  . furosemide (LASIX) 20 MG tablet Take 1 tablet (20 mg total) by mouth as needed.  30 tablet  3  . gabapentin (NEURONTIN) 100 MG capsule Take 1 capsule (100 mg total) by  mouth 3 (three) times daily.  90 capsule  3  . glipiZIDE (GLUCOTROL) 5 MG tablet Take 1 tablet (5 mg total) by mouth 2 (two) times daily before a meal.  60 tablet  5  . losartan (COZAAR) 100 MG tablet Take 1 tablet (100 mg total) by mouth daily.  30 tablet  6  . LUNESTA 2 MG TABS TAKE ONE TABLET BY MOUTH IMMEDIATELY BEFORE BEDTIME  30 tablet  1  . Multiple Vitamin (MULTIVITAMIN WITH MINERALS) TABS Take 1 tablet by mouth daily.      . nitroGLYCERIN (NITROSTAT) 0.4 MG SL tablet Place 1 tablet (0.4 mg total) under the tongue every 5 (five) minutes as needed for chest pain (CP or SOB).  20 tablet  0  . spironolactone (ALDACTONE) 25 MG tablet Take 1 tablet (25 mg total) by mouth once.  30 tablet  6  . glucose blood (FREESTYLE LITE) test strip Use one strip to check glucose twice daily  100 each  3   No current facility-administered medications for this encounter.     PHYSICAL EXAM: Filed Vitals:   03/20/12 0854  BP: 124/84  Pulse: 93  Weight: 214 lb 12.8 oz (97.433 kg)  SpO2: 98%  General:  Well appearing. No resp difficulty HEENT: normal Neck: supple. JVP flat. Carotids 2+ bilaterally; no bruits. No lymphadenopathy or thryomegaly appreciated. Cor: PMI normal. Regular rate & rhythm. No rubs, gallops or murmurs. Lungs: clear Abdomen: soft, nontender, nondistended. No hepatosplenomegaly. No bruits or masses. Good bowel sounds. Extremities: no cyanosis, clubbing, rash, edema Neuro: alert & orientedx3, cranial nerves grossly intact. Moves all 4 extremities w/o difficulty. Affect pleasant.     ASSESSMENT & PLAN:

## 2012-03-20 NOTE — Assessment & Plan Note (Signed)
Well controlled.  As above, increase carvedilol.  Continue losartan and spiro.

## 2012-03-20 NOTE — Patient Instructions (Addendum)
Increase carvedilol 25 mg (2 tabs) twice daily until you pick up new prescription and then it will be 1 tab daily.  Follow up 2-3 months with echo.    Do the following things EVERYDAY: 1) Weigh yourself in the morning before breakfast. Write it down and keep it in a log. 2) Take your medicines as prescribed 3) Eat low salt foods-Limit salt (sodium) to 2000 mg per day.  4) Stay as active as you can everyday 5) Limit all fluids for the day to less than 2 liters

## 2012-03-22 ENCOUNTER — Ambulatory Visit: Payer: Self-pay | Admitting: Family

## 2012-03-22 ENCOUNTER — Encounter: Payer: Self-pay | Admitting: Family

## 2012-03-22 ENCOUNTER — Ambulatory Visit (INDEPENDENT_AMBULATORY_CARE_PROVIDER_SITE_OTHER): Payer: No Typology Code available for payment source | Admitting: Family

## 2012-03-22 VITALS — BP 130/80 | HR 87 | Wt 216.0 lb

## 2012-03-22 DIAGNOSIS — E1142 Type 2 diabetes mellitus with diabetic polyneuropathy: Secondary | ICD-10-CM

## 2012-03-22 DIAGNOSIS — I10 Essential (primary) hypertension: Secondary | ICD-10-CM

## 2012-03-22 DIAGNOSIS — E1149 Type 2 diabetes mellitus with other diabetic neurological complication: Secondary | ICD-10-CM

## 2012-03-22 DIAGNOSIS — E7800 Pure hypercholesterolemia, unspecified: Secondary | ICD-10-CM

## 2012-03-22 LAB — CBC WITH DIFFERENTIAL/PLATELET
Basophils Absolute: 0.1 10*3/uL (ref 0.0–0.1)
Eosinophils Absolute: 0.8 10*3/uL — ABNORMAL HIGH (ref 0.0–0.7)
Lymphocytes Relative: 17.1 % (ref 12.0–46.0)
MCHC: 34.5 g/dL (ref 30.0–36.0)
Neutrophils Relative %: 68.2 % (ref 43.0–77.0)
Platelets: 319 10*3/uL (ref 150.0–400.0)
RBC: 4.97 Mil/uL (ref 4.22–5.81)
RDW: 12.4 % (ref 11.5–14.6)

## 2012-03-22 LAB — BASIC METABOLIC PANEL
Chloride: 98 mEq/L (ref 96–112)
Potassium: 4 mEq/L (ref 3.5–5.1)
Sodium: 134 mEq/L — ABNORMAL LOW (ref 135–145)

## 2012-03-22 LAB — HEMOGLOBIN A1C: Hgb A1c MFr Bld: 7.5 % — ABNORMAL HIGH (ref 4.6–6.5)

## 2012-03-22 LAB — HEPATIC FUNCTION PANEL
ALT: 31 U/L (ref 0–53)
AST: 29 U/L (ref 0–37)
Bilirubin, Direct: 0 mg/dL (ref 0.0–0.3)
Total Bilirubin: 0.8 mg/dL (ref 0.3–1.2)

## 2012-03-22 LAB — LIPID PANEL
Cholesterol: 167 mg/dL (ref 0–200)
HDL: 30.8 mg/dL — ABNORMAL LOW (ref >39.00)
VLDL: 47.8 mg/dL — ABNORMAL HIGH (ref 0.0–40.0)

## 2012-03-22 NOTE — Patient Instructions (Addendum)
Exercise to Stay Healthy Exercise helps you become and stay healthy. EXERCISE IDEAS AND TIPS Choose exercises that:  You enjoy.  Fit into your day. You do not need to exercise really hard to be healthy. You can do exercises at a slow or medium level and stay healthy. You can:  Stretch before and after working out.  Try yoga, Pilates, or tai chi.  Lift weights.  Walk fast, swim, jog, run, climb stairs, bicycle, dance, or rollerskate.  Take aerobic classes. Exercises that burn about 150 calories:  Running 1  miles in 15 minutes.  Playing volleyball for 45 to 60 minutes.  Washing and waxing a car for 45 to 60 minutes.  Playing touch football for 45 minutes.  Walking 1  miles in 35 minutes.  Pushing a stroller 1  miles in 30 minutes.  Playing basketball for 30 minutes.  Raking leaves for 30 minutes.  Bicycling 5 miles in 30 minutes.  Walking 2 miles in 30 minutes.  Dancing for 30 minutes.  Shoveling snow for 15 minutes.  Swimming laps for 20 minutes.  Walking up stairs for 15 minutes.  Bicycling 4 miles in 15 minutes.  Gardening for 30 to 45 minutes.  Jumping rope for 15 minutes.  Washing windows or floors for 45 to 60 minutes. Document Released: 02/27/2010 Document Revised: 04/19/2011 Document Reviewed: 02/27/2010 [employer] Patient Information 2013 [employer].  

## 2012-03-22 NOTE — Progress Notes (Signed)
Subjective:    Patient ID: Dustin Lozano, male    DOB: 1960/12/30, 52 y.o.   MRN: [id number]  HPI 52 year old white male, nonsmoker, is in for recheck of type 2 diabetes, hypertension, hyperlipidemia. Is currently doing well. Denies any concerns. Fasting blood sugars are between 100-140. Postprandial readings are 152-13. Is not exercising as much as he once was. The plans to resume exercise given his blood sugars being slightly higher than what he is used to.   Review of Systems  Constitutional: Negative.   HENT: Negative.   Respiratory: Negative.   Cardiovascular: Negative.   Gastrointestinal: Negative.   Genitourinary: Negative.   Musculoskeletal: Negative.   Skin: Negative.   Neurological: Negative.   Hematological: Negative.   Psychiatric/Behavioral: Negative.    Past Medical History  Diagnosis Date  . HTN (hypertension)   . Diabetes mellitus   . CHF (congestive heart failure)     Systolic - LVEF 25-30%  (08/2011)  . Coronary artery disease     Nonobstructive disease per cath 08/2011    History   Social History  . Marital Status: Single    Spouse Name: N/A    Number of Children: N/A  . Years of Education: N/A   Occupational History  . salesman    Social History Main Topics  . Smoking status: Former Smoker -- 0.70 packs/day for 15 years    Quit date: 02/08/2006  . Smokeless tobacco: Not on file  . Alcohol Use: No  . Drug Use: No  . Sexually Active: Not on file   Other Topics Concern  . Not on file   Social History Narrative  . No narrative on file    Past Surgical History  Procedure Laterality Date  . Appendectomy      Family History  Problem Relation Age of Onset  . Diabetes Father   . Heart failure Father   . Cancer Mother     lymphoma  . Diabetes Mother     No Known Allergies  Current Outpatient Prescriptions on File Prior to Visit  Medication Sig Dispense Refill  . aspirin EC 81 MG EC tablet Take 1 tablet (81 mg total) by mouth daily.   30 tablet  0  . carvedilol (COREG) 25 MG tablet Take 1 tablet (25 mg total) by mouth 2 (two) times daily with a meal.  60 tablet  6  . furosemide (LASIX) 20 MG tablet Take 1 tablet (20 mg total) by mouth as needed.  30 tablet  3  . gabapentin (NEURONTIN) 100 MG capsule Take 1 capsule (100 mg total) by mouth 3 (three) times daily.  90 capsule  3  . glipiZIDE (GLUCOTROL) 5 MG tablet Take 1 tablet (5 mg total) by mouth 2 (two) times daily before a meal.  60 tablet  5  . glucose blood (FREESTYLE LITE) test strip Use one strip to check glucose twice daily  100 each  3  . losartan (COZAAR) 100 MG tablet Take 1 tablet (100 mg total) by mouth daily.  30 tablet  6  . LUNESTA 2 MG TABS TAKE ONE TABLET BY MOUTH IMMEDIATELY BEFORE BEDTIME  30 tablet  1  . Multiple Vitamin (MULTIVITAMIN WITH MINERALS) TABS Take 1 tablet by mouth daily.      . nitroGLYCERIN (NITROSTAT) 0.4 MG SL tablet Place 1 tablet (0.4 mg total) under the tongue every 5 (five) minutes as needed for chest pain (CP or SOB).  20 tablet  0  . spironolactone (ALDACTONE) 25 MG  tablet Take 1 tablet (25 mg total) by mouth once.  30 tablet  6   No current facility-administered medications on file prior to visit.    BP 130/80  Pulse 87  Wt 216 lb (97.977 kg)  BMI 29.29 kg/m2  SpO2 97%chart    Objective:   Physical Exam  Constitutional: He is oriented to person, place, and time. He appears well-developed and well-nourished.  HENT:  Right Ear: External ear normal.  Left Ear: External ear normal.  Nose: Nose normal.  Mouth/Throat: Oropharynx is clear and moist.  Neck: Normal range of motion. Neck supple.  Cardiovascular: Normal rate.   Pulmonary/Chest: Effort normal and breath sounds normal.  Abdominal: Soft. Bowel sounds are normal.  Musculoskeletal: Normal range of motion.  Neurological: He is alert and oriented to person, place, and time. He has normal reflexes.  Skin: Skin is warm and dry.  Psychiatric: He has a normal mood and  affect.          Assessment & Plan:  Assessment: 1. Type 2 diabetes 2. Hypertension 3. Hyperlipidemia 4. Neuropathy  Plan: Continue current medications. Encouraged healthy diet, exercise. Lab sent. Will notify patient pending results.

## 2012-04-10 ENCOUNTER — Encounter: Payer: Self-pay | Admitting: Family

## 2012-04-10 ENCOUNTER — Ambulatory Visit (INDEPENDENT_AMBULATORY_CARE_PROVIDER_SITE_OTHER): Payer: No Typology Code available for payment source | Admitting: Family

## 2012-04-10 VITALS — BP 132/80 | HR 84 | Temp 97.8°F | Wt 217.0 lb

## 2012-04-10 DIAGNOSIS — J322 Chronic ethmoidal sinusitis: Secondary | ICD-10-CM

## 2012-04-10 MED ORDER — AZITHROMYCIN 250 MG PO TABS: ORAL_TABLET | ORAL | Status: DC

## 2012-04-10 NOTE — Progress Notes (Signed)
Subjective:    Patient ID: Dustin Lozano, male    DOB: 12-10-1960, 52 y.o.   MRN: [id number]  HPI 52 year old white male, nonsmoker is in today with complaints of sneezing, cough, congestion, sinus pressure and pain that's ongoing x4 days. Taken over-the-counter Sudafed that helped some. He is requesting a Z-Pak today.   Review of Systems  Constitutional: Negative.   HENT: Positive for congestion, sneezing, postnasal drip and sinus pressure.   Respiratory: Positive for cough.   Cardiovascular: Negative.   Musculoskeletal: Negative.   Allergic/Immunologic: Negative.   Psychiatric/Behavioral: Negative.    Past Medical History  Diagnosis Date  . HTN (hypertension)   . Diabetes mellitus   . CHF (congestive heart failure)     Systolic - LVEF 25-30%  (08/2011)  . Coronary artery disease     Nonobstructive disease per cath 08/2011    History   Social History  . Marital Status: Single    Spouse Name: N/A    Number of Children: N/A  . Years of Education: N/A   Occupational History  . salesman    Social History Main Topics  . Smoking status: Former Smoker -- 0.70 packs/day for 15 years    Quit date: 02/08/2006  . Smokeless tobacco: Not on file  . Alcohol Use: No  . Drug Use: No  . Sexually Active: Not on file   Other Topics Concern  . Not on file   Social History Narrative  . No narrative on file    Past Surgical History  Procedure Laterality Date  . Appendectomy      Family History  Problem Relation Age of Onset  . Diabetes Father   . Heart failure Father   . Cancer Mother     lymphoma  . Diabetes Mother     No Known Allergies  Current Outpatient Prescriptions on File Prior to Visit  Medication Sig Dispense Refill  . aspirin EC 81 MG EC tablet Take 1 tablet (81 mg total) by mouth daily.  30 tablet  0  . carvedilol (COREG) 25 MG tablet Take 1 tablet (25 mg total) by mouth 2 (two) times daily with a meal.  60 tablet  6  . furosemide (LASIX) 20 MG tablet  Take 1 tablet (20 mg total) by mouth as needed.  30 tablet  3  . gabapentin (NEURONTIN) 100 MG capsule Take 1 capsule (100 mg total) by mouth 3 (three) times daily.  90 capsule  3  . glipiZIDE (GLUCOTROL) 5 MG tablet Take 1 tablet (5 mg total) by mouth 2 (two) times daily before a meal.  60 tablet  5  . glucose blood (FREESTYLE LITE) test strip Use one strip to check glucose twice daily  100 each  3  . losartan (COZAAR) 100 MG tablet Take 1 tablet (100 mg total) by mouth daily.  30 tablet  6  . LUNESTA 2 MG TABS TAKE ONE TABLET BY MOUTH IMMEDIATELY BEFORE BEDTIME  30 tablet  1  . Multiple Vitamin (MULTIVITAMIN WITH MINERALS) TABS Take 1 tablet by mouth daily.      . nitroGLYCERIN (NITROSTAT) 0.4 MG SL tablet Place 1 tablet (0.4 mg total) under the tongue every 5 (five) minutes as needed for chest pain (CP or SOB).  20 tablet  0  . spironolactone (ALDACTONE) 25 MG tablet Take 1 tablet (25 mg total) by mouth once.  30 tablet  6   No current facility-administered medications on file prior to visit.    BP 132/80  Pulse 84  Temp(Src) 97.8 F (36.6 C) (Oral)  Wt 217 lb (98.431 kg)  BMI 29.42 kg/m2  SpO2 99%chart    Objective:   Physical Exam  Constitutional: He is oriented to person, place, and time. He appears well-developed and well-nourished.  HENT:  Right Ear: External ear normal.  Left Ear: External ear normal.  Nose: Nose normal.  Mouth/Throat: Oropharynx is clear and moist.   Sinus tenderness to palpation of the maxillary and ethmoid sinuses.  Neck: Normal range of motion. Neck supple.  Cardiovascular: Normal rate, regular rhythm and normal heart sounds.   Pulmonary/Chest: Effort normal and breath sounds normal.  Neurological: He is alert and oriented to person, place, and time.  Skin: Skin is warm and dry.  Psychiatric: He has a normal mood and affect.          Assessment & Plan:  Assessment:  1. Acute sinusitis  Plan: Z-Pak as directed. Continue Sudafed. Rest. Drink  plenty of fluids. Call the office if symptoms worsen or persist. Recheck a schedule, and as needed.

## 2012-04-10 NOTE — Patient Instructions (Addendum)

## 2012-04-17 ENCOUNTER — Telehealth: Payer: Self-pay | Admitting: Family

## 2012-04-17 NOTE — Telephone Encounter (Signed)
Can schedule with any Wilmont Olund in [place].

## 2012-04-17 NOTE — Telephone Encounter (Signed)
Patient Information:  Caller Name: [place]  Phone: [phone]  Patient: Dustin Lozano, Dustin Lozano  Gender: Male  DOB: 26-Dec-1960  Age: 52 Years  PCP: Adline Mango [facility])  Office Follow Up:  Does the office need to follow up with this patient?: Yes  Instructions For The Office: Disposition:  Go to Office Now.  Appt not available - would work in be possible?  Please review and contact patient at  [phone]   Symptoms  Reason For Call & Symptoms: Symptoms sinus congestion, cough then seen in office on week ago, finishing ZPak, does not feel improved,  Still sinus infection, swollen sinuses, constant sneezing, coughing  up yellow mucus, chest tightness.  Upper Right side back pain.  Has been wheezing  Reviewed Health History In EMR: Yes  Reviewed Medications In EMR: Yes  Reviewed Allergies In EMR: Yes  Reviewed Surgeries / Procedures: Yes  Date of Onset of Symptoms: 04/05/2012  Treatments Tried: ZPak, Sudafed  Treatments Tried Worked: No  Guideline(s) Used:  Sinus Pain and Congestion  Cough  Disposition Per Guideline:   Go to Office Now  Reason For Disposition Reached:   Wheezing is present  Advice Given:  N/A

## 2012-04-17 NOTE — Telephone Encounter (Signed)
Advise re possible work in, per CAN

## 2012-04-17 NOTE — Telephone Encounter (Signed)
Appointment scheduled for [facility] 04/18/12

## 2012-04-18 ENCOUNTER — Ambulatory Visit (INDEPENDENT_AMBULATORY_CARE_PROVIDER_SITE_OTHER): Payer: No Typology Code available for payment source | Admitting: Family

## 2012-04-18 DIAGNOSIS — J209 Acute bronchitis, unspecified: Secondary | ICD-10-CM

## 2012-04-18 MED ORDER — PREDNISONE 20 MG PO TABS: ORAL_TABLET | ORAL | Status: AC

## 2012-04-19 NOTE — Progress Notes (Signed)
  Subjective:    Patient ID: Dustin Lozano, male    DOB: [birth year], 52 y.o.   MRN: [id number]  HPI No office visit Review of Systems     Objective:   Physical Exam        Assessment & Plan:

## 2012-05-22 ENCOUNTER — Telehealth (HOSPITAL_COMMUNITY): Payer: Self-pay | Admitting: Cardiology

## 2012-05-22 DIAGNOSIS — I5022 Chronic systolic (congestive) heart failure: Secondary | ICD-10-CM

## 2012-05-22 NOTE — Telephone Encounter (Signed)
Order placed for upcoming ECHO 

## 2012-06-11 ENCOUNTER — Other Ambulatory Visit: Payer: Self-pay | Admitting: Family

## 2012-06-19 ENCOUNTER — Ambulatory Visit (HOSPITAL_BASED_OUTPATIENT_CLINIC_OR_DEPARTMENT_OTHER)
Admission: RE | Admit: 2012-06-19 | Discharge: 2012-06-19 | Disposition: A | Discharge status: Home / Self Care | Payer: No Typology Code available for payment source | Source: Ambulatory Visit | Attending: Internal Medicine | Admitting: Internal Medicine

## 2012-06-19 ENCOUNTER — Encounter (HOSPITAL_COMMUNITY): Payer: Self-pay

## 2012-06-19 ENCOUNTER — Ambulatory Visit (HOSPITAL_COMMUNITY)
Admission: RE | Admit: 2012-06-19 | Discharge: 2012-06-19 | Disposition: A | Discharge status: Home / Self Care | Payer: No Typology Code available for payment source | Source: Ambulatory Visit | Attending: Internal Medicine | Admitting: Internal Medicine

## 2012-06-19 VITALS — BP 122/82 | HR 72 | Ht 72.0 in | Wt 222.2 lb

## 2012-06-19 DIAGNOSIS — I509 Heart failure, unspecified: Secondary | ICD-10-CM | POA: Insufficient documentation

## 2012-06-19 DIAGNOSIS — I5022 Chronic systolic (congestive) heart failure: Secondary | ICD-10-CM

## 2012-06-19 DIAGNOSIS — R0609 Other forms of dyspnea: Secondary | ICD-10-CM | POA: Insufficient documentation

## 2012-06-19 DIAGNOSIS — I2510 Atherosclerotic heart disease of native coronary artery without angina pectoris: Secondary | ICD-10-CM | POA: Insufficient documentation

## 2012-06-19 DIAGNOSIS — I519 Heart disease, unspecified: Secondary | ICD-10-CM

## 2012-06-19 DIAGNOSIS — I10 Essential (primary) hypertension: Secondary | ICD-10-CM | POA: Insufficient documentation

## 2012-06-19 DIAGNOSIS — E119 Type 2 diabetes mellitus without complications: Secondary | ICD-10-CM | POA: Insufficient documentation

## 2012-06-19 DIAGNOSIS — R0989 Other specified symptoms and signs involving the circulatory and respiratory systems: Secondary | ICD-10-CM | POA: Insufficient documentation

## 2012-06-19 MED ORDER — CARVEDILOL 12.5 MG PO TABS: 18.7500 mg | ORAL_TABLET | Freq: Two times a day (BID) | ORAL | Status: DC

## 2012-06-19 NOTE — Assessment & Plan Note (Addendum)
NYHA I. Dr Gala Romney discussed and reviewed ECHO. EF recovered. Cut back carvedilol to 18.75 mg twice a day due to dizziness.Reinforced medication compliance. Follow up in 6 months.   Patient seen and examined with Tonye Becket, NP. We discussed all aspects of the encounter. I agree with the assessment and plan as stated above.  He is doing very well. Echo reviewed personally and EF ~55%. No clinical HF. Agree with cutting back carvedilol due to dizziness. Continue losartan. We will see him in 6 months. Knows to call with any HF symptoms. Continue lasix only PRN.

## 2012-06-19 NOTE — Progress Notes (Signed)
*  PRELIMINARY RESULTS* Echocardiogram 2D Echocardiogram has been performed.  Dustin Lozano 06/19/2012, 11:14 AM

## 2012-06-19 NOTE — Progress Notes (Signed)
Patient ID: Dustin Lozano, male   DOB: 07-14-1960, 52 y.o.   MRN: [id number]  Weight Range    197-200  Baseline    1396 on 08/14/11  PCP: Dr Orvan Falconer  HPI: Dustin Lozano is a 52 y.o. gentlemen with chronic systolic heart failure (EF 35-40%), NICM,  DM2 and HTN. He is intolerant to Ace Inhibitor due to cough.    08/17/11 RHC/LHC  RA 7  RV 36/5  PA 40/17 mean 28  PCWP 22  LV 101/19  AO 102/73  Oxygen saturations:  PA 70  SVC 66  AO 94  Cardiac Output (Fick) 4.4 L/m  Cardiac Index (Fick) 2.1 L/m/m2  Mild nonobstructive coronary artery disease with a 40-50% stenosis in the mid LAD, minimal stenosis in the left circumflex, and nonobstructive disease in the RCA.   12/03/11 ECHO EF 35-40% 5//12/14 ECHO EF 55%  He returns for follow up.   Last visit carvedilol titrated to 25 mg bid. After increase in carvedilol he noticed numbness in lips and dizziness that last 2 minutes. He tolerates the night time dose without difficulty. Denies SOB/PND/Orthpnea. Weight trending up at home to 214 pounds. He has started walking 2 miles per day. Compliant with medications.     ROS: All systems negative except as listed in HPI, PMH and Problem List.  Past Medical History  Diagnosis Date  . HTN (hypertension)   . Diabetes mellitus   . CHF (congestive heart failure)     Systolic - LVEF 25-30%  (08/2011)  . Coronary artery disease     Nonobstructive disease per cath 08/2011    Current Outpatient Prescriptions  Medication Sig Dispense Refill  . aspirin EC 81 MG EC tablet Take 1 tablet (81 mg total) by mouth daily.  30 tablet  0  . carvedilol (COREG) 25 MG tablet Take 1 tablet (25 mg total) by mouth 2 (two) times daily with a meal.  60 tablet  6  . furosemide (LASIX) 20 MG tablet Take 1 tablet (20 mg total) by mouth as needed.  30 tablet  3  . gabapentin (NEURONTIN) 100 MG capsule TAKE ONE CAPSULE BY MOUTH THREE TIMES DAILY  90 capsule  0  . glipiZIDE (GLUCOTROL) 5 MG tablet Take 1 tablet (5 mg total) by  mouth 2 (two) times daily before a meal.  60 tablet  5  . glucose blood (FREESTYLE LITE) test strip Use one strip to check glucose twice daily  100 each  3  . losartan (COZAAR) 100 MG tablet Take 1 tablet (100 mg total) by mouth daily.  30 tablet  6  . LUNESTA 2 MG TABS TAKE ONE TABLET BY MOUTH IMMEDIATELY BEFORE BEDTIME  30 tablet  1  . Multiple Vitamin (MULTIVITAMIN WITH MINERALS) TABS Take 1 tablet by mouth daily.      . nitroGLYCERIN (NITROSTAT) 0.4 MG SL tablet Place 1 tablet (0.4 mg total) under the tongue every 5 (five) minutes as needed for chest pain (CP or SOB).  20 tablet  0  . spironolactone (ALDACTONE) 25 MG tablet Take 1 tablet (25 mg total) by mouth once.  30 tablet  6   No current facility-administered medications for this encounter.     PHYSICAL EXAM: Filed Vitals:   06/19/12 0916  BP: 122/82  Pulse: 72  Height: 6' (1.829 m)  Weight: 222 lb 4 oz (100.812 kg)  SpO2: 97%    General:  Well appearing. No resp difficulty HEENT: normal Neck: supple. JVP flat. Carotids 2+ bilaterally;  no bruits. No lymphadenopathy or thryomegaly appreciated. Cor: PMI normal. Regular rate & rhythm. No rubs, gallops or murmurs. Lungs: clear Abdomen: soft, nontender, nondistended. No hepatosplenomegaly. No bruits or masses. Good bowel sounds. Extremities: no cyanosis, clubbing, rash, edema Neuro: alert & orientedx3, cranial nerves grossly intact. Moves all 4 extremities w/o difficulty. Affect pleasant.     ASSESSMENT & PLAN:

## 2012-06-19 NOTE — Patient Instructions (Addendum)
Follow up in 6 months  Take carvedilol 18.75 mg twice a day  Do the following things EVERYDAY: 1) Weigh yourself in the morning before breakfast. Write it down and keep it in a log. 2) Take your medicines as prescribed 3) Eat low salt foods-Limit salt (sodium) to 2000 mg per day.  4) Stay as active as you can everyday 5) Limit all fluids for the day to less than 2 liters

## 2012-06-25 ENCOUNTER — Other Ambulatory Visit (HOSPITAL_COMMUNITY): Payer: Self-pay | Admitting: Physician Assistant

## 2012-07-21 ENCOUNTER — Encounter: Payer: Self-pay | Admitting: Family

## 2012-07-21 ENCOUNTER — Ambulatory Visit: Payer: No Typology Code available for payment source | Admitting: Family

## 2012-07-21 ENCOUNTER — Ambulatory Visit (INDEPENDENT_AMBULATORY_CARE_PROVIDER_SITE_OTHER): Payer: No Typology Code available for payment source | Admitting: Family

## 2012-07-21 VITALS — BP 118/80 | HR 95 | Wt 221.0 lb

## 2012-07-21 DIAGNOSIS — I10 Essential (primary) hypertension: Secondary | ICD-10-CM

## 2012-07-21 DIAGNOSIS — E876 Hypokalemia: Secondary | ICD-10-CM

## 2012-07-21 DIAGNOSIS — I509 Heart failure, unspecified: Secondary | ICD-10-CM

## 2012-07-21 DIAGNOSIS — I5022 Chronic systolic (congestive) heart failure: Secondary | ICD-10-CM

## 2012-07-21 LAB — HEPATIC FUNCTION PANEL
ALT: 47 U/L (ref 0–53)
AST: 36 U/L (ref 0–37)
Albumin: 3.9 g/dL (ref 3.5–5.2)
Alkaline Phosphatase: 60 U/L (ref 39–117)
Bilirubin, Direct: 0 mg/dL (ref 0.0–0.3)
Total Bilirubin: 0.9 mg/dL (ref 0.3–1.2)
Total Protein: 7.3 g/dL (ref 6.0–8.3)

## 2012-07-21 LAB — BASIC METABOLIC PANEL
BUN: 18 mg/dL (ref 6–23)
CO2: 25 mEq/L (ref 19–32)
Calcium: 9.3 mg/dL (ref 8.4–10.5)
Chloride: 101 mEq/L (ref 96–112)
Creatinine, Ser: 1 mg/dL (ref 0.4–1.5)
GFR: 84.14 mL/min (ref >60.00)
Glucose, Bld: 184 mg/dL — ABNORMAL HIGH (ref 70–99)
Potassium: 4.2 mEq/L (ref 3.5–5.1)
Sodium: 136 mEq/L (ref 135–145)

## 2012-07-21 LAB — LIPID PANEL: Cholesterol: 170 mg/dL (ref 0–200)

## 2012-07-21 LAB — HEMOGLOBIN A1C: Hgb A1c MFr Bld: 9.8 % — ABNORMAL HIGH (ref 4.6–6.5)

## 2012-07-21 MED ORDER — GLIPIZIDE 5 MG PO TABS: 10.0000 mg | ORAL_TABLET | Freq: Two times a day (BID) | ORAL | Status: DC

## 2012-07-21 MED ORDER — GLIPIZIDE 10 MG PO TABS: 10.0000 mg | ORAL_TABLET | Freq: Two times a day (BID) | ORAL | Status: DC

## 2012-07-21 MED ORDER — FREESTYLE LANCETS MISC: Status: DC

## 2012-07-21 NOTE — Progress Notes (Signed)
Subjective:    Patient ID: Dustin Lozano, male    DOB: Jul 07, 1960, 52 y.o.   MRN: [id number]  HPI   52 year old white male,nonsmoker is in for recheck of type 2 diabetes, hypertension, hyperlipidemia, congestive heart failure and hypokalemia. He reports noticing higher blood sugars over the past several months typically in the low 200s. He's gained about 5 pounds since his last office visit. He reports exercise and about 6 days a week but continues to gain weight. Denies any polyuria or polydipsia  Review of Systems  Constitutional: Negative.   HENT: Negative.   Respiratory: Negative.   Cardiovascular: Negative.   Gastrointestinal: Negative.   Endocrine: Negative.   Genitourinary: Negative.   Musculoskeletal: Negative.   Skin: Negative.   Allergic/Immunologic: Negative.   Neurological: Negative.   Hematological: Negative.   Psychiatric/Behavioral: Negative.    Past Medical History  Diagnosis Date  . HTN (hypertension)   . Diabetes mellitus   . CHF (congestive heart failure)     Systolic - LVEF 25-30%  (08/2011)  . Coronary artery disease     Nonobstructive disease per cath 08/2011    History   Social History  . Marital Status: Single    Spouse Name: N/A    Number of Children: N/A  . Years of Education: N/A   Occupational History  . salesman    Social History Main Topics  . Smoking status: Former Smoker -- 0.70 packs/day for 15 years    Quit date: 02/08/2006  . Smokeless tobacco: Not on file  . Alcohol Use: No  . Drug Use: No  . Sexually Active: Not on file   Other Topics Concern  . Not on file   Social History Narrative  . No narrative on file    Past Surgical History  Procedure Laterality Date  . Appendectomy      Family History  Problem Relation Age of Onset  . Diabetes Father   . Heart failure Father   . Cancer Mother     lymphoma  . Diabetes Mother     No Known Allergies  Current Outpatient Prescriptions on File Prior to Visit  Medication  Sig Dispense Refill  . aspirin EC 81 MG EC tablet Take 1 tablet (81 mg total) by mouth daily.  30 tablet  0  . carvedilol (COREG) 12.5 MG tablet Take 1.5 tablets (18.75 mg total) by mouth 2 (two) times daily with a meal.  90 tablet  6  . furosemide (LASIX) 20 MG tablet Take 1 tablet (20 mg total) by mouth as needed.  30 tablet  3  . gabapentin (NEURONTIN) 100 MG capsule TAKE ONE CAPSULE BY MOUTH THREE TIMES DAILY  90 capsule  0  . glucose blood (FREESTYLE LITE) test strip Use one strip to check glucose twice daily  100 each  3  . losartan (COZAAR) 100 MG tablet TAKE ONE TABLET BY MOUTH EVERY DAY  30 tablet  6  . LUNESTA 2 MG TABS TAKE ONE TABLET BY MOUTH IMMEDIATELY BEFORE BEDTIME  30 tablet  1  . Multiple Vitamin (MULTIVITAMIN WITH MINERALS) TABS Take 1 tablet by mouth daily.      . nitroGLYCERIN (NITROSTAT) 0.4 MG SL tablet Place 1 tablet (0.4 mg total) under the tongue every 5 (five) minutes as needed for chest pain (CP or SOB).  20 tablet  0  . spironolactone (ALDACTONE) 25 MG tablet Take 1 tablet (25 mg total) by mouth once.  30 tablet  6   No current  facility-administered medications on file prior to visit.    BP 118/80  Pulse 95  Wt 221 lb (100.245 kg)  BMI 29.97 kg/m2  SpO2 98%chart    Objective:   Physical Exam  Constitutional: He is oriented to person, place, and time. He appears well-developed and well-nourished.  HENT:  Right Ear: External ear normal.  Left Ear: External ear normal.  Nose: Nose normal.  Mouth/Throat: Oropharynx is clear and moist.  Neck: Normal range of motion. Neck supple. No thyromegaly present.  Cardiovascular: Normal rate, regular rhythm and normal heart sounds.   Pulmonary/Chest: Effort normal and breath sounds normal.  Abdominal: Soft. Bowel sounds are normal.  Musculoskeletal: Normal range of motion.  Neurological: He is alert and oriented to person, place, and time.  Skin: Skin is warm and dry.  Psychiatric: He has a normal mood and affect.           Assessment & Plan:  Assessment:  1. Type 2 diabetes-uncontrolled 2. Hypertension 3. Hypokalemia 4. Congestive heart failure  Plan: Increase glipizide to 10 mg twice a day. Strongly encourage cardiovascular exercise and weight reduction.lab sent. Followup in 3 months and sooner as needed.

## 2012-07-23 ENCOUNTER — Other Ambulatory Visit: Payer: Self-pay | Admitting: Family

## 2012-08-29 ENCOUNTER — Other Ambulatory Visit: Payer: Self-pay

## 2012-08-29 MED ORDER — ESZOPICLONE 2 MG PO TABS: ORAL_TABLET | ORAL | Status: DC

## 2012-09-08 ENCOUNTER — Other Ambulatory Visit: Payer: Self-pay | Admitting: Family

## 2012-10-16 ENCOUNTER — Telehealth: Payer: Self-pay | Admitting: Family

## 2012-10-16 NOTE — Telephone Encounter (Signed)
error 

## 2012-10-19 ENCOUNTER — Telehealth: Payer: Self-pay | Admitting: Family

## 2012-10-19 NOTE — Telephone Encounter (Signed)
Patient's prior auth for Lunesta 2mg  has been denied. Per insurance, patient must  First use zolpidem CR. Thank you.

## 2012-10-20 ENCOUNTER — Ambulatory Visit: Payer: No Typology Code available for payment source | Admitting: Family

## 2012-10-20 MED ORDER — ZOLPIDEM TARTRATE ER 12.5 MG PO TBCR: 12.5000 mg | EXTENDED_RELEASE_TABLET | Freq: Every evening | ORAL | Status: DC | PRN

## 2012-10-20 NOTE — Telephone Encounter (Signed)
Advise patient and see if he is willing to try Ambien CR.

## 2012-10-20 NOTE — Telephone Encounter (Signed)
Please fax

## 2012-10-20 NOTE — Telephone Encounter (Signed)
Pt would like to try Ambien CR

## 2012-10-23 ENCOUNTER — Other Ambulatory Visit: Payer: Self-pay | Admitting: Family

## 2012-10-25 NOTE — Telephone Encounter (Signed)
Pt is willing to try whatever he needs to try and if it doesn't work he will let us know

## 2012-10-25 NOTE — Telephone Encounter (Signed)
Padonda, I am sorry. This patient's insurance is fighting this every step. They now sent me another letter stating they will not cover Ambien, although I have the original letter stating the would. They are insisting patient must try zolpidem IR or generic sonata first. This was not explicitly defined in the original letter. Please submit rx for Ambien or zolpidem IR if patient agrees.

## 2012-10-25 NOTE — Telephone Encounter (Signed)
Please see below and notify patient. Let me know what he decides.

## 2012-10-26 MED ORDER — ZOLPIDEM TARTRATE 10 MG PO TABS: 10.0000 mg | ORAL_TABLET | Freq: Every evening | ORAL | Status: DC | PRN

## 2012-10-26 NOTE — Addendum Note (Signed)
Addended byAdline Mango B on: 10/26/2012 12:14 PM   Modules accepted: Orders

## 2012-10-29 ENCOUNTER — Other Ambulatory Visit (HOSPITAL_COMMUNITY): Payer: Self-pay | Admitting: Internal Medicine

## 2012-12-10 ENCOUNTER — Other Ambulatory Visit: Payer: Self-pay | Admitting: Family

## 2013-01-17 ENCOUNTER — Encounter (HOSPITAL_COMMUNITY): Payer: No Typology Code available for payment source

## 2013-01-22 ENCOUNTER — Other Ambulatory Visit: Payer: Self-pay | Admitting: Family

## 2013-01-28 ENCOUNTER — Other Ambulatory Visit (HOSPITAL_COMMUNITY): Payer: Self-pay | Admitting: Internal Medicine

## 2013-01-28 ENCOUNTER — Other Ambulatory Visit: Payer: Self-pay | Admitting: Family

## 2013-02-18 ENCOUNTER — Other Ambulatory Visit: Payer: Self-pay | Admitting: Family

## 2013-02-21 ENCOUNTER — Encounter (HOSPITAL_COMMUNITY): Payer: No Typology Code available for payment source

## 2013-02-25 ENCOUNTER — Other Ambulatory Visit: Payer: Self-pay | Admitting: Family

## 2013-03-04 ENCOUNTER — Other Ambulatory Visit: Payer: Self-pay | Admitting: Family

## 2013-03-06 ENCOUNTER — Ambulatory Visit (INDEPENDENT_AMBULATORY_CARE_PROVIDER_SITE_OTHER): Payer: BC Managed Care – PPO | Admitting: Family

## 2013-03-06 ENCOUNTER — Other Ambulatory Visit: Payer: Self-pay | Admitting: Family

## 2013-03-06 ENCOUNTER — Encounter: Payer: Self-pay | Admitting: Family

## 2013-03-06 VITALS — BP 122/84 | HR 92 | Wt 214.0 lb

## 2013-03-06 DIAGNOSIS — I10 Essential (primary) hypertension: Secondary | ICD-10-CM

## 2013-03-06 DIAGNOSIS — E1140 Type 2 diabetes mellitus with diabetic neuropathy, unspecified: Secondary | ICD-10-CM

## 2013-03-06 DIAGNOSIS — IMO0002 Reserved for concepts with insufficient information to code with codable children: Secondary | ICD-10-CM

## 2013-03-06 DIAGNOSIS — E1149 Type 2 diabetes mellitus with other diabetic neurological complication: Secondary | ICD-10-CM

## 2013-03-06 DIAGNOSIS — E1142 Type 2 diabetes mellitus with diabetic polyneuropathy: Secondary | ICD-10-CM

## 2013-03-06 DIAGNOSIS — E1165 Type 2 diabetes mellitus with hyperglycemia: Principal | ICD-10-CM

## 2013-03-06 DIAGNOSIS — Z1211 Encounter for screening for malignant neoplasm of colon: Secondary | ICD-10-CM

## 2013-03-06 LAB — HEPATIC FUNCTION PANEL
ALBUMIN: 3.9 g/dL (ref 3.5–5.2)
ALK PHOS: 78 U/L (ref 39–117)
ALT: 36 U/L (ref 0–53)
AST: 29 U/L (ref 0–37)
Bilirubin, Direct: 0 mg/dL (ref 0.0–0.3)
TOTAL PROTEIN: 7.5 g/dL (ref 6.0–8.3)
Total Bilirubin: 1 mg/dL (ref 0.3–1.2)

## 2013-03-06 LAB — BASIC METABOLIC PANEL
BUN: 17 mg/dL (ref 6–23)
CALCIUM: 9.1 mg/dL (ref 8.4–10.5)
CO2: 27 meq/L (ref 19–32)
CREATININE: 1 mg/dL (ref 0.4–1.5)
Chloride: 98 mEq/L (ref 96–112)
GFR: 86.93 mL/min (ref >60.00)
GLUCOSE: 304 mg/dL — AB (ref 70–99)
Potassium: 4 mEq/L (ref 3.5–5.1)
SODIUM: 133 meq/L — AB (ref 135–145)

## 2013-03-06 LAB — LIPID PANEL
CHOL/HDL RATIO: 6
CHOLESTEROL: 193 mg/dL (ref 0–200)
HDL: 33.3 mg/dL — AB (ref >39.00)
VLDL: 102.6 mg/dL — ABNORMAL HIGH (ref 0.0–40.0)

## 2013-03-06 LAB — LDL CHOLESTEROL, DIRECT: Direct LDL: 90.8 mg/dL

## 2013-03-06 LAB — HEMOGLOBIN A1C: Hgb A1c MFr Bld: 11.9 % — ABNORMAL HIGH (ref 4.6–6.5)

## 2013-03-06 MED ORDER — SITAGLIPTIN PHOSPHATE 100 MG PO TABS: 100.0000 mg | ORAL_TABLET | Freq: Every day | ORAL | Status: DC

## 2013-03-06 NOTE — Patient Instructions (Signed)

## 2013-03-06 NOTE — Progress Notes (Signed)
   Subjective:    Patient ID: Dustin Lozano, male    DOB: Jun 13, 1960, 53 y.o.   MRN: [id number]  HPI 53 year old white male, nonsmoker today for recheck of hypertension, uncontrolled type 2 diabetes, and congestive heart failure. He has not been here in approximately 7 months. Blood sugars have been in the 2-300 range. He has not been able to tolerate metformin in the past due to GI upset. Is currently taking glipizide 10 mg one tablet twice a day every day. Denies any concerns today.   Review of Systems  Constitutional: Negative.   HENT: Negative.   Respiratory: Negative.   Cardiovascular: Negative.   Gastrointestinal: Negative.   Endocrine: Negative.   Genitourinary: Negative.   Musculoskeletal: Negative.   Skin: Negative.   Allergic/Immunologic: Negative.   Neurological: Negative.   Hematological: Negative.   Psychiatric/Behavioral: Negative.        Objective:   Physical Exam  Constitutional: He is oriented to person, place, and time. He appears well-developed and well-nourished.  HENT:  Right Ear: External ear normal.  Left Ear: External ear normal.  Nose: Nose normal.  Mouth/Throat: Oropharynx is clear and moist.  Neck: Normal range of motion. Neck supple.  Cardiovascular: Normal rate, regular rhythm and normal heart sounds.   Pulmonary/Chest: Effort normal and breath sounds normal.  Abdominal: Soft. Bowel sounds are normal.  Musculoskeletal: Normal range of motion.  Neurological: He is alert and oriented to person, place, and time.  Skin: Skin is warm and dry.  Psychiatric: He has a normal mood and affect.          Assessment & Plan:  Assessment: 1. Hypertension-continue current medication. Low sodium diet. Healthy diet and exercise. 2. Type 2 diabetes-uncontrolled- Add Farxiva  5mg  once daily. Check blood sugars twice a day. In reading through my chart in 2-3 weeks. Consider increasing dose to 10 mg 3. Congestive heart failure. Continue current medications.  See cardiology as scheduled.

## 2013-03-06 NOTE — Progress Notes (Signed)
Pre visit review using our clinic review tool, if applicable. No additional management support is needed unless otherwise documented below in the visit note. 

## 2013-03-07 ENCOUNTER — Telehealth: Payer: Self-pay | Admitting: Family

## 2013-03-07 NOTE — Telephone Encounter (Signed)
Relevant patient education assigned to patient using Emmi. ° °

## 2013-03-08 ENCOUNTER — Telehealth: Payer: Self-pay

## 2013-03-08 NOTE — Telephone Encounter (Signed)
Relevant patient education assigned to patient using Emmi. ° °

## 2013-03-11 ENCOUNTER — Other Ambulatory Visit: Payer: Self-pay | Admitting: Family

## 2013-03-14 ENCOUNTER — Ambulatory Visit (HOSPITAL_COMMUNITY)
Admission: RE | Admit: 2013-03-14 | Discharge: 2013-03-14 | Disposition: A | Discharge status: Home / Self Care | Payer: BC Managed Care – PPO | Source: Ambulatory Visit | Attending: Internal Medicine | Admitting: Internal Medicine

## 2013-03-14 ENCOUNTER — Encounter (HOSPITAL_COMMUNITY): Payer: Self-pay

## 2013-03-14 VITALS — BP 118/88 | HR 79 | Wt 209.8 lb

## 2013-03-14 DIAGNOSIS — I2510 Atherosclerotic heart disease of native coronary artery without angina pectoris: Secondary | ICD-10-CM | POA: Insufficient documentation

## 2013-03-14 DIAGNOSIS — I509 Heart failure, unspecified: Secondary | ICD-10-CM | POA: Insufficient documentation

## 2013-03-14 DIAGNOSIS — I10 Essential (primary) hypertension: Secondary | ICD-10-CM | POA: Insufficient documentation

## 2013-03-14 DIAGNOSIS — Z794 Long term (current) use of insulin: Secondary | ICD-10-CM | POA: Insufficient documentation

## 2013-03-14 DIAGNOSIS — Z79899 Other long term (current) drug therapy: Secondary | ICD-10-CM | POA: Insufficient documentation

## 2013-03-14 DIAGNOSIS — E119 Type 2 diabetes mellitus without complications: Secondary | ICD-10-CM | POA: Insufficient documentation

## 2013-03-14 DIAGNOSIS — I5022 Chronic systolic (congestive) heart failure: Secondary | ICD-10-CM | POA: Insufficient documentation

## 2013-03-14 NOTE — Patient Instructions (Signed)
Follow up in 1 year with an ECHO 

## 2013-03-14 NOTE — Progress Notes (Signed)
Patient ID: Dustin Lozano, male   DOB: 07/11/1960, 53 y.o.   MRN: [id number] PCP: Dr Megan Salon   Weight Range    197-200  Baseline    1396 on 08/14/11  PCP: Dr Megan Salon  HPI: Dustin Lozano is a 53 y.o. gentlemen with chronic systolic heart failure (EF 35-40%), NICM,  DM2 and HTN. He is intolerant to Ace Inhibitor due to cough.   08/17/11 RHC/LHC  RA 7  RV 36/5  PA 40/17 mean 28  PCWP 22  LV 101/19  AO 102/73  Oxygen saturations:  PA 70  SVC 66  AO 94  Cardiac Output (Fick) 4.4 L/m  Cardiac Index (Fick) 2.1 L/m/m2  Mild nonobstructive coronary artery disease with a 40-50% stenosis in the mid LAD, minimal stenosis in the left circumflex, and nonobstructive disease in the RCA.   12/03/11 ECHO EF 35-40% 5//12/14 ECHO EF 55%  He returns for follow up.   Last visit carvedilol was cut back to 18.75 mg twice a day however he is taking 12.5 mg in am and 25 mg pm. Denies SOB/PND/Orthopnea/CP. Not weighing on a regular basis. He is only taking lasix as needed. Not exercising. Compliant with medications. Cholesterol followed by PCP.   Labs 03/06/13 K 4.0 Creatinine 1.0   ROS: All systems negative except as listed in HPI, PMH and Problem List.  Past Medical History  Diagnosis Date  . HTN (hypertension)   . Diabetes mellitus   . CHF (congestive heart failure)     Systolic - LVEF 99-83%  (04/8248)  . Coronary artery disease     Nonobstructive disease per cath 08/2011    Current Outpatient Prescriptions  Medication Sig Dispense Refill  . aspirin EC 81 MG tablet Take 81 mg by mouth daily.      . carvedilol (COREG) 12.5 MG tablet Take by mouth 2 (two) times daily with a meal. Take 12.5 in am and 25 mg in pm      . FREESTYLE LITE test strip USE ONE STRIP TO CHECK GLUCOSE TWICE DAILY  100 each  0  . furosemide (LASIX) 20 MG tablet Take 1 tablet (20 mg total) by mouth as needed.  30 tablet  3  . gabapentin (NEURONTIN) 100 MG capsule Take 1 capsule (100 mg total) by mouth 3 (three) times daily as  needed.  90 capsule  1  . glipiZIDE (GLUCOTROL) 10 MG tablet TAKE ONE TABLET BY MOUTH TWICE DAILY BEFORE MEAL(S)-NEEDS FOLLOW UP  180 tablet  0  . Lancets (FREESTYLE) lancets Use as instructed  100 each  12  . losartan (COZAAR) 100 MG tablet TAKE ONE TABLET BY MOUTH ONCE DAILY  30 tablet  1  . Multiple Vitamin (MULTIVITAMIN WITH MINERALS) TABS Take 1 tablet by mouth daily.      . nitroGLYCERIN (NITROSTAT) 0.4 MG SL tablet Place 1 tablet (0.4 mg total) under the tongue every 5 (five) minutes as needed for chest pain (CP or SOB).  20 tablet  0  . sitaGLIPtin (JANUVIA) 100 MG tablet Take 1 tablet (100 mg total) by mouth daily.  30 tablet  3  . spironolactone (ALDACTONE) 25 MG tablet TAKE ONE-HALF TABLET BY MOUTH ONCE AS DIRECTED  30 tablet  3  . zolpidem (AMBIEN) 10 MG tablet TAKE ONE TABLET BY MOUTH AT BEDTIME AS NEEDED FOR SLEEP  15 tablet  0   No current facility-administered medications for this encounter.     PHYSICAL EXAM: Filed Vitals:   03/14/13 0841  BP: 118/88  Pulse: 79  Weight: 209 lb 12.8 oz (95.165 kg)  SpO2: 98%    General:  Well appearing. No resp difficulty HEENT: normal Neck: supple. JVP flat. Carotids 2+ bilaterally; no bruits. No lymphadenopathy or thryomegaly appreciated. Cor: PMI normal. Regular rate & rhythm. No rubs, gallops or murmurs. Lungs: clear Abdomen: soft, nontender, nondistended. No hepatosplenomegaly. No bruits or masses. Good bowel sounds. Extremities: no cyanosis, clubbing, rash, edema Neuro: alert & orientedx3, cranial nerves grossly intact. Moves all 4 extremities w/o difficulty. Affect pleasant.     ASSESSMENT & PLAN:  1. Chronic Systolic Heart Failure- NICM Recovered EF 40%>55%  May 2014 . Reviewed Lab work from 03/06/13 Renal function stable.  He is doing great. Volume status stable. Continue lasix as needed and 12.5 mg spironolactone daily.  Continue carvedilol 12.5 mg in am and 25 mg in pm. He prefers to take lower dose in am and continue  full dose in pm. Continue losartan 100 mg daily. He is not on an Ace due to cough.  Encouraged to start exercising again.    Follow up in 1 year with an ECHO   CLEGG,AMY NP-C  8:58 AM

## 2013-03-18 ENCOUNTER — Other Ambulatory Visit (HOSPITAL_COMMUNITY): Payer: Self-pay | Admitting: Physician Assistant

## 2013-03-19 ENCOUNTER — Encounter: Payer: Self-pay | Admitting: Internal Medicine

## 2013-03-28 ENCOUNTER — Encounter: Payer: Self-pay | Admitting: Family

## 2013-03-30 ENCOUNTER — Other Ambulatory Visit (HOSPITAL_COMMUNITY): Payer: Self-pay | Admitting: Internal Medicine

## 2013-04-02 ENCOUNTER — Other Ambulatory Visit: Payer: Self-pay | Admitting: Family

## 2013-04-02 MED ORDER — DAPAGLIFLOZIN PROPANEDIOL 10 MG PO TABS: 10.0000 mg | ORAL_TABLET | Freq: Every day | ORAL | Status: DC

## 2013-05-14 ENCOUNTER — Ambulatory Visit (AMBULATORY_SURGERY_CENTER): Payer: Self-pay | Admitting: *Deleted

## 2013-05-14 VITALS — Ht 72.0 in | Wt 210.0 lb

## 2013-05-14 DIAGNOSIS — Z1211 Encounter for screening for malignant neoplasm of colon: Secondary | ICD-10-CM

## 2013-05-14 MED ORDER — MOVIPREP 100 G PO SOLR: 1.0000 | Freq: Once | ORAL | Status: DC

## 2013-05-14 NOTE — Progress Notes (Signed)
Denies allergies to eggs or soy products. Denies complications with sedation or anesthesia. 

## 2013-05-16 ENCOUNTER — Encounter: Payer: Self-pay | Admitting: Internal Medicine

## 2013-05-27 ENCOUNTER — Other Ambulatory Visit: Payer: Self-pay | Admitting: Family

## 2013-05-28 ENCOUNTER — Encounter: Payer: Self-pay | Admitting: Internal Medicine

## 2013-05-28 ENCOUNTER — Ambulatory Visit (AMBULATORY_SURGERY_CENTER): Payer: BC Managed Care – PPO | Admitting: Internal Medicine

## 2013-05-28 VITALS — BP 103/66 | HR 67 | Temp 98.8°F | Resp 14 | Ht 72.0 in | Wt 210.0 lb

## 2013-05-28 DIAGNOSIS — D126 Benign neoplasm of colon, unspecified: Secondary | ICD-10-CM

## 2013-05-28 DIAGNOSIS — Z1211 Encounter for screening for malignant neoplasm of colon: Secondary | ICD-10-CM

## 2013-05-28 LAB — GLUCOSE, CAPILLARY
Glucose-Capillary: 141 mg/dL — ABNORMAL HIGH (ref 70–99)
Glucose-Capillary: 122 mg/dL — ABNORMAL HIGH (ref 70–99)

## 2013-05-28 MED ORDER — SODIUM CHLORIDE 0.9 % IV SOLN: 500.0000 mL | INTRAVENOUS | Status: DC

## 2013-05-28 NOTE — Op Note (Signed)
[place]  [address]. [place], [address]   COLONOSCOPY PROCEDURE REPORT  PATIENT: Dustin Lozano, Dustin Lozano  MR#: [id number] BIRTHDATE: 06/10/1960 , 52  yrs. old GENDER: Male ENDOSCOPIST: Jerene Bears, MD REFERRED IW:[id number] Megan Salon, FNP-BC PROCEDURE DATE:  05/28/2013 PROCEDURE:   Colonoscopy with snare polypectomy First Screening Colonoscopy - Avg.  risk and is 50 yrs.  old or older Yes.  Prior Negative Screening - Now for repeat screening. N/A  History of Adenoma - Now for follow-up colonoscopy & has been > or = to 3 yrs.  N/A  Polyps Removed Today? Yes. ASA CLASS:   Class III INDICATIONS:average risk screening and first colonoscopy. MEDICATIONS: MAC sedation, administered by CRNA and propofol (Diprivan) 300mg  IV  DESCRIPTION OF PROCEDURE:   After the risks benefits and alternatives of the procedure were thoroughly explained, informed consent was obtained.  A digital rectal exam revealed no rectal mass.   The [id number]  endoscope was introduced through the anus and advanced to the cecum, which was identified by both the appendix and ileocecal valve. No adverse events experienced. The quality of the prep was good, using MoviPrep  The instrument was then slowly withdrawn as the colon was fully examined.   COLON FINDINGS: There was evidence of a normal appearing prior surgical anastomosis at the cecum.   Two sessile polyps ranging between 3-68mm in size were found in the transverse colon and sigmoid colon.  Polypectomy was performed using cold snare.  All resections were complete and all polyp tissue was completely retrieved.   The colon mucosa was otherwise normal.  Retroflexed views revealed internal hemorrhoids. The time to cecum=2 minutes 36 seconds.  Withdrawal time=10 minutes 19 seconds.  The scope was withdrawn and the procedure completed. COMPLICATIONS: There were no complications.  ENDOSCOPIC IMPRESSION: 1.   There was evidence of prior  surgical anastomosis at the cecum 2.   Two sessile polyps ranging between 3-35mm in size were found in the transverse colon and sigmoid colon; Polypectomy was performed using cold snare 3.   The colon mucosa was otherwise normal  RECOMMENDATIONS: 1.  Await pathology results 2.  If the polyps removed today are proven to be adenomatous (pre-cancerous) polyps, you will need a repeat colonoscopy in 5 years.  Otherwise you should continue to follow colorectal cancer screening guidelines for "routine risk" patients with colonoscopy in 10 years.  You will receive a letter within 1-2 weeks with the results of your biopsy as well as final recommendations.  Please call my office if you have not received a letter after 3 weeks.   eSigned:  Jerene Bears, MD 05/28/2013 10:14 AM   cc: The Patient and Roxy Cedar MD   PATIENT NAME:  [name], [name] MR#: [id number]

## 2013-05-28 NOTE — Progress Notes (Signed)
Report to pacu rn, vss, bbs=clear 

## 2013-05-28 NOTE — Progress Notes (Signed)
Called to room to assist during endoscopic procedure.  Patient ID and intended procedure confirmed with present staff. Received instructions for my participation in the procedure from the performing physician.  

## 2013-05-28 NOTE — Patient Instructions (Signed)
YOU HAD AN ENDOSCOPIC PROCEDURE TODAY AT THE [facility]: Refer to the procedure report that was given to you for any specific questions about what was found during the examination.  If the procedure report does not answer your questions, please call your gastroenterologist to clarify.  If you requested that your care partner not be given the details of your procedure findings, then the procedure report has been included in a sealed envelope for you to review at your convenience later.  YOU SHOULD EXPECT: Some feelings of bloating in the abdomen. Passage of more gas than usual.  Walking can help get rid of the air that was put into your GI tract during the procedure and reduce the bloating. If you had a lower endoscopy (such as a colonoscopy or flexible sigmoidoscopy) you may notice spotting of blood in your stool or on the toilet paper. If you underwent a bowel prep for your procedure, then you may not have a normal bowel movement for a few days.  DIET: Your first meal following the procedure should be a light meal and then it is ok to progress to your normal diet.  A half-sandwich or bowl of soup is an example of a good first meal.  Heavy or fried foods are harder to digest and may make you feel nauseous or bloated.  Likewise meals heavy in dairy and vegetables can cause extra gas to form and this can also increase the bloating.  Drink plenty of fluids but you should avoid alcoholic beverages for 24 hours.  ACTIVITY: Your care partner should take you home directly after the procedure.  You should plan to take it easy, moving slowly for the rest of the day.  You can resume normal activity the day after the procedure however you should NOT DRIVE or use heavy machinery for 24 hours (because of the sedation medicines used during the test).    SYMPTOMS TO REPORT IMMEDIATELY: A gastroenterologist can be reached at any hour.  During normal business hours, 8:30 AM to 5:00 PM Monday through Friday,  call [phone].  After hours and on weekends, please call the GI answering service at [phone] who will take a message and have the physician on call contact you.   Following lower endoscopy (colonoscopy or flexible sigmoidoscopy):  Excessive amounts of blood in the stool  Significant tenderness or worsening of abdominal pains  Swelling of the abdomen that is new, acute  Fever of 100F or higher  FOLLOW UP: If any biopsies were taken you will be contacted by phone or by letter within the next 1-3 weeks.  Call your gastroenterologist if you have not heard about the biopsies in 3 weeks.  Our staff will call the home number listed on your records the next business day following your procedure to check on you and address any questions or concerns that you may have at that time regarding the information given to you following your procedure. This is a courtesy call and so if there is no answer at the home number and we have not heard from you through the emergency physician on call, we will assume that you have returned to your regular daily activities without incident.  SIGNATURES/CONFIDENTIALITY: You and/or your care partner have signed paperwork which will be entered into your electronic medical record.  These signatures attest to the fact that that the information above on your After Visit Summary has been reviewed and is understood.  Full responsibility of the confidentiality of this   discharge information lies with you and/or your care-partner.  Polyp information given.  Dr. Hilarie Fredrickson will advise you about your pathology results after May 2.

## 2013-05-29 ENCOUNTER — Telehealth: Payer: Self-pay | Admitting: *Deleted

## 2013-05-29 NOTE — Telephone Encounter (Signed)
  Follow up Call-  Call back number 05/28/2013  Post procedure Call Back phone  # [phone]  Permission to leave phone message Yes     Patient questions:  Do you have a fever, pain , or abdominal swelling? no Pain Score  0 *  Have you tolerated food without any problems? yes  Have you been able to return to your normal activities? yes  Do you have any questions about your discharge instructions: Diet   no Medications  no Follow up visit  no  Do you have questions or concerns about your Care? no  Actions: * If pain score is 4 or above: No action needed, pain <4.

## 2013-06-10 ENCOUNTER — Other Ambulatory Visit (HOSPITAL_COMMUNITY): Payer: Self-pay | Admitting: Internal Medicine

## 2013-06-10 ENCOUNTER — Other Ambulatory Visit: Payer: Self-pay | Admitting: Family

## 2013-06-11 ENCOUNTER — Encounter: Payer: Self-pay | Admitting: Internal Medicine

## 2013-06-24 ENCOUNTER — Other Ambulatory Visit: Payer: Self-pay | Admitting: Family

## 2013-06-24 ENCOUNTER — Other Ambulatory Visit (HOSPITAL_COMMUNITY): Payer: Self-pay | Admitting: Adult Health

## 2013-07-02 ENCOUNTER — Other Ambulatory Visit (HOSPITAL_COMMUNITY): Payer: Self-pay | Admitting: Adult Health

## 2013-07-08 ENCOUNTER — Other Ambulatory Visit (HOSPITAL_COMMUNITY): Payer: Self-pay | Admitting: Adult Health

## 2013-07-29 ENCOUNTER — Other Ambulatory Visit: Payer: Self-pay | Admitting: Family

## 2013-08-09 ENCOUNTER — Ambulatory Visit: Payer: BC Managed Care – PPO | Admitting: Family

## 2013-08-14 ENCOUNTER — Encounter: Payer: Self-pay | Admitting: Family

## 2013-08-14 ENCOUNTER — Ambulatory Visit (INDEPENDENT_AMBULATORY_CARE_PROVIDER_SITE_OTHER): Payer: BC Managed Care – PPO | Admitting: Family

## 2013-08-14 VITALS — BP 114/62 | HR 90 | Ht 72.0 in | Wt 208.0 lb

## 2013-08-14 DIAGNOSIS — I10 Essential (primary) hypertension: Secondary | ICD-10-CM

## 2013-08-14 DIAGNOSIS — E118 Type 2 diabetes mellitus with unspecified complications: Secondary | ICD-10-CM

## 2013-08-14 DIAGNOSIS — D489 Neoplasm of uncertain behavior, unspecified: Secondary | ICD-10-CM

## 2013-08-14 DIAGNOSIS — E785 Hyperlipidemia, unspecified: Secondary | ICD-10-CM

## 2013-08-14 LAB — LIPID PANEL
CHOL/HDL RATIO: 6
Cholesterol: 193 mg/dL (ref 0–200)
HDL: 32.8 mg/dL — ABNORMAL LOW (ref >39.00)
LDL CALC: 86 mg/dL (ref 0–99)
NonHDL: 160.2
Triglycerides: 369 mg/dL — ABNORMAL HIGH (ref 0.0–149.0)
VLDL: 73.8 mg/dL — ABNORMAL HIGH (ref 0.0–40.0)

## 2013-08-14 LAB — COMPREHENSIVE METABOLIC PANEL
ALBUMIN: 4 g/dL (ref 3.5–5.2)
ALK PHOS: 66 U/L (ref 39–117)
ALT: 34 U/L (ref 0–53)
AST: 31 U/L (ref 0–37)
BUN: 17 mg/dL (ref 6–23)
CO2: 27 mEq/L (ref 19–32)
Calcium: 9.3 mg/dL (ref 8.4–10.5)
Chloride: 99 mEq/L (ref 96–112)
Creatinine, Ser: 1 mg/dL (ref 0.4–1.5)
GFR: 86.77 mL/min (ref >60.00)
Glucose, Bld: 181 mg/dL — ABNORMAL HIGH (ref 70–99)
Potassium: 3.9 mEq/L (ref 3.5–5.1)
Sodium: 135 mEq/L (ref 135–145)
TOTAL PROTEIN: 7.5 g/dL (ref 6.0–8.3)
Total Bilirubin: 1 mg/dL (ref 0.2–1.2)

## 2013-08-14 LAB — HEMOGLOBIN A1C: Hgb A1c MFr Bld: 9.9 % — ABNORMAL HIGH (ref 4.6–6.5)

## 2013-08-14 NOTE — Progress Notes (Signed)
Subjective:    Patient ID: Dustin Lozano, male    DOB: 07/22/1960, 53 y.o.   MRN: [id number]  HPI  52 y.o. White male presents today for a diabetes follow up. Pt reports he is still having elevated blood sugars on glipizide and Farxiga, but states he is not having any side effects currently. Pt has chief complaint of "this thing on my skin and wrist". Pt reports an area of redness, swelling, elevation, soreness and shininess to his right wrist. Pt states that it appeared 6 weeks ago and has increased in size, it bleeds occasionally and is painful to touch. Denies fever, fatigue, SOB, and change in appetite.     Review of Systems  Constitutional: Negative.   HENT: Negative.   Eyes: Negative.   Respiratory: Negative.   Cardiovascular: Negative.   Gastrointestinal: Negative.   Endocrine: Negative.   Musculoskeletal: Negative.   Skin: Positive for color change and wound.       Raised, sore, red area to right wrist.   Allergic/Immunologic: Negative.   Neurological: Negative.   Hematological: Negative.   Psychiatric/Behavioral: Negative.    Past Medical History  Diagnosis Date  . HTN (hypertension)   . Diabetes mellitus   . CHF (congestive heart failure)     Systolic - LVEF 57-84%  (07/9627)  . Coronary artery disease     Nonobstructive disease per cath 08/2011    History   Social History  . Marital Status: Single    Spouse Name: N/A    Number of Children: N/A  . Years of Education: N/A   Occupational History  . salesman    Social History Main Topics  . Smoking status: Former Smoker -- 0.70 packs/day for 15 years    Quit date: 02/08/2006  . Smokeless tobacco: Never Used  . Alcohol Use: Yes     Comment: rare  . Drug Use: No  . Sexual Activity: Not on file   Other Topics Concern  . Not on file   Social History Narrative  . No narrative on file    Past Surgical History  Procedure Laterality Date  . Appendectomy      Family History  Problem Relation Age of  Onset  . Diabetes Father   . Heart failure Father   . Cancer Mother     lymphoma  . Diabetes Mother   . Colon cancer Neg Hx   . Esophageal cancer Neg Hx   . Rectal cancer Neg Hx   . Stomach cancer Neg Hx     No Known Allergies  Current Outpatient Prescriptions on File Prior to Visit  Medication Sig Dispense Refill  . aspirin EC 81 MG tablet Take 81 mg by mouth daily.      . carvedilol (COREG) 12.5 MG tablet TAKE ONE & ONE-HALF TABLETS BY MOUTH TWICE DAILY WITH A MEAL  90 tablet  0  . carvedilol (COREG) 25 MG tablet Take 1/2 tab in AM and 1 tab in PM  45 tablet  3  . FARXIGA 10 MG TABS TAKE ONE TABLET BY MOUTH ONCE DAILY.  30 tablet  0  . FREESTYLE LITE test strip USE ONE STRIP TO CHECK GLUCOSE TWICE DAILY.  100 each  2  . furosemide (LASIX) 20 MG tablet Take 1 tablet (20 mg total) by mouth as needed.  30 tablet  3  . gabapentin (NEURONTIN) 100 MG capsule TAKE ONE CAPSULE BY MOUTH THREE TIMES DAILY AS NEEDED.  90 capsule  0  .  glipiZIDE (GLUCOTROL) 10 MG tablet TAKE ONE TABLET BY MOUTH TWICE DAILY BEFORE MEAL(S). NEED FOLLOW UP  180 tablet  0  . Lancets (FREESTYLE) lancets Use as instructed  100 each  12  . loratadine (CLARITIN) 10 MG tablet Take 10 mg by mouth daily.      Marland Kitchen losartan (COZAAR) 100 MG tablet Take 1 tablet (100 mg total) by mouth daily.  30 tablet  6  . Multiple Vitamin (MULTIVITAMIN WITH MINERALS) TABS Take 1 tablet by mouth daily.      Marland Kitchen spironolactone (ALDACTONE) 25 MG tablet Take 0.5 tablets (12.5 mg total) by mouth daily.  15 tablet  6  . zolpidem (AMBIEN) 10 MG tablet TAKE ONE TABLET BY MOUTH AT BEDTIME AS NEEDED FOR SLEEP  15 tablet  0  . nitroGLYCERIN (NITROSTAT) 0.4 MG SL tablet Place 1 tablet (0.4 mg total) under the tongue every 5 (five) minutes as needed for chest pain (CP or SOB).  20 tablet  0   No current facility-administered medications on file prior to visit.    BP 114/62  Pulse 90  Ht 6' (1.829 m)  Wt 208 lb (94.348 kg)  BMI 28.20 kg/m2chart      Objective:   Physical Exam  Constitutional: He is oriented to person, place, and time. He appears well-developed and well-nourished. He is active.  Cardiovascular: Normal rate, regular rhythm, normal heart sounds and normal pulses.   Pulmonary/Chest: Effort normal and breath sounds normal.  Abdominal: Soft. Normal appearance and bowel sounds are normal.  Neurological: He is alert and oriented to person, place, and time.  Skin: Skin is warm and dry. Rash noted. Rash is papular. There is erythema.  Papular, erythematous, dome like lesions approximately 1cm in size. Currently non tender to palpation.   Psychiatric: He has a normal mood and affect. His speech is normal and behavior is normal. Thought content normal. Cognition and memory are normal.     Informed consent was given by the patient for a shave biopsy. The site was prepped with Betadine and using a 15 blade a 1 cm shave biopsy was obtained. The specimin was placed in preservative and sent for pathology. Hemostasis was achieved with a compression. Wound care was discussed with the patient. The patient was informed that it would be one to 2 weeks before the pathology will be interpreted.     Assessment & Plan:  Dustin Lozano was seen today for follow-up.  Diagnoses and associated orders for this visit:  Type 2 diabetes mellitus with complication - Hemoglobin A1c - CMP  Other and unspecified hyperlipidemia - Lipid Panel  Unspecified essential hypertension - CMP  Neoplasm of uncertain behavior - Dermatology pathology   Education: Use sunscreen whenever outside. Place hydrocortisone to dry area around lips. Continue to check blood glucose and exercise.   Follow up: 3 months.

## 2013-08-14 NOTE — Patient Instructions (Addendum)
Put hydrocortisone cream on dry area around lips. Increase water intake to stay well hydrated.      Type 2 Diabetes Mellitus, Adult Type 2 diabetes mellitus, often simply referred to as type 2 diabetes, is a long-lasting (chronic) disease. In type 2 diabetes, the pancreas does not make enough insulin (a hormone), the cells are less responsive to the insulin that is made (insulin resistance), or both. Normally, insulin moves sugars from food into the tissue cells. The tissue cells use the sugars for energy. The lack of insulin or the lack of normal response to insulin causes excess sugars to build up in the blood instead of going into the tissue cells. As a result, high blood sugar (hyperglycemia) develops. The effect of high sugar (glucose) levels can cause many complications. Type 2 diabetes was also previously called adult-onset diabetes but it can occur at any age.  RISK FACTORS  A person is predisposed to developing type 2 diabetes if someone in the family has the disease and also has one or more of the following primary risk factors:  Overweight.  An inactive lifestyle.  A history of consistently eating high-calorie foods. Maintaining a normal weight and regular physical activity can reduce the chance of developing type 2 diabetes. SYMPTOMS  A person with type 2 diabetes may not show symptoms initially. The symptoms of type 2 diabetes appear slowly. The symptoms include:  Increased thirst (polydipsia).  Increased urination (polyuria).  Increased urination during the night (nocturia).  Weight loss. This weight loss may be rapid.  Frequent, recurring infections.  Tiredness (fatigue).  Weakness.  Vision changes, such as blurred vision.  Fruity smell to your breath.  Abdominal pain.  Nausea or vomiting.  Cuts or bruises which are slow to heal.  Tingling or numbness in the hands or feet. DIAGNOSIS Type 2 diabetes is frequently not diagnosed until complications of  diabetes are present. Type 2 diabetes is diagnosed when symptoms or complications are present and when blood glucose levels are increased. Your blood glucose level may be checked by one or more of the following blood tests:  A fasting blood glucose test. You will not be allowed to eat for at least 8 hours before a blood sample is taken.  A random blood glucose test. Your blood glucose is checked at any time of the day regardless of when you ate.  A hemoglobin A1c blood glucose test. A hemoglobin A1c test provides information about blood glucose control over the previous 3 months.  An oral glucose tolerance test (OGTT). Your blood glucose is measured after you have not eaten (fasted) for 2 hours and then after you drink a glucose-containing beverage. TREATMENT   You may need to take insulin or diabetes medicine daily to keep blood glucose levels in the desired range.  If you use insulin, you may need to adjust the dosage depending on the carbohydrates that you eat with each meal or snack. The treatment goal is to maintain the before meal blood sugar (preprandial glucose) level at 70-130 mg/dL. HOME CARE INSTRUCTIONS   Have your hemoglobin A1c level checked twice a year.  Perform daily blood glucose monitoring as directed by your health care provider.  Monitor urine ketones when you are ill and as directed by your health care provider.  Take your diabetes medicine or insulin as directed by your health care provider to maintain your blood glucose levels in the desired range.  Never run out of diabetes medicine or insulin. It is needed every day.  If you are using insulin, you may need to adjust the amount of insulin given based on your intake of carbohydrates. Carbohydrates can raise blood glucose levels but need to be included in your diet. Carbohydrates provide vitamins, minerals, and fiber which are an essential part of a healthy diet. Carbohydrates are found in fruits, vegetables, whole  grains, dairy products, legumes, and foods containing added sugars.  Eat healthy foods. You should make an appointment to see a registered dietitian to help you create an eating plan that is right for you.  Lose weight if overweight.  Carry a medical alert card or wear your medical alert jewelry.  Carry a 15 gram carbohydrate snack with you at all times to treat low blood glucose (hypoglycemia). Some examples of 15 gram carbohydrate snacks include:  Glucose tablets, 3 or 4  Raisins, 2 tablespoons (24 grams)  Jelly beans, 6  Animal crackers, 8  Regular pop, 4 ounces (120 mL)  Gummy treats, 9  Recognize hypoglycemia. Hypoglycemia occurs with blood glucose levels of 70 mg/dL and below. The risk for hypoglycemia increases when fasting or skipping meals, during or after intense exercise, and during sleep. Hypoglycemia symptoms can include:  Tremors or shakes.  Decreased ability to concentrate.  Sweating.  Increased heart rate.  Headache.  Dry mouth.  Hunger.  Irritability.  Anxiety.  Restless sleep.  Altered speech or coordination.  Confusion.  Treat hypoglycemia promptly. If you are alert and able to safely swallow, follow the 15:15 rule:  Take 15-20 grams of rapid-acting glucose or carbohydrate. Rapid-acting options include glucose gel, glucose tablets, or 4 ounces (120 mL) of fruit juice, regular soda, or low fat milk.  Check your blood glucose level 15 minutes after taking the glucose.  Take 15-20 grams more of glucose if the repeat blood glucose level is still 70 mg/dL or below.  Eat a meal or snack within 1 hour once blood glucose levels return to normal.  Be alert to feeling very thirsty and urinating more frequently than usual, which are early signs of hyperglycemia. An early awareness of hyperglycemia allows for prompt treatment. Treat hyperglycemia as directed by your health care provider.  Engage in at least 150 minutes of moderate-intensity physical  activity a week, spread over at least 3 days of the week or as directed by your health care provider. In addition, you should engage in resistance exercise at least 2 times a week or as directed by your health care provider.  Adjust your medicine and food intake as needed if you start a new exercise or sport.  Follow your sick day plan at any time you are unable to eat or drink as usual.  Avoid tobacco use.  Limit alcohol intake to no more than 1 drink per day for nonpregnant women and 2 drinks per day for men. You should drink alcohol only when you are also eating food. Talk with your health care provider whether alcohol is safe for you. Tell your health care provider if you drink alcohol several times a week.  Follow up with your health care provider regularly.  Schedule an eye exam soon after the diagnosis of type 2 diabetes and then annually.  Perform daily skin and foot care. Examine your skin and feet daily for cuts, bruises, redness, nail problems, bleeding, blisters, or sores. A foot exam by a health care provider should be done annually.  Brush your teeth and gums at least twice a day and floss at least once a day. Follow up  with your dentist regularly.  Share your diabetes management plan with your workplace or school.  Stay up-to-date with immunizations.  Learn to manage stress.  Obtain ongoing diabetes education and support as needed.  Participate in, or seek rehabilitation as needed to maintain or improve independence and quality of life. Request a physical or occupational therapy referral if you are having foot or hand numbness or difficulties with grooming, dressing, eating, or physical activity. SEEK MEDICAL CARE IF:   You are unable to eat food or drink fluids for more than 6 hours.  You have nausea and vomiting for more than 6 hours.  Your blood glucose level is over 240 mg/dL.  There is a change in mental status.  You develop an additional serious  illness.  You have diarrhea for more than 6 hours.  You have been sick or have had a fever for a couple of days and are not getting better.  You have pain during any physical activity.  SEEK IMMEDIATE MEDICAL CARE IF:  You have difficulty breathing.  You have moderate to large ketone levels. MAKE SURE YOU:  Understand these instructions.  Will watch your condition.  Will get help right away if you are not doing well or get worse. Document Released: 01/25/2005 Document Revised: 01/30/2013 Document Reviewed: 08/24/2011 [facility] Patient Information 2015 [place], [place]. This information is not intended to replace advice given to you by your health care provider. Make sure you discuss any questions you have with your health care provider.

## 2013-08-14 NOTE — Progress Notes (Signed)
Pre visit review using our clinic review tool, if applicable. No additional management support is needed unless otherwise documented below in the visit note. 

## 2013-08-16 ENCOUNTER — Other Ambulatory Visit: Payer: Self-pay | Admitting: Family

## 2013-08-16 DIAGNOSIS — IMO0002 Reserved for concepts with insufficient information to code with codable children: Secondary | ICD-10-CM

## 2013-08-26 ENCOUNTER — Other Ambulatory Visit: Payer: Self-pay | Admitting: Family

## 2013-09-02 ENCOUNTER — Other Ambulatory Visit: Payer: Self-pay | Admitting: Family

## 2013-09-16 ENCOUNTER — Other Ambulatory Visit: Payer: Self-pay | Admitting: Family

## 2013-09-17 NOTE — Telephone Encounter (Signed)
Ok to fill 

## 2013-09-20 ENCOUNTER — Other Ambulatory Visit: Payer: Self-pay | Admitting: Family

## 2013-10-07 ENCOUNTER — Other Ambulatory Visit (HOSPITAL_COMMUNITY): Payer: Self-pay | Admitting: Internal Medicine

## 2013-10-14 ENCOUNTER — Other Ambulatory Visit: Payer: Self-pay | Admitting: Family

## 2013-10-28 ENCOUNTER — Other Ambulatory Visit (HOSPITAL_COMMUNITY): Payer: Self-pay | Admitting: Internal Medicine

## 2013-10-28 DIAGNOSIS — I509 Heart failure, unspecified: Secondary | ICD-10-CM

## 2013-11-25 ENCOUNTER — Other Ambulatory Visit: Payer: Self-pay | Admitting: Family

## 2013-12-02 ENCOUNTER — Other Ambulatory Visit: Payer: Self-pay | Admitting: Family

## 2013-12-10 ENCOUNTER — Telehealth: Payer: Self-pay | Admitting: Family

## 2013-12-10 NOTE — Telephone Encounter (Signed)
[facility] [place], [place] - [address] Is requesting re-fill on zolpidem (AMBIEN) 10 MG tablet

## 2013-12-11 MED ORDER — ZOLPIDEM TARTRATE 10 MG PO TABS: ORAL_TABLET | ORAL | Status: DC

## 2013-12-11 NOTE — Telephone Encounter (Signed)
Done

## 2013-12-18 ENCOUNTER — Telehealth: Payer: Self-pay

## 2013-12-18 MED ORDER — ZOLPIDEM TARTRATE 10 MG PO TABS: ORAL_TABLET | ORAL | Status: DC

## 2013-12-18 NOTE — Telephone Encounter (Signed)
Incoming fax stating pharmacy did not receive faxed refill request sent on 12/11/13.   Rx phoned in

## 2013-12-30 ENCOUNTER — Other Ambulatory Visit (HOSPITAL_COMMUNITY): Payer: Self-pay | Admitting: Internal Medicine

## 2013-12-30 DIAGNOSIS — I5022 Chronic systolic (congestive) heart failure: Secondary | ICD-10-CM

## 2014-01-09 ENCOUNTER — Ambulatory Visit (INDEPENDENT_AMBULATORY_CARE_PROVIDER_SITE_OTHER): Payer: BC Managed Care – PPO | Admitting: Family

## 2014-01-09 ENCOUNTER — Encounter: Payer: Self-pay | Admitting: Family

## 2014-01-09 VITALS — BP 116/82 | HR 89 | Temp 98.3°F | Ht 71.0 in | Wt 205.2 lb

## 2014-01-09 DIAGNOSIS — E1165 Type 2 diabetes mellitus with hyperglycemia: Secondary | ICD-10-CM

## 2014-01-09 DIAGNOSIS — IMO0002 Reserved for concepts with insufficient information to code with codable children: Secondary | ICD-10-CM

## 2014-01-09 DIAGNOSIS — Z0000 Encounter for general adult medical examination without abnormal findings: Secondary | ICD-10-CM

## 2014-01-09 DIAGNOSIS — Z125 Encounter for screening for malignant neoplasm of prostate: Secondary | ICD-10-CM

## 2014-01-09 DIAGNOSIS — Z23 Encounter for immunization: Secondary | ICD-10-CM

## 2014-01-09 DIAGNOSIS — L600 Ingrowing nail: Secondary | ICD-10-CM

## 2014-01-09 DIAGNOSIS — I10 Essential (primary) hypertension: Secondary | ICD-10-CM

## 2014-01-09 DIAGNOSIS — E785 Hyperlipidemia, unspecified: Secondary | ICD-10-CM

## 2014-01-09 LAB — CBC WITH DIFFERENTIAL/PLATELET
BASOS ABS: 0.1 10*3/uL (ref 0.0–0.1)
Basophils Relative: 0.8 % (ref 0.0–3.0)
EOS ABS: 0.5 10*3/uL (ref 0.0–0.7)
Eosinophils Relative: 5.1 % — ABNORMAL HIGH (ref 0.0–5.0)
HEMATOCRIT: 49 % (ref 39.0–52.0)
Hemoglobin: 16.5 g/dL (ref 13.0–17.0)
Lymphocytes Relative: 23 % (ref 12.0–46.0)
Lymphs Abs: 2.4 10*3/uL (ref 0.7–4.0)
MCHC: 33.8 g/dL (ref 30.0–36.0)
MCV: 85.4 fl (ref 78.0–100.0)
MONO ABS: 0.8 10*3/uL (ref 0.1–1.0)
MONOS PCT: 7.2 % (ref 3.0–12.0)
Neutro Abs: 6.8 10*3/uL (ref 1.4–7.7)
Neutrophils Relative %: 63.9 % (ref 43.0–77.0)
Platelets: 281 10*3/uL (ref 150.0–400.0)
RBC: 5.74 Mil/uL (ref 4.22–5.81)
RDW: 12.8 % (ref 11.5–15.5)
WBC: 10.6 10*3/uL — ABNORMAL HIGH (ref 4.0–10.5)

## 2014-01-09 LAB — LIPID PANEL
Cholesterol: 192 mg/dL (ref 0–200)
HDL: 28.7 mg/dL — AB (ref >39.00)
NONHDL: 163.3
Total CHOL/HDL Ratio: 7
Triglycerides: 389 mg/dL — ABNORMAL HIGH (ref 0.0–149.0)
VLDL: 77.8 mg/dL — AB (ref 0.0–40.0)

## 2014-01-09 LAB — HEPATIC FUNCTION PANEL
ALT: 33 U/L (ref 0–53)
AST: 26 U/L (ref 0–37)
Albumin: 3.9 g/dL (ref 3.5–5.2)
Alkaline Phosphatase: 82 U/L (ref 39–117)
BILIRUBIN DIRECT: 0.2 mg/dL (ref 0.0–0.3)
BILIRUBIN TOTAL: 0.9 mg/dL (ref 0.2–1.2)
Total Protein: 7.1 g/dL (ref 6.0–8.3)

## 2014-01-09 LAB — POCT URINALYSIS DIPSTICK
Bilirubin, UA: NEGATIVE
Ketones, UA: NEGATIVE
Leukocytes, UA: NEGATIVE
Nitrite, UA: NEGATIVE
PH UA: 5
PROTEIN UA: NEGATIVE
RBC UA: NEGATIVE
Spec Grav, UA: <=1.005
UROBILINOGEN UA: 0.2

## 2014-01-09 LAB — HEMOGLOBIN A1C: HEMOGLOBIN A1C: 11 % — AB (ref 4.6–6.5)

## 2014-01-09 LAB — PSA: PSA: 1.24 ng/mL (ref 0.10–4.00)

## 2014-01-09 LAB — BASIC METABOLIC PANEL
BUN: 17 mg/dL (ref 6–23)
CO2: 26 mEq/L (ref 19–32)
CREATININE: 1 mg/dL (ref 0.4–1.5)
Calcium: 9.1 mg/dL (ref 8.4–10.5)
Chloride: 101 mEq/L (ref 96–112)
GFR: 83.64 mL/min (ref >60.00)
Glucose, Bld: 231 mg/dL — ABNORMAL HIGH (ref 70–99)
Potassium: 4 mEq/L (ref 3.5–5.1)
Sodium: 135 mEq/L (ref 135–145)

## 2014-01-09 LAB — TSH: TSH: 1.32 u[IU]/mL (ref 0.35–4.50)

## 2014-01-09 LAB — LDL CHOLESTEROL, DIRECT: Direct LDL: 93.3 mg/dL

## 2014-01-09 MED ORDER — CLOTRIMAZOLE-BETAMETHASONE 1-0.05 % EX CREA: 1.0000 "application " | TOPICAL_CREAM | Freq: Two times a day (BID) | CUTANEOUS | Status: DC

## 2014-01-09 NOTE — Patient Instructions (Signed)
Diabetes and Exercise Exercising regularly is important. It is not just about losing weight. It has many health benefits, such as:  Improving your overall fitness, flexibility, and endurance.  Increasing your bone density.  Helping with weight control.  Decreasing your body fat.  Increasing your muscle strength.  Reducing stress and tension.  Improving your overall health. People with diabetes who exercise gain additional benefits because exercise:  Reduces appetite.  Improves the body's use of blood sugar (glucose).  Helps lower or control blood glucose.  Decreases blood pressure.  Helps control blood lipids (such as cholesterol and triglycerides).  Improves the body's use of the hormone insulin by:  Increasing the body's insulin sensitivity.  Reducing the body's insulin needs.  Decreases the risk for heart disease because exercising:  Lowers cholesterol and triglycerides levels.  Increases the levels of good cholesterol (such as high-density lipoproteins [HDL]) in the body.  Lowers blood glucose levels. YOUR ACTIVITY PLAN  Choose an activity that you enjoy and set realistic goals. Your health care provider or diabetes educator can help you make an activity plan that works for you. Exercise regularly as directed by your health care provider. This includes:  Performing resistance training twice a week such as push-ups, sit-ups, lifting weights, or using resistance bands.  Performing 150 minutes of cardio exercises each week such as walking, running, or playing sports.  Staying active and spending no more than 90 minutes at one time being inactive. Even short bursts of exercise are good for you. Three 10-minute sessions spread throughout the day are just as beneficial as a single 30-minute session. Some exercise ideas include:  Taking the dog for a walk.  Taking the stairs instead of the elevator.  Dancing to your favorite song.  Doing an exercise  video.  Doing your favorite exercise with a friend. RECOMMENDATIONS FOR EXERCISING WITH TYPE 1 OR TYPE 2 DIABETES   Check your blood glucose before exercising. If blood glucose levels are greater than 240 mg/dL, check for urine ketones. Do not exercise if ketones are present.  Avoid injecting insulin into areas of the body that are going to be exercised. For example, avoid injecting insulin into:  The arms when playing tennis.  The legs when jogging.  Keep a record of:  Food intake before and after you exercise.  Expected peak times of insulin action.  Blood glucose levels before and after you exercise.  The type and amount of exercise you have done.  Review your records with your health care provider. Your health care provider will help you to develop guidelines for adjusting food intake and insulin amounts before and after exercising.  If you take insulin or oral hypoglycemic agents, watch for signs and symptoms of hypoglycemia. They include:  Dizziness.  Shaking.  Sweating.  Chills.  Confusion.  Drink plenty of water while you exercise to prevent dehydration or heat stroke. Body water is lost during exercise and must be replaced.  Talk to your health care provider before starting an exercise program to make sure it is safe for you. Remember, almost any type of activity is better than none. Document Released: 04/17/2003 Document Revised: 06/11/2013 Document Reviewed: 07/04/2012 [employer] Patient Information 2015 [employer]. This information is not intended to replace advice given to you by your health care provider. Make sure you discuss any questions you have with your health care provider.  

## 2014-01-09 NOTE — Progress Notes (Signed)
Subjective:    Patient ID: Dustin Lozano, male    DOB: Sep 08, 1960, 53 y.o.   MRN: [id number]  HPI 53 year old white male, nonsmoker with a history of type 2 diabetes-uncontrolled, hypertension, hyperlipidemia, and insomnia. Reports blood glucose running in the 200s typically. Had a diabetic eye exam 2 weeks ago that was normal. Colonoscopy April 2015 that was normal. Does not routinely exercise or following any particular diet.  All immunizations and health maintenance protocols were reviewed with the patient and needed orders were placed.  Appropriate screening laboratory values were ordered for the patient including screening of hyperlipidemia, renal function and hepatic function. If indicated by BPH, a PSA was ordered.  Medication reconciliation,  past medical history, social history, problem list and allergies were reviewed in detail with the patient  Goals were established with regard to weight loss, exercise, and  diet in compliance with medications  End of life planning was discussed.    Review of Systems  Constitutional: Negative.   HENT: Negative.   Eyes: Negative.   Respiratory: Negative.   Cardiovascular: Negative.   Gastrointestinal: Negative.   Endocrine: Negative.  Negative for polydipsia, polyphagia and polyuria.  Genitourinary: Negative.   Musculoskeletal: Negative.   Skin: Positive for rash.       Rash under the right eye  Ingrown toenail: right great toe.   Allergic/Immunologic: Negative.   Neurological: Negative.   Hematological: Negative.   Psychiatric/Behavioral: Negative.    Past Medical History  Diagnosis Date  . HTN (hypertension)   . Diabetes mellitus   . CHF (congestive heart failure)     Systolic - LVEF 29-92%  (05/2681)  . Coronary artery disease     Nonobstructive disease per cath 08/2011    History   Social History  . Marital Status: Single    Spouse Name: N/A    Number of Children: N/A  . Years of Education: N/A   Occupational  History  . salesman    Social History Main Topics  . Smoking status: Former Smoker -- 0.70 packs/day for 15 years    Quit date: 02/08/2006  . Smokeless tobacco: Never Used  . Alcohol Use: Yes     Comment: rare  . Drug Use: No  . Sexual Activity: Not on file   Other Topics Concern  . Not on file   Social History Narrative    Past Surgical History  Procedure Laterality Date  . Appendectomy      Family History  Problem Relation Age of Onset  . Diabetes Father   . Heart failure Father   . Cancer Mother     lymphoma  . Diabetes Mother   . Colon cancer Neg Hx   . Esophageal cancer Neg Hx   . Rectal cancer Neg Hx   . Stomach cancer Neg Hx     No Known Allergies  Current Outpatient Prescriptions on File Prior to Visit  Medication Sig Dispense Refill  . aspirin EC 81 MG tablet Take 81 mg by mouth daily.    . carvedilol (COREG) 12.5 MG tablet TAKE ONE & ONE-HALF TABLETS BY MOUTH TWICE DAILY WITH A MEAL. (Patient taking differently: takes 1 tab in the morning) 90 tablet 6  . carvedilol (COREG) 25 MG tablet Take 1/2 tab in AM and 1 tab in PM (Patient taking differently: Takes  1 tab in PM) 45 tablet 3  . FARXIGA 10 MG TABS TAKE ONE TABLET BY MOUTH ONCE DAILY 90 tablet 0  . FREESTYLE LITE test  strip USE ONE STRIP TO CHECK GLUCOSE TWICE DAILY. 100 each 2  . furosemide (LASIX) 20 MG tablet Take 1 tablet (20 mg total) by mouth as needed. 30 tablet 3  . gabapentin (NEURONTIN) 100 MG capsule TAKE ONE CAPSULE BY MOUTH THREE TIMES DAILY 90 capsule 0  . glipiZIDE (GLUCOTROL) 10 MG tablet TAKE ONE TABLET BY MOUTH TWICE DAILY BEFORE A MEAL 180 tablet 0  . Lancets (FREESTYLE) lancets Use as instructed 100 each 12  . losartan (COZAAR) 100 MG tablet TAKE ONE TABLET BY MOUTH ONCE DAILY 30 tablet 3  . Multiple Vitamin (MULTIVITAMIN WITH MINERALS) TABS Take 1 tablet by mouth daily.    Marland Kitchen spironolactone (ALDACTONE) 25 MG tablet TAKE ONE-HALF TABLET BY MOUTH ONCE DAILY. 15 tablet 3  . zolpidem  (AMBIEN) 10 MG tablet TAKE ONE TABLET BY MOUTH AT BEDTIME AS NEEDED FOR SLEEP. 15 tablet 0  . nitroGLYCERIN (NITROSTAT) 0.4 MG SL tablet Place 1 tablet (0.4 mg total) under the tongue every 5 (five) minutes as needed for chest pain (CP or SOB). 20 tablet 0   No current facility-administered medications on file prior to visit.    BP 116/82 mmHg  Pulse 89  Temp(Src) 98.3 F (36.8 C) (Oral)  Ht 5\' 11"  (1.803 m)  Wt 205 lb 3.2 oz (93.078 kg)  BMI 28.63 kg/m2chart    Objective:   Physical Exam  Constitutional: He is oriented to person, place, and time. He appears well-developed and well-nourished.  HENT:  Head: Normocephalic and atraumatic.  Right Ear: External ear normal.  Left Ear: External ear normal.  Nose: Nose normal.  Mouth/Throat: Oropharynx is clear and moist.  Eyes: Conjunctivae and EOM are normal. Pupils are equal, round, and reactive to light.  Neck: Normal range of motion. Neck supple. No thyromegaly present.  Cardiovascular: Normal rate, regular rhythm and normal heart sounds.   Pulmonary/Chest: Effort normal and breath sounds normal.  Abdominal: Soft. Bowel sounds are normal.  Genitourinary: Rectum normal, prostate normal and penis normal. Guaiac negative stool. No penile tenderness.  Musculoskeletal: Normal range of motion.  Neurological: He is alert and oriented to person, place, and time.  Skin: Skin is warm and dry.  Right ingrown toenail: No drainage or discharge. No signs of active infection. Minimally tender to palpation.  Dry, flaky rash noted under the right eyelid. Erythematous.   Psychiatric: He has a normal mood and affect.          Assessment & Plan:  Dustin Lozano was seen today for annual exam.  Diagnoses and associated orders for this visit:  Preventative health care - Hemoglobin A1c - CBC with Differential - PSA - POC Urinalysis Dipstick - Microalbumin/Creatinine Ratio, Urine - Lipid Panel - TSH - Basic Metabolic Panel - Hepatic Function  Panel  Type 2 diabetes mellitus, uncontrolled - Hemoglobin A1c - CBC with Differential - PSA - POC Urinalysis Dipstick - Microalbumin/Creatinine Ratio, Urine - Lipid Panel - TSH - Basic Metabolic Panel - Hepatic Function Panel  Hyperlipidemia - Hemoglobin A1c - CBC with Differential - PSA - POC Urinalysis Dipstick - Microalbumin/Creatinine Ratio, Urine - Lipid Panel - TSH - Basic Metabolic Panel - Hepatic Function Panel  Essential hypertension - Hemoglobin A1c - CBC with Differential - PSA - POC Urinalysis Dipstick - Microalbumin/Creatinine Ratio, Urine - Lipid Panel - TSH - Basic Metabolic Panel - Hepatic Function Panel  Ingrown toenail without infection - Ambulatory referral to Podiatry  Other Orders - clotrimazole-betamethasone (LOTRISONE) cream; Apply 1 application topically 2 (two) times  daily.    call the office with any questions or concerns. Recheck in 3-4 months and sooner as needed. We'll discuss medication adjustments pending labs.

## 2014-01-09 NOTE — Progress Notes (Signed)
Pre visit review using our clinic review tool, if applicable. No additional management support is needed unless otherwise documented below in the visit note. 

## 2014-01-10 LAB — MICROALBUMIN / CREATININE URINE RATIO
Creatinine,U: 80.4 mg/dL
Microalb Creat Ratio: 0.6 mg/g (ref 0.0–30.0)
Microalb, Ur: 0.5 mg/dL (ref 0.0–1.9)

## 2014-01-14 ENCOUNTER — Other Ambulatory Visit: Payer: Self-pay | Admitting: Family

## 2014-01-14 ENCOUNTER — Encounter: Payer: Self-pay | Admitting: Family

## 2014-01-14 DIAGNOSIS — IMO0002 Reserved for concepts with insufficient information to code with codable children: Secondary | ICD-10-CM

## 2014-01-14 DIAGNOSIS — E1165 Type 2 diabetes mellitus with hyperglycemia: Secondary | ICD-10-CM

## 2014-01-14 MED ORDER — OMEGA-3-ACID ETHYL ESTERS 1 G PO CAPS: 2.0000 g | ORAL_CAPSULE | Freq: Two times a day (BID) | ORAL | Status: DC

## 2014-01-17 ENCOUNTER — Encounter: Payer: Self-pay | Admitting: Endocrinology

## 2014-01-17 ENCOUNTER — Ambulatory Visit: Payer: BC Managed Care – PPO | Admitting: Endocrinology

## 2014-01-17 ENCOUNTER — Ambulatory Visit (INDEPENDENT_AMBULATORY_CARE_PROVIDER_SITE_OTHER): Payer: BC Managed Care – PPO | Admitting: Endocrinology

## 2014-01-17 VITALS — BP 132/70 | HR 88 | Temp 98.3°F | Ht 71.0 in | Wt 207.0 lb

## 2014-01-17 DIAGNOSIS — E119 Type 2 diabetes mellitus without complications: Secondary | ICD-10-CM

## 2014-01-17 MED ORDER — LINAGLIPTIN 5 MG PO TABS: 5.0000 mg | ORAL_TABLET | Freq: Every day | ORAL | Status: DC

## 2014-01-17 MED ORDER — METFORMIN HCL ER 500 MG PO TB24: 500.0000 mg | ORAL_TABLET | Freq: Every day | ORAL | Status: DC

## 2014-01-17 NOTE — Progress Notes (Signed)
Subjective:    Patient ID: Dustin Lozano, male    DOB: 12-26-1960, 53 y.o.   MRN: [id number]  HPI pt states DM was dx'ed in 2005; he has moderate neuropathy of the lower extremities; he is unaware of any associated chronic complications; he has never been on insulin; pt says his diet is not good, but exercise is good; he has never had pancreatitis, severe hypoglycemia or DKA.  Past Medical History  Diagnosis Date  . HTN (hypertension)   . Diabetes mellitus   . CHF (congestive heart failure)     Systolic - LVEF 95-09%  (04/2669)  . Coronary artery disease     Nonobstructive disease per cath 08/2011    Past Surgical History  Procedure Laterality Date  . Appendectomy      History   Social History  . Marital Status: Single    Spouse Name: N/A    Number of Children: N/A  . Years of Education: N/A   Occupational History  . salesman    Social History Main Topics  . Smoking status: Former Smoker -- 0.70 packs/day for 15 years    Quit date: 02/08/2006  . Smokeless tobacco: Never Used  . Alcohol Use: Yes     Comment: rare  . Drug Use: No  . Sexual Activity: Not on file   Other Topics Concern  . Not on file   Social History Narrative    Current Outpatient Prescriptions on File Prior to Visit  Medication Sig Dispense Refill  . AFLURIA PRESERVATIVE FREE 0.5 ML SUSY   0  . aspirin EC 81 MG tablet Take 81 mg by mouth daily.    . carvedilol (COREG) 12.5 MG tablet TAKE ONE & ONE-HALF TABLETS BY MOUTH TWICE DAILY WITH A MEAL. (Patient taking differently: takes 1 tab in the morning) 90 tablet 6  . carvedilol (COREG) 25 MG tablet Take 1/2 tab in AM and 1 tab in PM (Patient taking differently: Takes  1 tab in PM) 45 tablet 3  . clotrimazole-betamethasone (LOTRISONE) cream Apply 1 application topically 2 (two) times daily. 30 g 0  . FARXIGA 10 MG TABS TAKE ONE TABLET BY MOUTH ONCE DAILY 90 tablet 0  . FREESTYLE LITE test strip USE ONE STRIP TO CHECK GLUCOSE TWICE DAILY. 100 each 2    . furosemide (LASIX) 20 MG tablet Take 1 tablet (20 mg total) by mouth as needed. 30 tablet 3  . gabapentin (NEURONTIN) 100 MG capsule TAKE ONE CAPSULE BY MOUTH THREE TIMES DAILY 90 capsule 0  . glipiZIDE (GLUCOTROL) 10 MG tablet TAKE ONE TABLET BY MOUTH TWICE DAILY BEFORE A MEAL 180 tablet 0  . Lancets (FREESTYLE) lancets Use as instructed 100 each 12  . losartan (COZAAR) 100 MG tablet TAKE ONE TABLET BY MOUTH ONCE DAILY 30 tablet 3  . Multiple Vitamin (MULTIVITAMIN WITH MINERALS) TABS Take 1 tablet by mouth daily.    Marland Kitchen omega-3 acid ethyl esters (LOVAZA) 1 G capsule Take 2 capsules (2 g total) by mouth 2 (two) times daily. 120 capsule 3  . spironolactone (ALDACTONE) 25 MG tablet TAKE ONE-HALF TABLET BY MOUTH ONCE DAILY. 15 tablet 3  . zolpidem (AMBIEN) 10 MG tablet TAKE ONE TABLET BY MOUTH AT BEDTIME AS NEEDED FOR SLEEP. 15 tablet 0  . nitroGLYCERIN (NITROSTAT) 0.4 MG SL tablet Place 1 tablet (0.4 mg total) under the tongue every 5 (five) minutes as needed for chest pain (CP or SOB). 20 tablet 0   No current facility-administered medications on  file prior to visit.    No Known Allergies  Family History  Problem Relation Age of Onset  . Diabetes Father   . Heart failure Father   . Cancer Mother     lymphoma  . Diabetes Mother   . Colon cancer Neg Hx   . Esophageal cancer Neg Hx   . Rectal cancer Neg Hx   . Stomach cancer Neg Hx     BP 132/70 mmHg  Pulse 88  Temp(Src) 98.3 F (36.8 C) (Oral)  Ht 5\' 11"  (1.803 m)  Wt 207 lb (93.895 kg)  BMI 28.88 kg/m2  SpO2 94%    Review of Systems denies blurry vision, headache, chest pain, sob, n/v, urinary frequency, muscle cramps, excessive diaphoresis, depression, cold intolerance, rhinorrhea, and easy bruising.  He has lost 75 lbs x 2 years.      Objective:   Physical Exam VS: see vs page GEN: no distress HEAD: head: no deformity eyes: no periorbital swelling, no proptosis external nose and ears are normal mouth: no lesion  seen NECK: supple, thyroid is not enlarged CHEST WALL: no deformity LUNGS: clear to auscultation BREASTS:  No gynecomastia CV: reg rate and rhythm, no murmur ABD: abdomen is soft, nontender.  no hepatosplenomegaly.  not distended.  no hernia MUSCULOSKELETAL: muscle bulk and strength are grossly normal.  no obvious joint swelling.  gait is normal and steady EXTEMITIES: no deformity.  no ulcer on the feet.  feet are of normal color and temp.  no edema.  The right great toenail is ingrown.   PULSES: dorsalis pedis intact bilat.  no carotid bruit NEURO:  cn 2-12 grossly intact.   readily moves all 4's.  sensation is intact to touch on the feet, but slightly decreased from normal.   SKIN:  Normal texture and temperature.  No rash or suspicious lesion is visible.   NODES:  None palpable at the neck PSYCH: alert, well-oriented.  Does not appear anxious nor depressed.  Lab Results  Component Value Date   HGBA1C 11.0* 01/09/2014   i reviewed electrocardiogram: 08/14/11  i have reviewed the following old records: Office note from 01/09/14: pt was noted to have elevated a1c, and ref here.     Assessment & Plan:  DM: new to me.  He needs increased rx.   ingrown nail.  i agree with plan to see podiatry.     Patient is advised the following: Patient Instructions  good diet and exercise habits significanly improve the control of your diabetes.  please let me know if you wish to be referred to a dietician.  high blood sugar is very risky to your health.  you should see an eye doctor and dentist every year.  It is very important to get all recommended vaccinations.  controlling your blood pressure and cholesterol drastically reduces the damage diabetes does to your body.  Those who smoke should quit.  please discuss these with your doctor.  check your blood sugar twice a day.  vary the time of day when you check, between before the 3 meals, and at bedtime.  also check if you have symptoms of your blood  sugar being too high or too low.  please keep a record of the readings and bring it to your next appointment here.  You can write it on any piece of paper.  please call us sooner if your blood sugar goes below 70, or if you have a lot of readings over 200. Please see the foot  doctor tomorrow.   i have sent 2 prescription to your pharmacy, to add on for your diabetes.   Please come back for a follow-up appointment in 1 month.  Please see Vaughan Basta the same day, to hear about insulin.

## 2014-01-17 NOTE — Patient Instructions (Addendum)
good diet and exercise habits significanly improve the control of your diabetes.  please let me know if you wish to be referred to a dietician.  high blood sugar is very risky to your health.  you should see an eye doctor and dentist every year.  It is very important to get all recommended vaccinations.  controlling your blood pressure and cholesterol drastically reduces the damage diabetes does to your body.  Those who smoke should quit.  please discuss these with your doctor.  check your blood sugar twice a day.  vary the time of day when you check, between before the 3 meals, and at bedtime.  also check if you have symptoms of your blood sugar being too high or too low.  please keep a record of the readings and bring it to your next appointment here.  You can write it on any piece of paper.  please call us sooner if your blood sugar goes below 70, or if you have a lot of readings over 200. Please see the foot doctor tomorrow.   i have sent 2 prescription to your pharmacy, to add on for your diabetes.   Please come back for a follow-up appointment in 1 month.  Please see Vaughan Basta the same day, to hear about insulin.

## 2014-01-18 ENCOUNTER — Ambulatory Visit (INDEPENDENT_AMBULATORY_CARE_PROVIDER_SITE_OTHER): Payer: BC Managed Care – PPO | Admitting: Podiatry

## 2014-01-18 ENCOUNTER — Encounter: Payer: Self-pay | Admitting: Podiatry

## 2014-01-18 VITALS — BP 139/90 | HR 87 | Resp 12

## 2014-01-18 DIAGNOSIS — T148XXA Other injury of unspecified body region, initial encounter: Secondary | ICD-10-CM

## 2014-01-18 DIAGNOSIS — IMO0002 Reserved for concepts with insufficient information to code with codable children: Secondary | ICD-10-CM

## 2014-01-18 DIAGNOSIS — T148 Other injury of unspecified body region: Secondary | ICD-10-CM

## 2014-01-18 DIAGNOSIS — L03019 Cellulitis of unspecified finger: Secondary | ICD-10-CM

## 2014-01-18 MED ORDER — CEPHALEXIN 500 MG PO CAPS: 500.0000 mg | ORAL_CAPSULE | Freq: Three times a day (TID) | ORAL | Status: DC

## 2014-01-18 NOTE — Progress Notes (Signed)
Subjective:    Patient ID: Dustin Lozano, male    DOB: 17-Jul-1960, 53 y.o.   MRN: [id number]  HPI 53 year old male presents the office today with complaints of bilateral big toe infections. He states that he has had some ingrown toenail and thick skin on the side of his toes for which she has been picking off. He states that he was picking the area and tea, both of the toes. Over the last couple days he has noticed increased swelling and redness as well as some drainage around the nail sites with left greater than right. He denies any specific pain to the area. Patient is also trim his toenails and they have been bleeding. He's been applying Neosporin and Vaseline to the areas about any resolution. No other complaints at this time. Last HbA1c was 11  Review of Systems  All other systems reviewed and are negative.      Objective:   Physical Exam DP/PT pulses palpable bilaterally, CRT less than 3 seconds Protective sensation decreased with Simms Weinstein monofilament, decreased vibratory sensation. On the lateral aspect left hallux and the medial aspect of the right hallux there is evidence of incurvation along the nail borders. There is erythema around the hallux overlying these nail borders. There is significant amount of granulation tissue within the right medial nail border with evidence of the small amount purulence. There is a small superficial abrasion over both of these nail sites for the patient has been cutting the skin. There is no ascending cellulitis. No areas of fluctuance or crepitus. Left third digit and bilateral fifth digit nails are cut short with evidence of fresh blood around the nails from being cut too short. There is no swelling erythema or drainage or any ascending cellulitis. There is a pre-ulcerative lesion on the medial aspect of the right fifth digit from pressure with underlying adductovarus deformity. No pain with calf compression, swelling, warmth, erythema.  No  open lesions or pre-ulcerative lesions.     Assessment & Plan:  54 year old male with paronychia lateral hallux with superficial abrasions on both hallux; evidence of bleeding nails iatrogenic. -Treatment options were discussed including alternatives, risks, complications. -At this time discussed with the patient partial nail avulsions of the ingrown toenails due to paronychia. I discussed with the patient he is at risk of nonhealing wound which could lead to amputation if we performed the nail avulsions or if we did not perform them infection could worsen and he could still Leetch redictation. Given the patient's sugar is elevated he is at increased risk. Patient understands these risks and elects to proceed with partial nail avulsions. Under sterile conditions a total of 2.5 mL of a one-to-one mixture of 2% lidocaine plain and 0.5% Marcaine plain was infiltrated in a hallux block fashion bilaterally. The skin was then prepped in sterile fashion. Next the lateral border of the left hallux nail in the medial border of the right hallux nail was sharply excised sure to remove the entire offending nail border. There was found to be a significant amount ingrowing in both of the nail borders. There is a small amount of purulence identified from both nail borders. The skin was then debrided. Once the area was debrided and the nail was removed there is no further purulence identified in the skin underlying was intact. The areas and closely irrigated. Silvadene was applied followed by dry sterile dressing. No tourniquet was used for the procedures. At the conclusion of the procedure there was found to  be an immediate capillary refill time to the digits. Patient tolerated the procedure well without any, complications. Post procedure instructions were discussed the patient which she verbally understood. Prescribed Keflex. Continues monitoring clinical signs or symptoms of worsening infection and directed to call the  office immediately should any occur or go to the emergency room. -Discussed the patient not to trim his toenails himself. Apply interbody ointment and a Band-Aid over the nail sites which were bleeding. Also discussed the importance of daily foot inspection. -Follow-up in 1 week or sooner if any problems are to arise. In the meantime, call the office in the questions, concerns, change in symptoms.

## 2014-01-18 NOTE — Patient Instructions (Signed)
Betadine Soak Instructions  Purchase an 8 oz. bottle of BETADINE solution (Povidone)  THE DAY AFTER THE PROCEDURE  Place 1 tablespoon of betadine solution in a quart of warm tap water.  Submerge your foot or feet with outer bandage intact for the initial soak; this will allow the bandage to become moist and wet for easy lift off.  Once you remove your bandage, continue to soak in the solution for 20 minutes.  This soak should be done twice a day.  Next, remove your foot or feet from solution, blot dry the affected area and cover.  You may use a band aid large enough to cover the area or use gauze and tape.  Apply other medications to the area as directed by the doctor such as cortisporin otic solution (ear drops) or neosporin.  IF YOUR SKIN BECOMES IRRITATED WHILE USING THESE INSTRUCTIONS, IT IS OKAY TO SWITCH TO EPSOM SALTS AND WATER OR WHITE VINEGAR AND WATER.  Monitor for any signs/symptoms of infection. Call the office immediately if any occur or go directly to the emergency room. Call with any questions/concerns.  Diabetes and Foot Care Diabetes may cause you to have problems because of poor blood supply (circulation) to your feet and legs. This may cause the skin on your feet to become thinner, break easier, and heal more slowly. Your skin may become dry, and the skin may peel and crack. You may also have nerve damage in your legs and feet causing decreased feeling in them. You may not notice minor injuries to your feet that could lead to infections or more serious problems. Taking care of your feet is one of the most important things you can do for yourself.  HOME CARE INSTRUCTIONS  Wear shoes at all times, even in the house. Do not go barefoot. Bare feet are easily injured.  Check your feet daily for blisters, cuts, and redness. If you cannot see the bottom of your feet, use a mirror or ask someone for help.  Wash your feet with warm water (do not use hot water) and mild soap. Then pat  your feet and the areas between your toes until they are completely dry. Do not soak your feet as this can dry your skin.  Apply a moisturizing lotion or petroleum jelly (that does not contain alcohol and is unscented) to the skin on your feet and to dry, brittle toenails. Do not apply lotion between your toes.  Trim your toenails straight across. Do not dig under them or around the cuticle. File the edges of your nails with an emery board or nail file.  Do not cut corns or calluses or try to remove them with medicine.  Wear clean socks or stockings every day. Make sure they are not too tight. Do not wear knee-high stockings since they may decrease blood flow to your legs.  Wear shoes that fit properly and have enough cushioning. To break in new shoes, wear them for just a few hours a day. This prevents you from injuring your feet. Always look in your shoes before you put them on to be sure there are no objects inside.  Do not cross your legs. This may decrease the blood flow to your feet.  If you find a minor scrape, cut, or break in the skin on your feet, keep it and the skin around it clean and dry. These areas may be cleansed with mild soap and water. Do not cleanse the area with peroxide, alcohol, or iodine.  When you remove an adhesive bandage, be sure not to damage the skin around it.  If you have a wound, look at it several times a day to make sure it is healing.  Do not use heating pads or hot water bottles. They may burn your skin. If you have lost feeling in your feet or legs, you may not know it is happening until it is too late.  Make sure your health care provider performs a complete foot exam at least annually or more often if you have foot problems. Report any cuts, sores, or bruises to your health care provider immediately. SEEK MEDICAL CARE IF:   You have an injury that is not healing.  You have cuts or breaks in the skin.  You have an ingrown nail.  You notice  redness on your legs or feet.  You feel burning or tingling in your legs or feet.  You have pain or cramps in your legs and feet.  Your legs or feet are numb.  Your feet always feel cold. SEEK IMMEDIATE MEDICAL CARE IF:   There is increasing redness, swelling, or pain in or around a wound.  There is a red line that goes up your leg.  Pus is coming from a wound.  You develop a fever or as directed by your health care provider.  You notice a bad smell coming from an ulcer or wound. Document Released: 01/23/2000 Document Revised: 09/27/2012 Document Reviewed: 07/04/2012 [facility] Patient Information 2015 [place], [place]. This information is not intended to replace advice given to you by your health care provider. Make sure you discuss any questions you have with your health care provider.

## 2014-01-20 ENCOUNTER — Other Ambulatory Visit: Payer: Self-pay | Admitting: Family

## 2014-01-22 ENCOUNTER — Ambulatory Visit: Payer: BC Managed Care – PPO | Admitting: Endocrinology

## 2014-01-25 ENCOUNTER — Ambulatory Visit (INDEPENDENT_AMBULATORY_CARE_PROVIDER_SITE_OTHER): Payer: BC Managed Care – PPO | Admitting: Podiatry

## 2014-01-25 DIAGNOSIS — L03019 Cellulitis of unspecified finger: Secondary | ICD-10-CM

## 2014-01-25 DIAGNOSIS — L97521 Non-pressure chronic ulcer of other part of left foot limited to breakdown of skin: Secondary | ICD-10-CM

## 2014-01-25 DIAGNOSIS — IMO0002 Reserved for concepts with insufficient information to code with codable children: Secondary | ICD-10-CM

## 2014-01-25 NOTE — Patient Instructions (Signed)
Continue soaking in epsom salts twice a day followed by antibiotic ointment and a band-aid. Continue this until completely healed. Can leave uncovered at night.  Monitor for any signs/symptoms of infection. Call the office immediately if any occur or go directly to the emergency room. Call with any questions/concerns.

## 2014-01-25 NOTE — Progress Notes (Signed)
Patient ID: Dustin Lozano, male   DOB: 1960-03-30, 53 y.o.   MRN: [id number]  Left 2nd digit superficial wound  Subjective: 53 year old male returns the office for 1 week follow-up evaluation status post bilateral lateral hallux partial nail avulsion secondary to paronychia. The patient states that he's been soaking the foot twice a day Epson salts followed by antibiotic ointment and a Band-Aid. He states that when he soaked his foot he had some skin falloff of his left second toe any had a blister. He's been continuing the antibiotics. Denies any systemic complaints as fevers, chills, nausea, vomiting. No other complaints at this time in no acute changes since last appointment.  Objective: AAO 3, NAD Neurovascular status unchanged. Bilateral lateral nail borders of the hallux status post partial nail avulsion which appeared to be healing well at this time. There is a small amount of erythema on the nail borders however it is improved compared to prior appointment. There is no tenderness to palpation overlying the nail borders. There is no drainage or purulence expressed. No ascending cellulitis. No areas of fluctuance or crepitus. On the plantar aspect of the left second digit there is a superficial wound extending from approximately level of the DIPJ to just proximal to the PIPJ. The wound is superficial and granular. There is no surrounding erythema. No drainage or purulence is expressed. No ascending cellulitis.  No other open lesions. No pain with calf compression, swelling, warmth, erythema.  Assessment: 53 year old male status post bilateral lateral border of the hallux partial nail avulsions due to paronychia; left second digit superficial wound currently with no signs of infection  Plan: -Treatment options were discussed with the patient including alternatives, risks, complications. -At this time recommend continue soaking in Epson salts twice a day followed by antibiotic ointment and a  Band-Aid. Can leave the area uncovered at night if there is not a drainage. Also after soaking to dry in between his toes thoroughly. On the left second digit also apply antibiotic ointment followed by a band-aid. Continue to monitor these areas for any clinical signs or symptoms of infection and directed to call the office immediately should any are to occur or go directly to the emergency room. -Follow-up in 2 weeks or sooner should any palms arise. In the meantime, call the office with any questions, concerns, change in symptoms.

## 2014-02-11 ENCOUNTER — Encounter: Payer: Self-pay | Admitting: Podiatry

## 2014-02-13 ENCOUNTER — Ambulatory Visit: Payer: BC Managed Care – PPO | Admitting: Podiatry

## 2014-02-18 ENCOUNTER — Encounter: Payer: Self-pay | Admitting: Family

## 2014-02-21 ENCOUNTER — Telehealth: Payer: Self-pay | Admitting: Endocrinology

## 2014-02-21 ENCOUNTER — Other Ambulatory Visit: Payer: Self-pay | Admitting: Endocrinology

## 2014-02-21 MED ORDER — SITAGLIPTIN PHOSPHATE 100 MG PO TABS: 100.0000 mg | ORAL_TABLET | Freq: Every day | ORAL | Status: DC

## 2014-02-21 NOTE — Telephone Encounter (Signed)
please call patient: Per insurance, we have to change tradjenta to [place]. i have sent a prescription to your pharmacy

## 2014-02-21 NOTE — Telephone Encounter (Signed)
Pt advised of note below and voiced understanding.  

## 2014-02-24 ENCOUNTER — Other Ambulatory Visit: Payer: Self-pay | Admitting: Family

## 2014-02-27 ENCOUNTER — Telehealth: Payer: Self-pay | Admitting: Endocrinology

## 2014-02-27 MED ORDER — GEMFIBROZIL 600 MG PO TABS
600.0000 mg | ORAL_TABLET | Freq: Two times a day (BID) | ORAL | Status: DC
Start: 2014-02-27 — End: 2014-06-22

## 2014-02-27 NOTE — Telephone Encounter (Signed)
Pt needs RX sent in to substitute the meds Jinny Blossom is aware of PA

## 2014-03-05 ENCOUNTER — Ambulatory Visit (INDEPENDENT_AMBULATORY_CARE_PROVIDER_SITE_OTHER): Payer: BLUE CROSS/BLUE SHIELD | Admitting: Endocrinology

## 2014-03-05 ENCOUNTER — Encounter: Payer: BC Managed Care – PPO | Attending: Endocrinology | Admitting: Nutrition

## 2014-03-05 ENCOUNTER — Encounter: Payer: Self-pay | Admitting: Endocrinology

## 2014-03-05 VITALS — BP 118/72 | HR 80 | Temp 97.9°F | Ht 71.0 in | Wt 209.0 lb

## 2014-03-05 DIAGNOSIS — Z713 Dietary counseling and surveillance: Secondary | ICD-10-CM | POA: Diagnosis not present

## 2014-03-05 DIAGNOSIS — E119 Type 2 diabetes mellitus without complications: Secondary | ICD-10-CM

## 2014-03-05 MED ORDER — BROMOCRIPTINE MESYLATE 2.5 MG PO TABS: 1.2500 mg | ORAL_TABLET | Freq: Every day | ORAL | Status: DC

## 2014-03-05 MED ORDER — METFORMIN HCL ER 500 MG PO TB24
500.0000 mg | ORAL_TABLET | Freq: Every day | ORAL | Status: DC
Start: 2014-03-05 — End: 2015-01-14

## 2014-03-05 MED ORDER — EMPAGLIFLOZIN-LINAGLIPTIN 25-5 MG PO TABS: 1.0000 | ORAL_TABLET | Freq: Every day | ORAL | Status: DC

## 2014-03-05 NOTE — Patient Instructions (Signed)
Test blood sugars twice a day--before breakfast and supper one day, and before lunch and bedtime the next day. Call blood sugar readings to me in one week.  Walk  [place] for 30-40 min. 4-5 days/wk Limit the amount of fat in the diet (butter, oil, fried foods, or high fat foods, sour cream, cream cheese, salad dressings.

## 2014-03-05 NOTE — Progress Notes (Signed)
Patient says that he is here to review his diet.  He did not bring meter with him  Typical day: 5:45AM  Gets up and tests blood sugars.  He says that his blood sugars have been in the 190-low 200s for the last 3-4 months.  Before then, it was 150-160. He eats no breakfast.  Will now drink water in the morning.  Has been drinking regular Pepsi 12ounces 2/day.  Lunch--largest meal is sometimes eaten out--not fast food, but yesterday it was [place] with fried rice and beef and veg.  He eats a few chips before the meal.   He will usually eat a sandwich with a small bag of chips, or something from supper the night before.  He will drink a pepsi at this meal-12 ounces.  6-6:30PM: has been eating a supper meal for the last 4 months.  Before that, he only ate one meal/day.  Denies snacking between meals or at HS.  This meal is 4-6 ounces of protein--chicken or beef, or pork--usually baked, with pasta, or 2-3 servings of starchy veg.  He will drink water now at this meal.  He lost 100 pounds 1 1/2 yr. Ago eating only one meal and drinking water all day.  Says he wants to do this again.    Discussed what happens in his body when he eats only one meal/day, and strongly recommended that he eat small bfast of 1 piece of toast with cheese or peanut butter, to start his metabolism, get protein to prevent muscle loss, and to keep from getting hungry.  He agreed to do this.  Also suggested he eat a supper meal of 3-4 ounces of protein, and watch the fat in his meal, in the forms of butter, oil, fried foods, and high fat meats/and carbs.    Discussed insulin resistance when blood sugars go high, and the need for balancing meals, with limited amount of carbs, protein and fat, and the need for continued exercise of 30-45 min. 4-5 days/wk., with weight loss to help his remaining insulin to work better. He reported good understanding of this, and agreed to test his blood sugars twice daily--before breakfast and supper on  day, and before lunch and bedtime another day.  He will call in one week with blood sugar readings.

## 2014-03-05 NOTE — Progress Notes (Signed)
Subjective:    Patient ID: Dustin Lozano, male    DOB: October 23, 1960, 54 y.o.   MRN: [id number]  HPI  Pt returns for f/u of diabetes mellitus: DM type: 2 Dx'ed:  Complications: polyneuropathy Therapy: 5 oral meds DKA: never Severe hypoglycemia: never Pancreatitis: never Other: he has never been on insulin, and he declines for now.  Interval history: no cbg record, but states cbg's vary from 160-200.  He does not take meds as rx'ed.   Past Medical History  Diagnosis Date  . HTN (hypertension)   . Diabetes mellitus   . CHF (congestive heart failure)     Systolic - LVEF 31-54%  (0/0867)  . Coronary artery disease     Nonobstructive disease per cath 08/2011    Past Surgical History  Procedure Laterality Date  . Appendectomy    . Left and right heart catheterization with coronary angiogram N/A 08/17/2011    Procedure: LEFT AND RIGHT HEART CATHETERIZATION WITH CORONARY ANGIOGRAM;  Surgeon: Sherren Mocha, MD;  Location: [facility] CATH LAB;  Service: Cardiovascular;  Laterality: N/A;    History   Social History  . Marital Status: Single    Spouse Name: N/A    Number of Children: N/A  . Years of Education: N/A   Occupational History  . salesman    Social History Main Topics  . Smoking status: Former Smoker -- 0.70 packs/day for 15 years    Quit date: 02/08/2006  . Smokeless tobacco: Never Used  . Alcohol Use: Yes     Comment: rare  . Drug Use: No  . Sexual Activity: Not on file   Other Topics Concern  . Not on file   Social History Narrative    Current Outpatient Prescriptions on File Prior to Visit  Medication Sig Dispense Refill  . AFLURIA PRESERVATIVE FREE 0.5 ML SUSY   0  . aspirin EC 81 MG tablet Take 81 mg by mouth daily.    . carvedilol (COREG) 12.5 MG tablet TAKE ONE & ONE-HALF TABLETS BY MOUTH TWICE DAILY WITH A MEAL. (Patient taking differently: takes 1 tab in the morning) 90 tablet 6  . carvedilol (COREG) 25 MG tablet Take 1/2 tab in AM and 1 tab in PM (Patient  taking differently: Takes  1 tab in PM) 45 tablet 3  . clotrimazole-betamethasone (LOTRISONE) cream Apply 1 application topically 2 (two) times daily. 30 g 0  . FREESTYLE LITE test strip USE ONE STRIP TO CHECK GLUCOSE TWICE DAILY. 100 each 2  . furosemide (LASIX) 20 MG tablet Take 1 tablet (20 mg total) by mouth as needed. 30 tablet 3  . gabapentin (NEURONTIN) 100 MG capsule TAKE ONE CAPSULE BY MOUTH THREE TIMES DAILY. 270 capsule 0  . gemfibrozil (LOPID) 600 MG tablet Take 1 tablet (600 mg total) by mouth 2 (two) times daily before a meal. 60 tablet 3  . glipiZIDE (GLUCOTROL) 10 MG tablet TAKE ONE TABLET BY MOUTH TWICE DAILY BEFORE A  MEAL. 180 tablet 0  . Lancets (FREESTYLE) lancets Use as instructed 100 each 12  . losartan (COZAAR) 100 MG tablet TAKE ONE TABLET BY MOUTH ONCE DAILY 30 tablet 3  . Multiple Vitamin (MULTIVITAMIN WITH MINERALS) TABS Take 1 tablet by mouth daily.    Marland Kitchen omega-3 acid ethyl esters (LOVAZA) 1 G capsule Take 2 capsules (2 g total) by mouth 2 (two) times daily. 120 capsule 3  . spironolactone (ALDACTONE) 25 MG tablet TAKE ONE-HALF TABLET BY MOUTH ONCE DAILY. 15 tablet 3  .  zolpidem (AMBIEN) 10 MG tablet TAKE ONE TABLET BY MOUTH AT BEDTIME AS NEEDED FOR SLEEP. 15 tablet 0  . nitroGLYCERIN (NITROSTAT) 0.4 MG SL tablet Place 1 tablet (0.4 mg total) under the tongue every 5 (five) minutes as needed for chest pain (CP or SOB). 20 tablet 0  . sitaGLIPtin (JANUVIA) 100 MG tablet Take 1 tablet (100 mg total) by mouth daily. (Patient not taking: Reported on 03/05/2014) 30 tablet 11   No current facility-administered medications on file prior to visit.    No Known Allergies  Family History  Problem Relation Age of Onset  . Diabetes Father   . Heart failure Father   . Cancer Mother     lymphoma  . Diabetes Mother   . Colon cancer Neg Hx   . Esophageal cancer Neg Hx   . Rectal cancer Neg Hx   . Stomach cancer Neg Hx     BP 118/72 mmHg  Pulse 80  Temp(Src) 97.9 F (36.6  C) (Oral)  Ht 5\' 11"  (1.803 m)  Wt 209 lb (94.802 kg)  BMI 29.16 kg/m2  SpO2 97%    Review of Systems He denies hypoglycemia and polyuria.      Objective:   Physical Exam VITAL SIGNS:  See vs page GENERAL: no distress Pulses: dorsalis pedis intact bilat.   Feet: no deformity. no edema. The right great toenail is much better.  Skin: no ulcer on the feet. normal color and temp.  Neuro: sensation is intact to touch on the feet, but slightly decreased from normal.    Lab Results  Component Value Date   HGBA1C 11.0* 01/09/2014      Assessment & Plan:  DM: moderate exacerbation.   Noncompliance with cbg recording and meds: I'll work around this as best I can.  Pt says he'll take meds as rx'ed.     Patient is advised the following: Patient Instructions  check your blood sugar twice a day.  vary the time of day when you check, between before the 3 meals, and at bedtime.  also check if you have symptoms of your blood sugar being too high or too low.  please keep a record of the readings and bring it to your next appointment here.  You can write it on any piece of paper.  please call us sooner if your blood sugar goes below 70, or if you have a lot of readings over 200.  i have sent a prescription to your pharmacy, to replace both other new meds.  Please also start taking "bromocriptine," to help your blood sugar. It has possible side effects of nausea and dizziness.  These go away with time.  You can avoid these by taking it at bedtime.    Please come back for a follow-up appointment in 1 month.

## 2014-03-05 NOTE — Patient Instructions (Addendum)
check your blood sugar twice a day.  vary the time of day when you check, between before the 3 meals, and at bedtime.  also check if you have symptoms of your blood sugar being too high or too low.  please keep a record of the readings and bring it to your next appointment here.  You can write it on any piece of paper.  please call us sooner if your blood sugar goes below 70, or if you have a lot of readings over 200.  i have sent a prescription to your pharmacy, to replace both other new meds.  Please also start taking "bromocriptine," to help your blood sugar. It has possible side effects of nausea and dizziness.  These go away with time.  You can avoid these by taking it at bedtime.    Please come back for a follow-up appointment in 1 month.

## 2014-03-07 DIAGNOSIS — Z0279 Encounter for issue of other medical certificate: Secondary | ICD-10-CM

## 2014-03-11 ENCOUNTER — Encounter: Payer: Self-pay | Admitting: Endocrinology

## 2014-03-11 ENCOUNTER — Telehealth: Payer: Self-pay | Admitting: Endocrinology

## 2014-03-11 MED ORDER — SAXAGLIPTIN HCL 5 MG PO TABS: 5.0000 mg | ORAL_TABLET | Freq: Every day | ORAL | Status: DC

## 2014-03-11 MED ORDER — CANAGLIFLOZIN 300 MG PO TABS: 300.0000 mg | ORAL_TABLET | Freq: Every day | ORAL | Status: DC

## 2014-03-11 NOTE — Telephone Encounter (Signed)
Pt advised of note below. Pt wanted to verify. Can he stop the bromocriptine since the medications listed below have been sent to his pharmacy?  Please advise, Thanks!

## 2014-03-11 NOTE — Telephone Encounter (Signed)
You could try, but it is extremely unlikely that your blood sugar will be good without it.

## 2014-03-11 NOTE — Telephone Encounter (Signed)
please call patient: i received denial of glyxambi i have sent 2 prescriptions to your pharmacy: invokana and onglyza, which the letter says are preferred.

## 2014-03-12 ENCOUNTER — Other Ambulatory Visit (HOSPITAL_COMMUNITY): Payer: Self-pay | Admitting: *Deleted

## 2014-03-12 ENCOUNTER — Encounter: Payer: Self-pay | Admitting: Endocrinology

## 2014-03-12 DIAGNOSIS — I5022 Chronic systolic (congestive) heart failure: Secondary | ICD-10-CM

## 2014-03-12 MED ORDER — LOSARTAN POTASSIUM 100 MG PO TABS: 100.0000 mg | ORAL_TABLET | Freq: Every day | ORAL | Status: DC

## 2014-03-12 NOTE — Telephone Encounter (Signed)
Pt advised of note below and voiced understanding.  

## 2014-03-14 ENCOUNTER — Encounter: Payer: Self-pay | Admitting: Endocrinology

## 2014-03-21 ENCOUNTER — Other Ambulatory Visit: Payer: Self-pay | Admitting: Family

## 2014-04-16 ENCOUNTER — Encounter: Payer: Self-pay | Admitting: Family

## 2014-04-16 ENCOUNTER — Telehealth: Payer: Self-pay | Admitting: Nurse Practitioner

## 2014-04-16 DIAGNOSIS — J019 Acute sinusitis, unspecified: Secondary | ICD-10-CM

## 2014-04-16 MED ORDER — AZITHROMYCIN 250 MG PO TABS: ORAL_TABLET | ORAL | Status: DC

## 2014-04-16 NOTE — Progress Notes (Signed)

## 2014-05-14 ENCOUNTER — Other Ambulatory Visit (HOSPITAL_COMMUNITY): Payer: Self-pay | Admitting: *Deleted

## 2014-05-14 DIAGNOSIS — I5022 Chronic systolic (congestive) heart failure: Secondary | ICD-10-CM

## 2014-05-14 MED ORDER — SPIRONOLACTONE 25 MG PO TABS: ORAL_TABLET | ORAL | Status: DC

## 2014-05-18 ENCOUNTER — Other Ambulatory Visit: Payer: Self-pay | Admitting: Family

## 2014-05-25 ENCOUNTER — Other Ambulatory Visit: Payer: Self-pay | Admitting: Family

## 2014-06-08 ENCOUNTER — Other Ambulatory Visit: Payer: Self-pay | Admitting: Family

## 2014-06-15 ENCOUNTER — Other Ambulatory Visit: Payer: Self-pay | Admitting: Family

## 2014-06-15 IMAGING — CR DG CHEST 2V
2 series · 2 of 2 positions shown · non-contrast
Comparison: Chest radiograph 10/24/2003

CLINICAL DATA: Short of breath

CHEST - 2 VIEW

[w chest pa]
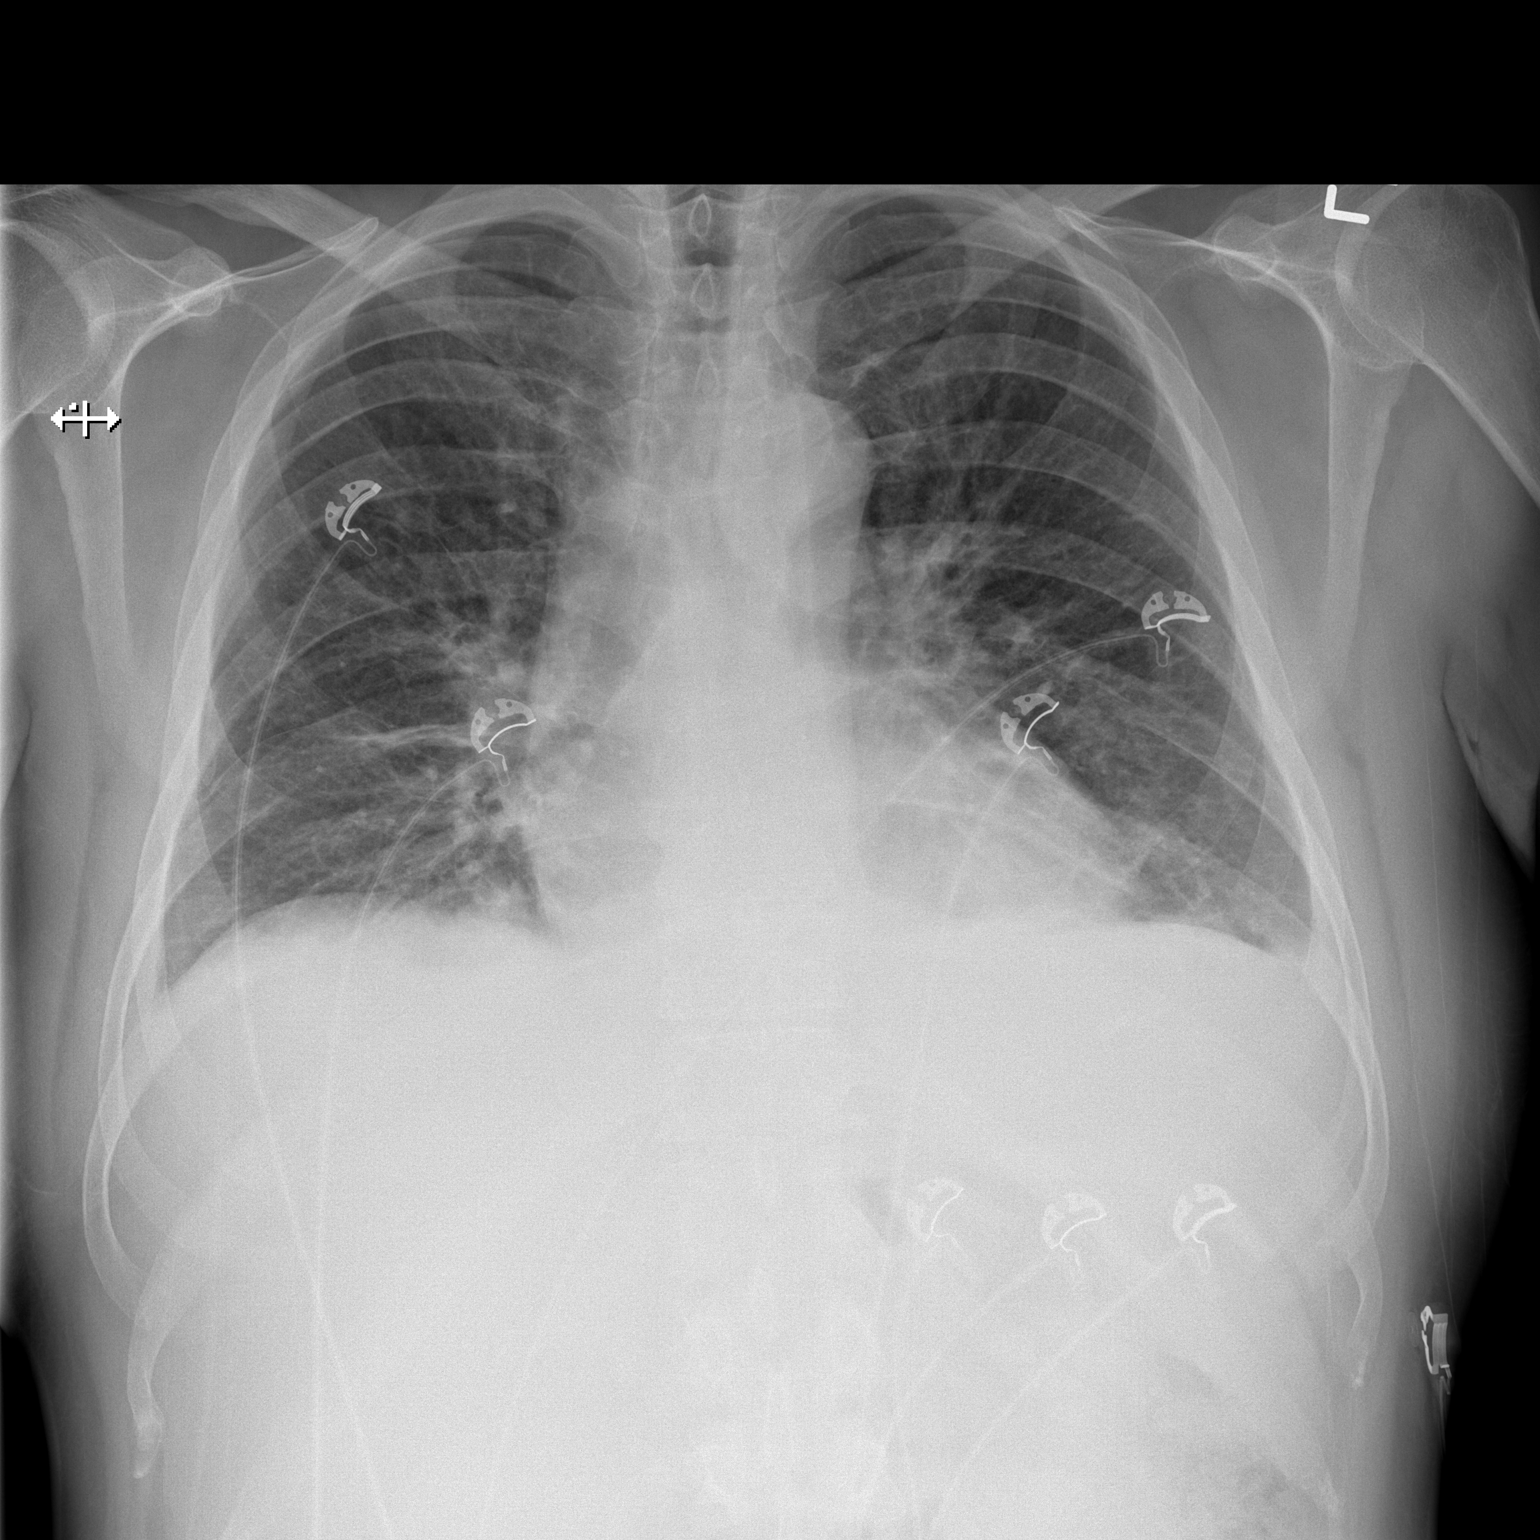

[w chest lat]
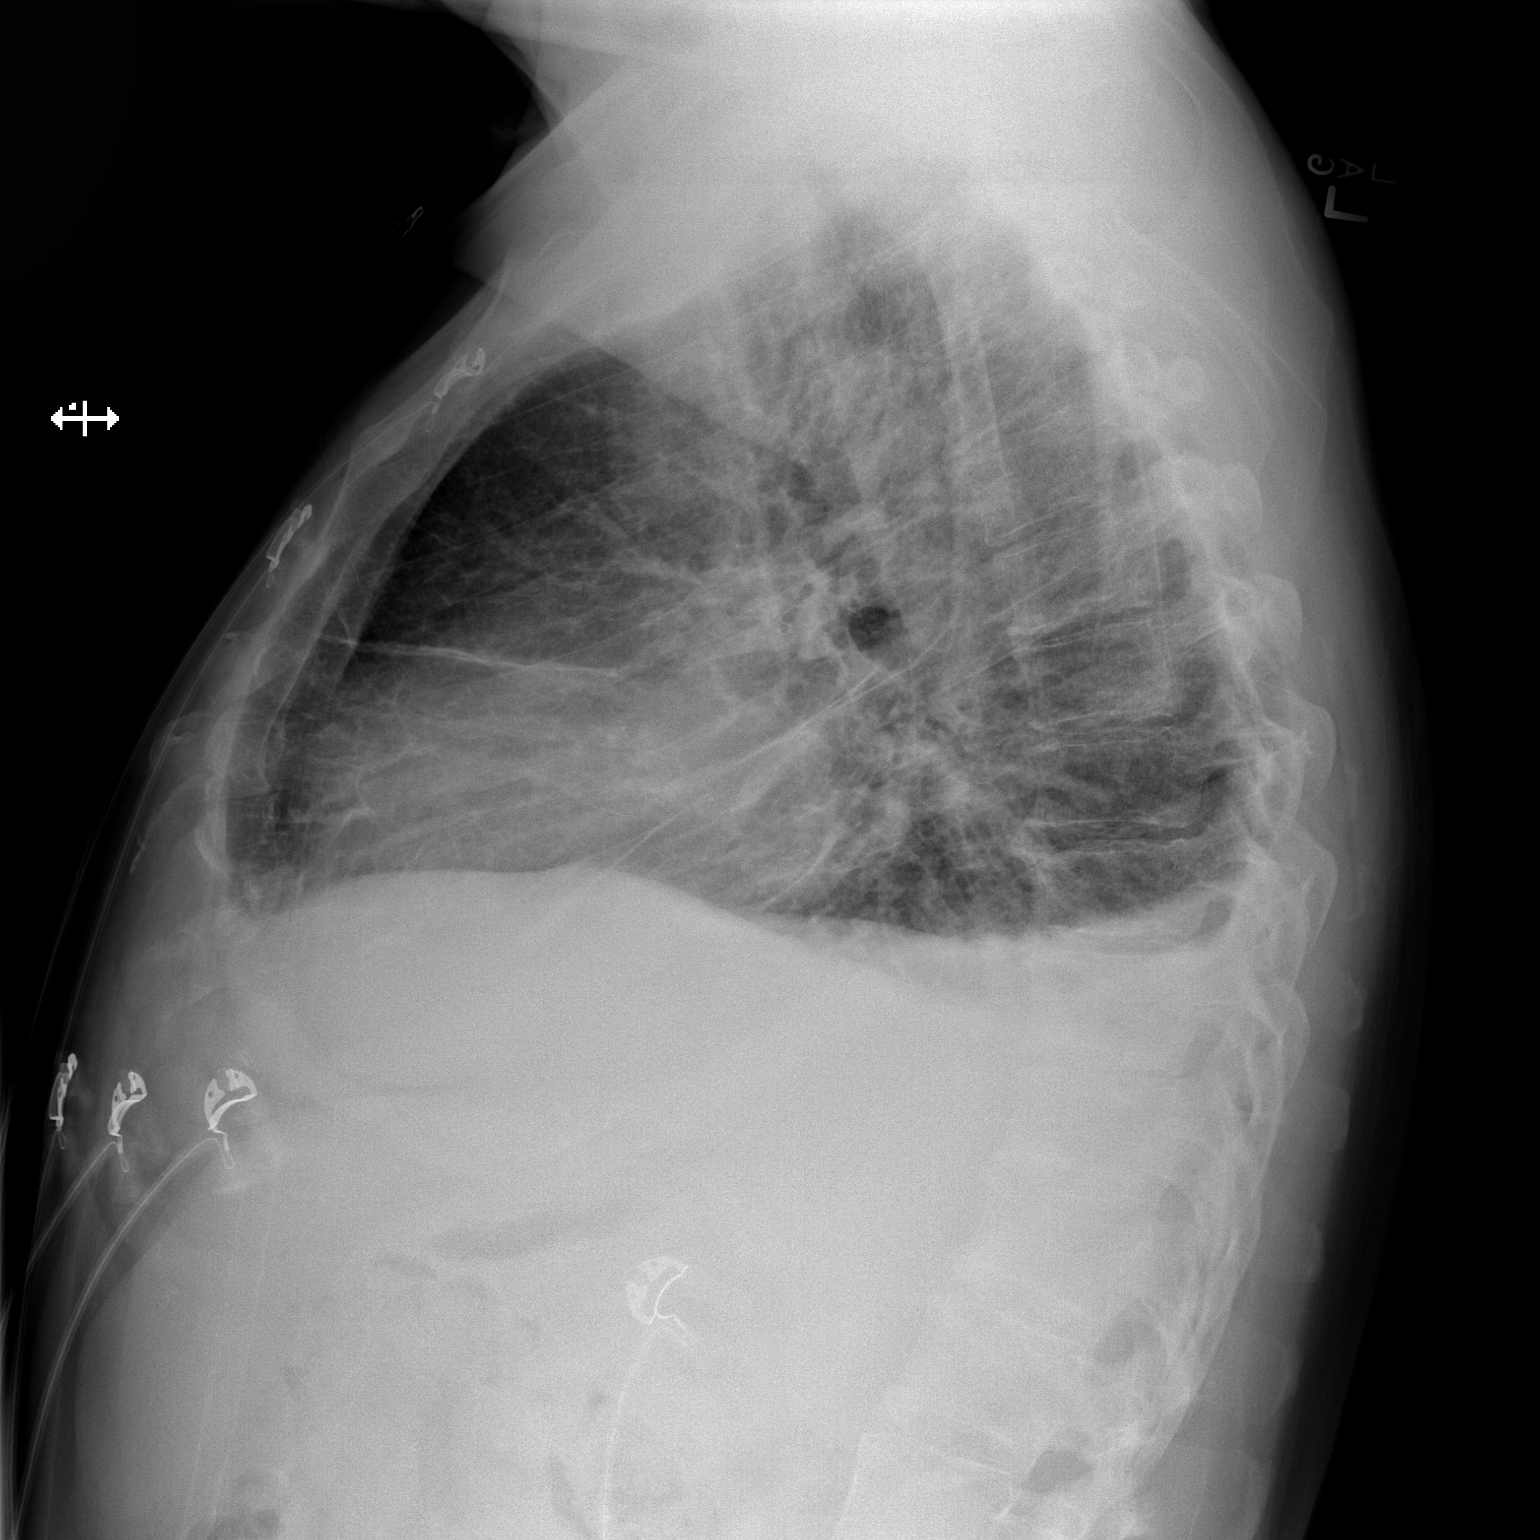

[2 of 2 positions shown; findings below may reference images not displayed]

FINDINGS: Normal cardiac silhouette.  There is increased bilateral
pleural effusions compared to prior.  These are small in volume.
There is a mild interstitial edema pattern.  There is central
bronchitic markings are similar to prior.  No focal consolidation.
No pneumothorax.
IMPRESSION: Bilateral small effusions interstitial edema which is mild.

## 2014-06-17 ENCOUNTER — Other Ambulatory Visit: Payer: Self-pay | Admitting: Family

## 2014-06-22 ENCOUNTER — Other Ambulatory Visit: Payer: Self-pay | Admitting: Family

## 2014-07-07 ENCOUNTER — Other Ambulatory Visit (HOSPITAL_COMMUNITY): Payer: Self-pay | Admitting: Internal Medicine

## 2014-07-28 ENCOUNTER — Other Ambulatory Visit (HOSPITAL_COMMUNITY): Payer: Self-pay | Admitting: Internal Medicine

## 2014-07-30 NOTE — Telephone Encounter (Signed)
Refill request for Dr Benshimon 

## 2014-08-05 ENCOUNTER — Other Ambulatory Visit (HOSPITAL_COMMUNITY): Payer: Self-pay | Admitting: Internal Medicine

## 2014-08-11 ENCOUNTER — Other Ambulatory Visit: Payer: Self-pay | Admitting: Family

## 2014-08-25 ENCOUNTER — Other Ambulatory Visit: Payer: Self-pay | Admitting: Family

## 2014-09-02 ENCOUNTER — Other Ambulatory Visit: Payer: Self-pay | Admitting: Family

## 2014-09-08 ENCOUNTER — Other Ambulatory Visit (HOSPITAL_COMMUNITY): Payer: Self-pay | Admitting: Internal Medicine

## 2014-09-15 ENCOUNTER — Other Ambulatory Visit: Payer: Self-pay | Admitting: Family

## 2014-09-15 ENCOUNTER — Other Ambulatory Visit (HOSPITAL_COMMUNITY): Payer: Self-pay | Admitting: Internal Medicine

## 2014-09-21 ENCOUNTER — Other Ambulatory Visit (HOSPITAL_COMMUNITY): Payer: Self-pay | Admitting: Internal Medicine

## 2014-09-25 ENCOUNTER — Other Ambulatory Visit: Payer: Self-pay | Admitting: Family

## 2014-10-12 ENCOUNTER — Other Ambulatory Visit (HOSPITAL_COMMUNITY): Payer: Self-pay | Admitting: Internal Medicine

## 2014-10-19 ENCOUNTER — Other Ambulatory Visit: Payer: Self-pay | Admitting: Family

## 2014-10-19 ENCOUNTER — Other Ambulatory Visit (HOSPITAL_COMMUNITY): Payer: Self-pay | Admitting: Internal Medicine

## 2014-11-09 ENCOUNTER — Other Ambulatory Visit (HOSPITAL_COMMUNITY): Payer: Self-pay | Admitting: Internal Medicine

## 2014-11-16 ENCOUNTER — Other Ambulatory Visit (HOSPITAL_COMMUNITY): Payer: Self-pay | Admitting: Internal Medicine

## 2014-12-06 ENCOUNTER — Other Ambulatory Visit: Payer: Self-pay | Admitting: Family

## 2014-12-14 ENCOUNTER — Other Ambulatory Visit (HOSPITAL_COMMUNITY): Payer: Self-pay | Admitting: Internal Medicine

## 2014-12-14 ENCOUNTER — Other Ambulatory Visit: Payer: Self-pay | Admitting: Family

## 2014-12-21 ENCOUNTER — Other Ambulatory Visit (HOSPITAL_COMMUNITY): Payer: Self-pay | Admitting: Internal Medicine

## 2014-12-21 ENCOUNTER — Other Ambulatory Visit: Payer: Self-pay | Admitting: Family

## 2014-12-28 ENCOUNTER — Other Ambulatory Visit: Payer: Self-pay | Admitting: Family

## 2015-01-04 ENCOUNTER — Other Ambulatory Visit (HOSPITAL_COMMUNITY): Payer: Self-pay | Admitting: Internal Medicine

## 2015-01-04 ENCOUNTER — Other Ambulatory Visit: Payer: Self-pay | Admitting: Family

## 2015-01-11 ENCOUNTER — Other Ambulatory Visit (HOSPITAL_COMMUNITY): Payer: Self-pay | Admitting: Internal Medicine

## 2015-01-14 ENCOUNTER — Ambulatory Visit (INDEPENDENT_AMBULATORY_CARE_PROVIDER_SITE_OTHER): Payer: BLUE CROSS/BLUE SHIELD | Admitting: Adult Health

## 2015-01-14 ENCOUNTER — Encounter: Payer: Self-pay | Admitting: Adult Health

## 2015-01-14 VITALS — BP 120/80 | Temp 98.4°F | Ht 71.0 in | Wt 203.2 lb

## 2015-01-14 DIAGNOSIS — Z760 Encounter for issue of repeat prescription: Secondary | ICD-10-CM

## 2015-01-14 DIAGNOSIS — E119 Type 2 diabetes mellitus without complications: Secondary | ICD-10-CM | POA: Diagnosis not present

## 2015-01-14 LAB — BASIC METABOLIC PANEL WITH GFR
BUN: 21 mg/dL (ref 7–25)
CHLORIDE: 99 mmol/L (ref 98–110)
CO2: 30 mmol/L (ref 20–31)
Calcium: 9.6 mg/dL (ref 8.6–10.3)
Creat: 1.02 mg/dL (ref 0.70–1.33)
GFR, EST NON AFRICAN AMERICAN: 84 mL/min (ref >=60)
GFR, Est African American: >89 mL/min (ref >=60)
Glucose, Bld: 317 mg/dL — ABNORMAL HIGH (ref 65–99)
Potassium: 5.2 mmol/L (ref 3.5–5.3)
SODIUM: 134 mmol/L — AB (ref 135–146)

## 2015-01-14 LAB — HEMOGLOBIN A1C: Hgb A1c MFr Bld: 11.8 % — ABNORMAL HIGH (ref 4.6–6.5)

## 2015-01-14 MED ORDER — CLOTRIMAZOLE-BETAMETHASONE 1-0.05 % EX CREA: 1.0000 "application " | TOPICAL_CREAM | Freq: Two times a day (BID) | CUTANEOUS | Status: DC

## 2015-01-14 MED ORDER — ZOLPIDEM TARTRATE 10 MG PO TABS: 10.0000 mg | ORAL_TABLET | Freq: Every evening | ORAL | Status: DC | PRN

## 2015-01-14 MED ORDER — METFORMIN HCL ER 500 MG PO TB24: 500.0000 mg | ORAL_TABLET | Freq: Every day | ORAL | Status: DC

## 2015-01-14 NOTE — Progress Notes (Signed)
Pre visit review using our clinic review tool, if applicable. No additional management support is needed unless otherwise documented below in the visit note. 

## 2015-01-14 NOTE — Progress Notes (Signed)
Subjective:    Patient ID: Dustin Lozano, male    DOB: 1961-01-02, 54 y.o.   MRN: [id number]  HPI  54 year old male who presents to the office for medication refill. He was seeing Np Justin Mend and will be establishing with me.   He is no longer seeing Dr.Ellison, because " We didn't get along and I got frustrated." He is not checking his blood sugars and hasn't in many months. His last a1c was in 01/2014 at which time it was 11.0. He is taking 552m Metformin XR and Glipizide. He does watch what he eats and tries to walk 2 miles a day. Weight has been steady around 200 pounds  He understands his risks that go along with diabetes but does not seem to care. His mother had diabetes with lower extremity amputations.   Overall, he feels  Well.   Review of Systems  Constitutional: Negative.   Eyes: Negative.   Respiratory: Negative.   Cardiovascular: Negative.   Genitourinary: Negative.   Musculoskeletal: Negative.   Neurological: Negative.   All other systems reviewed and are negative.  Past Medical History  Diagnosis Date  . HTN (hypertension)   . Diabetes mellitus   . CHF (congestive heart failure) (HCC)     Systolic - LVEF 254-27% (70/6237  . Coronary artery disease     Nonobstructive disease per cath 08/2011    Social History   Social History  . Marital Status: Single    Spouse Name: N/A  . Number of Children: N/A  . Years of Education: N/A   Occupational History  . salesman    Social History Main Topics  . Smoking status: Former Smoker -- 0.70 packs/day for 15 years    Quit date: 02/08/2006  . Smokeless tobacco: Never Used  . Alcohol Use: Yes     Comment: rare  . Drug Use: No  . Sexual Activity: Not on file   Other Topics Concern  . Not on file   Social History Narrative    Past Surgical History  Procedure Laterality Date  . Appendectomy    . Left and right heart catheterization with coronary angiogram N/A 08/17/2011    Procedure: LEFT AND RIGHT HEART  CATHETERIZATION WITH CORONARY ANGIOGRAM;  Surgeon: MSherren Mocha MD;  [facility];  Service: Cardiovascular;  Laterality: N/A;    Family History  Problem Relation Age of Onset  . Diabetes Father   . Heart failure Father   . Cancer Mother     lymphoma  . Diabetes Mother   . Colon cancer Neg Hx   . Esophageal cancer Neg Hx   . Rectal cancer Neg Hx   . Stomach cancer Neg Hx     No Known Allergies  Current Outpatient Prescriptions on File Prior to Visit  Medication Sig Dispense Refill  . aspirin EC 81 MG tablet Take 81 mg by mouth daily.    . carvedilol (COREG) 12.5 MG tablet TAKE ONE AND ONE-HALF TABLETS BY MOUTH TWICE DAILY WITH A MEAL 90 tablet 0  . carvedilol (COREG) 25 MG tablet Take 1/2 tab in AM and 1 tab in PM (Patient taking differently: Takes  1 tab in PM) 45 tablet 3  . gabapentin (NEURONTIN) 100 MG capsule TAKE ONE CAPSULE BY MOUTH THREE TIMES DAILY.NEED TO SCHEDULE APPOINTMENT  TO ESTABLISH WITH A PROVIDER 270 capsule 0  . gemfibrozil (LOPID) 600 MG tablet TAKE ONE TABLET BY MOUTH TWICE DAILY BEFORE  A  MEAL. 1[place]  tablet 0  . glipiZIDE (GLUCOTROL) 10 MG tablet TAKE ONE TABLET BY MOUTH TWICE DAILY BEFORE  A  MEAL 180 tablet 0  . Lancets (FREESTYLE) lancets Use as instructed 100 each 12  . losartan (COZAAR) 100 MG tablet TAKE ONE TABLET BY MOUTH ONCE DAILY 30 tablet 0  . Multiple Vitamin (MULTIVITAMIN WITH MINERALS) TABS Take 1 tablet by mouth daily.    Marland Kitchen spironolactone (ALDACTONE) 25 MG tablet TAKE ONE-HALF TABLET BY MOUTH ONCE DAILY 15 tablet 0  . FREESTYLE LITE test strip USE ONE STRIP TO CHECK GLUCOSE TWICE DAILY. (Patient not taking: Reported on 01/14/2015) 100 each 2  . nitroGLYCERIN (NITROSTAT) 0.4 MG SL tablet Place 1 tablet (0.4 mg total) under the tongue every 5 (five) minutes as needed for chest pain (CP or SOB). 20 tablet 0   No current facility-administered medications on file prior to visit.    BP 120/80 mmHg  Temp(Src) 98.4 F (36.9 C) (Oral)  Ht  5' 11"  (1.803 m)  Wt 203 lb 3.2 oz (92.171 kg)  BMI 28.35 kg/m2       Objective:   Physical Exam  Constitutional: He is oriented to person, place, and time. He appears well-developed and well-nourished. No distress.  Cardiovascular: Normal rate, regular rhythm, normal heart sounds and intact distal pulses.  Exam reveals no gallop and no friction rub.   No murmur heard. Pulmonary/Chest: Effort normal and breath sounds normal. No respiratory distress. He has no wheezes. He has no rales. He exhibits no tenderness.  Musculoskeletal: Normal range of motion.  Neurological: He is alert and oriented to person, place, and time.  Skin: Skin is warm and dry. No rash noted. He is not diaphoretic. No erythema. No pallor.  Psychiatric: He has a normal mood and affect. His behavior is normal. Judgment and thought content normal.  Nursing note and vitals reviewed.     Assessment & Plan:  .1. Medication refill - zolpidem (AMBIEN) 10 MG tablet; Take 1 tablet (10 mg total) by mouth at bedtime as needed. for sleep  Dispense: 15 tablet; Refill: 0 - metFORMIN (GLUCOPHAGE-XR) 500 MG 24 hr tablet; Take 1 tablet (500 mg total) by mouth daily with breakfast.  Dispense: 60 tablet; Refill: 5 - clotrimazole-betamethasone (LOTRISONE) cream; Apply 1 application topically 2 (two) times daily.  Dispense: 30 g; Refill: 0  2. Diabetes mellitus type 2, noninsulin dependent (HCC) - metFORMIN (GLUCOPHAGE-XR) 500 MG 24 hr tablet; Take 1 tablet (500 mg total) by mouth daily with breakfast.  Dispense: 60 tablet; Refill: 5 - BMP with eGFR - Hemoglobin A1c  * make an appointment for establish care

## 2015-01-14 NOTE — Patient Instructions (Signed)
Start monitoring your blood sugars day and night, write down your readings and bring them to your establish care visit.   Take all medications as directed.   Let me know if you need anything.

## 2015-01-16 ENCOUNTER — Telehealth: Payer: Self-pay

## 2015-01-16 DIAGNOSIS — Z760 Encounter for issue of repeat prescription: Secondary | ICD-10-CM

## 2015-01-16 DIAGNOSIS — E119 Type 2 diabetes mellitus without complications: Secondary | ICD-10-CM

## 2015-01-16 NOTE — Telephone Encounter (Signed)
Pt called and states he has been accepted in to a diabetic study with [facility].  Pt would like Cory to call back so he can give him details.

## 2015-01-17 ENCOUNTER — Telehealth: Payer: Self-pay | Admitting: Adult Health

## 2015-01-17 NOTE — Telephone Encounter (Signed)
Left vm to return call.    

## 2015-01-20 ENCOUNTER — Telehealth: Payer: Self-pay | Admitting: Adult Health

## 2015-01-20 NOTE — Telephone Encounter (Signed)
Attempted to call patient and speak to him about the diabetes study he has been enrolled in. Left VM to call back.

## 2015-01-21 NOTE — Telephone Encounter (Signed)
Patient will be back in town on Friday 01/24/15 and the doctor can call him then.

## 2015-01-24 MED ORDER — METFORMIN HCL ER (OSM) 1000 MG PO TB24: 1000.0000 mg | ORAL_TABLET | Freq: Every day | ORAL | Status: DC

## 2015-01-24 NOTE — Telephone Encounter (Signed)
Spoke to [place] on the phone today about the diabetes study he is going to be in. I think it will be helpful but his last A1c was 12. I recommend that we start insulin therapy at this time, Dustin Lozano does not want to start insulin thearpy because it will take him out of the study. I educated him on the complications from diabetes and his blood sugars being so high. He has an understanding of this. He would like the opportunity to work on diet and exercise and participate in this program. I will increase his Metformin to 1000mg  . He has the understanding that if he does not watch his diet and does not start exercising then in three months we will be starting him on insulin.

## 2015-01-25 ENCOUNTER — Other Ambulatory Visit (HOSPITAL_COMMUNITY): Payer: Self-pay | Admitting: Internal Medicine

## 2015-01-29 ENCOUNTER — Encounter: Payer: Self-pay | Admitting: Adult Health

## 2015-01-29 ENCOUNTER — Ambulatory Visit (INDEPENDENT_AMBULATORY_CARE_PROVIDER_SITE_OTHER): Payer: BLUE CROSS/BLUE SHIELD | Admitting: Adult Health

## 2015-01-29 VITALS — BP 110/76 | HR 88 | Temp 97.8°F | Ht 71.0 in | Wt 197.7 lb

## 2015-01-29 DIAGNOSIS — J069 Acute upper respiratory infection, unspecified: Secondary | ICD-10-CM | POA: Diagnosis not present

## 2015-01-29 MED ORDER — AMOXICILLIN-POT CLAVULANATE 875-125 MG PO TABS: 1.0000 | ORAL_TABLET | Freq: Two times a day (BID) | ORAL | Status: DC

## 2015-01-29 MED ORDER — HYDROCODONE-HOMATROPINE 5-1.5 MG/5ML PO SYRP
5.0000 mL | ORAL_SOLUTION | Freq: Three times a day (TID) | ORAL | Status: DC | PRN
Start: 2015-01-29 — End: 2015-04-15

## 2015-01-29 NOTE — Patient Instructions (Signed)
It was great seeing you again!  I am sorry you are feeling this bad. I have sent in a prescription for Augmentin, take this twice a day for 10 days.   Use the cough syrup as needed in the evening as it will make you sleepy.

## 2015-01-29 NOTE — Progress Notes (Signed)
Subjective:    Patient ID: Dustin Lozano, male    DOB: 1961/01/19, 54 y.o.   MRN: [id number]  HPI   54 year old male who presents to the office today for an acute complaint of URI type symptoms. His symptoms include: subjective fever, body aches, sinus pain and pressure, productive cough, feeling of chest congestion, and PND. He denies having nausea/vomiting/diarrhea. His symptoms started 6 days ago. He started to feel better 3 days into the symptoms but then the symptoms came back worse than before.   - He has been using OTC cough medication which is not helping.      Review of Systems  Constitutional: Negative.   HENT: Positive for congestion, postnasal drip and sinus pressure (maxillary sinus pressure). Negative for ear discharge, ear pain, rhinorrhea, sore throat and trouble swallowing.   Eyes: Negative.   Respiratory: Positive for cough. Negative for choking, chest tightness and wheezing.   Cardiovascular: Negative.   Musculoskeletal: Positive for myalgias.  Skin: Negative.   Neurological: Positive for headaches. Negative for weakness.  Psychiatric/Behavioral: Positive for sleep disturbance.  All other systems reviewed and are negative.  Past Medical History  Diagnosis Date  . HTN (hypertension)   . Diabetes mellitus   . CHF (congestive heart failure) (HCC)     Systolic - LVEF [id number]  ([id number])  . Coronary artery disease     Nonobstructive disease per cath 08/2011    Social History   Social History  . Marital Status: Single    Spouse Name: N/A  . Number of Children: N/A  . Years of Education: N/A   Occupational History  . salesman    Social History Main Topics  . Smoking status: Former Smoker -- 0.70 packs/day for 15 years    Quit date: 02/08/2006  . Smokeless tobacco: Never Used  . Alcohol Use: Yes     Comment: rare  . Drug Use: No  . Sexual Activity: Not on file   Other Topics Concern  . Not on file   Social History Narrative    Past Surgical  History  Procedure Laterality Date  . Appendectomy    . Left and right heart catheterization with coronary angiogram N/A 08/17/2011    Procedure: LEFT AND RIGHT HEART CATHETERIZATION WITH CORONARY ANGIOGRAM;  Surgeon: Sherren Mocha, MD;  Location: [facility] CATH LAB;  Service: Cardiovascular;  Laterality: N/A;    Family History  Problem Relation Age of Onset  . Diabetes Father   . Heart failure Father   . Cancer Mother     lymphoma  . Diabetes Mother   . Colon cancer Neg Hx   . Esophageal cancer Neg Hx   . Rectal cancer Neg Hx   . Stomach cancer Neg Hx     No Known Allergies  Current Outpatient Prescriptions on File Prior to Visit  Medication Sig Dispense Refill  . aspirin EC 81 MG tablet Take 81 mg by mouth daily.    . carvedilol (COREG) 12.5 MG tablet TAKE ONE AND ONE-HALF TABLETS BY MOUTH TWICE DAILY WITH A MEAL 90 tablet 0  . carvedilol (COREG) 25 MG tablet Take 1/2 tab in AM and 1 tab in PM (Patient taking differently: Takes  1 tab in PM) 45 tablet 3  . clotrimazole-betamethasone (LOTRISONE) cream Apply 1 application topically 2 (two) times daily. 30 g 0  . FREESTYLE LITE test strip USE ONE STRIP TO CHECK GLUCOSE TWICE DAILY. 100 each 2  . gabapentin (NEURONTIN) 100 MG capsule TAKE ONE  CAPSULE BY MOUTH THREE TIMES DAILY.NEED TO SCHEDULE APPOINTMENT  TO ESTABLISH WITH A PROVIDER 270 capsule 0  . gemfibrozil (LOPID) 600 MG tablet TAKE ONE TABLET BY MOUTH TWICE DAILY BEFORE  A  MEAL. 180 tablet 0  . glipiZIDE (GLUCOTROL) 10 MG tablet TAKE ONE TABLET BY MOUTH TWICE DAILY BEFORE  A  MEAL 180 tablet 0  . Lancets (FREESTYLE) lancets Use as instructed 100 each 12  . losartan (COZAAR) 100 MG tablet TAKE ONE TABLET BY MOUTH ONCE DAILY 30 tablet 0  . metFORMIN (FORTAMET) 1000 MG (OSM) 24 hr tablet Take 1 tablet (1,000 mg total) by mouth daily with breakfast. 30 tablet 3  . Multiple Vitamin (MULTIVITAMIN WITH MINERALS) TABS Take 1 tablet by mouth daily.    Marland Kitchen spironolactone (ALDACTONE) 25 MG  tablet TAKE ONE-HALF TABLET BY MOUTH ONCE DAILY 15 tablet 0  . zolpidem (AMBIEN) 10 MG tablet Take 1 tablet (10 mg total) by mouth at bedtime as needed. for sleep 15 tablet 0  . nitroGLYCERIN (NITROSTAT) 0.4 MG SL tablet Place 1 tablet (0.4 mg total) under the tongue every 5 (five) minutes as needed for chest pain (CP or SOB). 20 tablet 0   No current facility-administered medications on file prior to visit.    BP 110/76 mmHg  Pulse 88  Temp(Src) 97.8 F (36.6 C) (Oral)  Ht 5\' 11"  (1.803 m)  Wt 197 lb 11.2 oz (89.676 kg)  BMI 27.59 kg/m2  SpO2 97%       Objective:   Physical Exam  Constitutional: He is oriented to person, place, and time. He appears well-developed and well-nourished. No distress.  HENT:  Head: Normocephalic and atraumatic.  Right Ear: External ear normal.  Left Ear: External ear normal.  Nose: Nose normal.  Mouth/Throat: Oropharynx is clear and moist. No oropharyngeal exudate.  Eyes: Conjunctivae and EOM are normal. Pupils are equal, round, and reactive to light. Right eye exhibits no discharge. Left eye exhibits no discharge. No scleral icterus.  Neck: Normal range of motion. Neck supple. No JVD present. No tracheal deviation present. No thyromegaly present.  Cardiovascular: Normal rate, regular rhythm, normal heart sounds and intact distal pulses.  Exam reveals no gallop and no friction rub.   No murmur heard. Pulmonary/Chest: Effort normal and breath sounds normal. No respiratory distress. He has no wheezes. He has no rales. He exhibits no tenderness.  Abdominal: Soft. Bowel sounds are normal. He exhibits no distension and no mass. There is no tenderness. There is no rebound and no guarding.  Musculoskeletal: Normal range of motion. He exhibits no edema or tenderness.  Lymphadenopathy:    He has no cervical adenopathy.  Neurological: He is alert and oriented to person, place, and time. He has normal reflexes. He displays normal reflexes. No cranial nerve  deficit. He exhibits normal muscle tone. Coordination normal.  Skin: Skin is warm and dry. No rash noted. He is not diaphoretic. No erythema.  Psychiatric: He has a normal mood and affect. His behavior is normal. Judgment and thought content normal.  Nursing note and vitals reviewed.      Assessment & Plan:  1. Acute upper respiratory infection - HYDROcodone-homatropine (HYCODAN) 5-1.5 MG/5ML syrup; Take 5 mLs by mouth every 8 (eight) hours as needed for cough.  Dispense: 120 mL; Refill: 0 - amoxicillin-clavulanate (AUGMENTIN) 875-125 MG tablet; Take 1 tablet by mouth 2 (two) times daily.  Dispense: 20 tablet; Refill: 0 - Follow up if no improvement in the next 2-3 days.

## 2015-01-29 NOTE — Progress Notes (Signed)
Pre visit review using our clinic review tool, if applicable. No additional management support is needed unless otherwise documented below in the visit note. 

## 2015-02-06 ENCOUNTER — Telehealth: Payer: Self-pay | Admitting: Family

## 2015-02-06 NOTE — Telephone Encounter (Signed)
Patient Name: Dustin Lozano DOB: [birth year] Initial Comment Caller States saw dr. for cold and put on meds. been on for 8 days, not feeling any better. cough, nasal, fever 99-100, lethargic. sees NP corey at the clinic Nurse Assessment Nurse: Ronnald Ramp, RN, Miranda Date/Time (Eastern Time): 02/06/2015 11:16:48 AM Confirm and document reason for call. If symptomatic, describe symptoms. ---Caller states he has been on antibiotics since last week for URI. Still has congestion, cough, and fever (off and on). Has the patient traveled out of the country within the last 30 days? ---Not Applicable Does the patient have any new or worsening symptoms? ---Yes Will a triage be completed? ---Yes Related visit to physician within the last 2 weeks? ---Yes Does the PT have any chronic conditions? (i.e. diabetes, asthma, etc.) ---No Is this a behavioral health or substance abuse call? ---No Guidelines Guideline Title Affirmed Question Affirmed Notes Sinus Infection on Antibiotic Follow-up Call [1] Taking antibiotic > 72 hours (3 days) AND [2] sinus pain not improved Final Disposition User See Physician within 24 Hours Jones, Therapist, sports, Miranda Comments No appts available today and caller did not want to set up appt for tomorrow while on the phone with the nurse. He states he will just call back later. Referrals REFERRED TO PCP OFFICE Disagree/Comply: Comply

## 2015-02-06 NOTE — Telephone Encounter (Signed)
pls advise

## 2015-02-06 NOTE — Telephone Encounter (Signed)
If still febrile needs follow up to reassess.

## 2015-02-06 NOTE — Telephone Encounter (Signed)
Contacted patient. He states that he is still having a fever, cough & congestion. Patient scheduled 02/11/15. Advised patient that if symptoms get worse then to go to urgent care. Patient verbalized understanding.

## 2015-02-11 ENCOUNTER — Ambulatory Visit: Payer: BLUE CROSS/BLUE SHIELD | Admitting: Internal Medicine

## 2015-02-27 ENCOUNTER — Telehealth (HOSPITAL_COMMUNITY): Payer: Self-pay | Admitting: Vascular Surgery

## 2015-02-27 NOTE — Telephone Encounter (Signed)
Pt states his pharmacy has been trying to contact this office for 2 weeks for refills on Spironlactone and Losartan  PHARMACY [facility] [address].. Please advise

## 2015-02-28 NOTE — Telephone Encounter (Signed)
Jasmine can you please take of this, thanks

## 2015-03-03 ENCOUNTER — Other Ambulatory Visit (HOSPITAL_COMMUNITY): Payer: Self-pay | Admitting: *Deleted

## 2015-03-03 MED ORDER — LOSARTAN POTASSIUM 100 MG PO TABS: 100.0000 mg | ORAL_TABLET | Freq: Every day | ORAL | Status: DC

## 2015-03-03 MED ORDER — SPIRONOLACTONE 25 MG PO TABS: 12.5000 mg | ORAL_TABLET | Freq: Every day | ORAL | Status: DC

## 2015-03-12 ENCOUNTER — Telehealth: Payer: Self-pay | Admitting: Family

## 2015-03-12 NOTE — Telephone Encounter (Signed)
Pls advise.  

## 2015-03-12 NOTE — Telephone Encounter (Signed)
Pt needs new rx glipizide 10 mg #180 and gabapentin #270. Pt has new pharmacy [facility] [address] . Pt said  [facility] is his new pcp

## 2015-03-13 MED ORDER — GABAPENTIN 100 MG PO CAPS: ORAL_CAPSULE | ORAL | Status: DC

## 2015-03-13 MED ORDER — GLIPIZIDE 10 MG PO TABS: ORAL_TABLET | ORAL | Status: DC

## 2015-03-13 NOTE — Telephone Encounter (Signed)
He has a follow up with me in March, but is he establishing with me?   He can have one month,plus one refill. That will give him enough time to get established with me or someone else.   Glipizide 10 mg, 60 pills, 1 refill.   Gabapentin - 90 pills, 1 refill

## 2015-03-13 NOTE — Telephone Encounter (Signed)
Rx sent to pharmacy   

## 2015-03-17 ENCOUNTER — Other Ambulatory Visit (HOSPITAL_COMMUNITY): Payer: Self-pay | Admitting: *Deleted

## 2015-03-17 MED ORDER — CARVEDILOL 12.5 MG PO TABS: ORAL_TABLET | ORAL | Status: DC

## 2015-03-24 ENCOUNTER — Other Ambulatory Visit (HOSPITAL_COMMUNITY): Payer: Self-pay | Admitting: *Deleted

## 2015-03-24 MED ORDER — SPIRONOLACTONE 25 MG PO TABS: 12.5000 mg | ORAL_TABLET | Freq: Every day | ORAL | Status: DC

## 2015-03-24 MED ORDER — LOSARTAN POTASSIUM 100 MG PO TABS: 100.0000 mg | ORAL_TABLET | Freq: Every day | ORAL | Status: DC

## 2015-04-15 ENCOUNTER — Ambulatory Visit (INDEPENDENT_AMBULATORY_CARE_PROVIDER_SITE_OTHER): Payer: BLUE CROSS/BLUE SHIELD | Admitting: Adult Health

## 2015-04-15 ENCOUNTER — Telehealth: Payer: Self-pay

## 2015-04-15 ENCOUNTER — Encounter: Payer: Self-pay | Admitting: Adult Health

## 2015-04-15 VITALS — BP 124/84 | Temp 98.2°F | Ht 71.0 in | Wt 203.3 lb

## 2015-04-15 DIAGNOSIS — E119 Type 2 diabetes mellitus without complications: Secondary | ICD-10-CM

## 2015-04-15 LAB — BASIC METABOLIC PANEL
BUN: 20 mg/dL (ref 6–23)
CALCIUM: 9.5 mg/dL (ref 8.4–10.5)
CO2: 26 meq/L (ref 19–32)
Chloride: 98 mEq/L (ref 96–112)
Creatinine, Ser: 1.05 mg/dL (ref 0.40–1.50)
GFR: 78.67 mL/min (ref >60.00)
GLUCOSE: 306 mg/dL — AB (ref 70–99)
Potassium: 4.6 mEq/L (ref 3.5–5.1)
SODIUM: 134 meq/L — AB (ref 135–145)

## 2015-04-15 LAB — HEMOGLOBIN A1C: Hgb A1c MFr Bld: 11 % — ABNORMAL HIGH (ref 4.6–6.5)

## 2015-04-15 NOTE — Addendum Note (Signed)
Addended by: Elmer Picker on: 04/15/2015 08:29 AM   Modules accepted: Orders

## 2015-04-15 NOTE — Telephone Encounter (Signed)
Ok to refill for one month  

## 2015-04-15 NOTE — Progress Notes (Signed)
Subjective:    Patient ID: Dustin Lozano, male    DOB: 03/21/1960, 56 y.o.   MRN: [id number]  HPI  55 year old male who presents to the office today for three months follow up regarding diabetes. I last saw him in December 2015 and at that time his A1c was 11.8. He is taking Metformin 538m XR and Glipizide 10 mg. He is very reluctant to go on any other medications. [facility] also [facility]   He reports today that he feels like his diet is " ok" and he is walking.   Per his glucometer his estimated A1c was 9.8. His usual BS is in the 200's.   He reports tingling in his feet.    Review of Systems  Constitutional: Negative.   Respiratory: Negative.   Cardiovascular: Negative.   Genitourinary: Negative.   Skin: Negative.   Neurological: Negative.   All other systems reviewed and are negative.  Past Medical History  Diagnosis Date  . HTN (hypertension)   . Diabetes mellitus   . CHF (congestive heart failure) (HCC)     Systolic - LVEF 231-54% (70/0867  . Coronary artery disease     Nonobstructive disease per cath 08/2011    Social History   Social History  . Marital Status: Single    Spouse Name: N/A  . Number of Children: N/A  . Years of Education: N/A   Occupational History  . salesman    Social History Main Topics  . Smoking status: Former Smoker -- 0.70 packs/day for 15 years    Quit date: 02/08/2006  . Smokeless tobacco: Never Used  . Alcohol Use: Yes     Comment: rare  . Drug Use: No  . Sexual Activity: Not on file   Other Topics Concern  . Not on file   Social History Narrative    Past Surgical History  Procedure Laterality Date  . Appendectomy    . Left and right heart catheterization with coronary angiogram N/A 08/17/2011    Procedure: LEFT AND RIGHT HEART CATHETERIZATION WITH CORONARY ANGIOGRAM;  Surgeon: MSherren Mocha MD;  [facility];  Service: Cardiovascular;  Laterality: N/A;    Family  History  Problem Relation Age of Onset  . Diabetes Father   . Heart failure Father   . Cancer Mother     lymphoma  . Diabetes Mother   . Colon cancer Neg Hx   . Esophageal cancer Neg Hx   . Rectal cancer Neg Hx   . Stomach cancer Neg Hx     No Known Allergies  Current Outpatient Prescriptions on File Prior to Visit  Medication Sig Dispense Refill  . aspirin EC 81 MG tablet Take 81 mg by mouth daily.    . carvedilol (COREG) 12.5 MG tablet TAKE ONE AND ONE-HALF TABLETS BY MOUTH TWICE DAILY WITH A MEAL (Patient taking differently: Take 12.5 mg by mouth every morning. TAKE ONE AND ONE-HALF TABLETS BY MOUTH TWICE DAILY WITH A MEAL) 90 tablet 0  . carvedilol (COREG) 25 MG tablet Take 1/2 tab in AM and 1 tab in PM (Patient taking differently: Take 25 mg by mouth every evening. Takes  1 tab in PM) 45 tablet 3  . clotrimazole-betamethasone (LOTRISONE) cream Apply 1 application topically 2 (two) times daily. 30 g 0  . FREESTYLE LITE test strip USE ONE STRIP TO CHECK GLUCOSE TWICE DAILY. 100 each 2  . gabapentin (NEURONTIN) 100 MG capsule  TAKE ONE CAPSULE BY MOUTH THREE TIMES DAILY. 90 capsule 1  . gemfibrozil (LOPID) 600 MG tablet TAKE ONE TABLET BY MOUTH TWICE DAILY BEFORE  A  MEAL. 180 tablet 0  . glipiZIDE (GLUCOTROL) 10 MG tablet TAKE ONE TABLET BY MOUTH TWICE DAILY BEFORE  A  MEAL 60 tablet 1  . HYDROcodone-homatropine (HYCODAN) 5-1.5 MG/5ML syrup Take 5 mLs by mouth every 8 (eight) hours as needed for cough. 120 mL 0  . Lancets (FREESTYLE) lancets Use as instructed 100 each 12  . losartan (COZAAR) 100 MG tablet Take 1 tablet (100 mg total) by mouth daily. 30 tablet 3  . metFORMIN (FORTAMET) 1000 MG (OSM) 24 hr tablet Take 1 tablet (1,000 mg total) by mouth daily with breakfast. 30 tablet 3  . Multiple Vitamin (MULTIVITAMIN WITH MINERALS) TABS Take 1 tablet by mouth daily.    Marland Kitchen spironolactone (ALDACTONE) 25 MG tablet Take 0.5 tablets (12.5 mg total) by mouth daily. 15 tablet 3  . zolpidem  (AMBIEN) 10 MG tablet Take 1 tablet (10 mg total) by mouth at bedtime as needed. for sleep 15 tablet 0  . nitroGLYCERIN (NITROSTAT) 0.4 MG SL tablet Place 1 tablet (0.4 mg total) under the tongue every 5 (five) minutes as needed for chest pain (CP or SOB). 20 tablet 0   No current facility-administered medications on file prior to visit.    BP 124/84 mmHg  Temp(Src) 98.2 F (36.8 C) (Oral)  Ht _0  (1.803 m)  Wt 203 lb 4.8 oz (92.216 kg)  BMI 28.37 kg/m2     Objective:   Physical Exam  Constitutional: He is oriented to person, place, and time. He appears well-developed and well-nourished. No distress.  HENT:  Mouth/Throat: Oropharynx is clear and moist.  Cardiovascular: Normal rate, regular rhythm, normal heart sounds and intact distal pulses.  Exam reveals no gallop and no friction rub.   No murmur heard. Pulmonary/Chest: Effort normal and breath sounds normal. No respiratory distress. He has no wheezes. He has no rales. He exhibits no tenderness.  Neurological: He is alert and oriented to person, place, and time.  Skin: Skin is warm and dry. No rash noted. He is not diaphoretic. No erythema. No pallor.  Psychiatric: He has a normal mood and affect. His behavior is normal. Judgment and thought content normal.  Nursing note and vitals reviewed.     Assessment & Plan:  1. Diabetes mellitus type 2, noninsulin dependent ([place]) - Amb Referral to Nutrition and Diabetic E - Hemoglobin A1c - BMP with eGFR - Consider adding additional medication

## 2015-04-15 NOTE — Telephone Encounter (Signed)
Pt request zolpidem (AMBIEN) 10 MG tablet Pt last visit 04/15/15 Pt last refill 01/14/15 #15

## 2015-04-15 NOTE — Progress Notes (Signed)
Pre visit review using our clinic review tool, if applicable. No additional management support is needed unless otherwise documented below in the visit note. 

## 2015-04-15 NOTE — Patient Instructions (Signed)
It was great seeing you again!  Hopefully, we are making progress. I will follow up with you regarding your blood work.   Continue to work on diet and exercise.   We may need to change up some of the medications.

## 2015-04-16 ENCOUNTER — Ambulatory Visit (INDEPENDENT_AMBULATORY_CARE_PROVIDER_SITE_OTHER): Payer: BLUE CROSS/BLUE SHIELD | Admitting: Adult Health

## 2015-04-16 ENCOUNTER — Encounter: Payer: Self-pay | Admitting: Adult Health

## 2015-04-16 VITALS — BP 120/86 | Temp 98.8°F | Wt 203.0 lb

## 2015-04-16 DIAGNOSIS — E119 Type 2 diabetes mellitus without complications: Secondary | ICD-10-CM

## 2015-04-16 MED ORDER — DULAGLUTIDE 0.75 MG/0.5ML ~~LOC~~ SOAJ: 0.7500 mg | SUBCUTANEOUS | Status: DC

## 2015-04-16 NOTE — Telephone Encounter (Signed)
Spoke to Dustin Lozano on the phone and informed him of his A1c of 11. We are going to start him on Trulicity. He will come in today to be shown how to use it.

## 2015-04-16 NOTE — Progress Notes (Signed)
   Subjective:    Patient ID: Dustin Lozano, male    DOB: 1960-12-01, 55 y.o.   MRN: [id number]  HPI  55 year old male, who returns to the clinic today for Trulicity. His A1c was 11 and he is on Dual therapy with Metformin and Glipizide. He does not want to go on insulin at this time. He is willing to try Trulicity.    Review of Systems  All other systems reviewed and are negative.      Objective:   Physical Exam  Constitutional: He appears well-developed and well-nourished. No distress.  Neurological: He is alert.  Skin: Skin is warm and dry. No rash noted. He is not diaphoretic. No erythema. No pallor.  Psychiatric: He has a normal mood and affect. His behavior is normal. Judgment and thought content normal.  Vitals reviewed.     Assessment & Plan:  1. Diabetes mellitus type 2, noninsulin dependent ([place]) - Demonstrated how to use the Trulicity pens and patient was able to demonstrate back. He successfully injected himself in the left deltoid - All questions answered - Dulaglutide (TRULICITY) [id number] SOPN; Inject 0.75 mg into the skin once a week.  Dispense: 4 pen; Refill: 6

## 2015-04-16 NOTE — Telephone Encounter (Signed)
Noted. appt scheduled

## 2015-04-21 ENCOUNTER — Other Ambulatory Visit: Payer: Self-pay | Admitting: *Deleted

## 2015-04-21 MED ORDER — GEMFIBROZIL 600 MG PO TABS: ORAL_TABLET | ORAL | Status: DC

## 2015-04-22 ENCOUNTER — Other Ambulatory Visit: Payer: Self-pay

## 2015-04-22 DIAGNOSIS — Z760 Encounter for issue of repeat prescription: Secondary | ICD-10-CM

## 2015-04-22 MED ORDER — ZOLPIDEM TARTRATE 10 MG PO TABS: 10.0000 mg | ORAL_TABLET | Freq: Every evening | ORAL | Status: DC | PRN

## 2015-04-22 NOTE — Telephone Encounter (Signed)
Rx request from [facility] for Zolpidem 10 mg tablet  Ok per [place] to send rx in.  Rx sent to pharmacy.

## 2015-05-05 ENCOUNTER — Telehealth: Payer: Self-pay | Admitting: Family

## 2015-05-05 NOTE — Telephone Encounter (Signed)
Pt said his insurance company denied the following med  Dulaglutide (TRULICITY) [id number] SOPN. He said he was informed by his insurance company that he need try [employer]

## 2015-05-05 NOTE — Telephone Encounter (Signed)
Please advise 

## 2015-05-07 NOTE — Telephone Encounter (Signed)
I called and spoke with patient - he states that he received a letter stating that Trulicity has been denied and he would have to try one formulary alternative which would be Victoza. Patient stated that he would be okay trying Victoza - as long as Tommi Rumps thinks it is okay.

## 2015-05-09 ENCOUNTER — Telehealth: Payer: Self-pay | Admitting: Adult Health

## 2015-05-09 ENCOUNTER — Other Ambulatory Visit: Payer: Self-pay

## 2015-05-09 ENCOUNTER — Other Ambulatory Visit: Payer: Self-pay | Admitting: Adult Health

## 2015-05-09 MED ORDER — INSULIN PEN NEEDLE 32G X 4 MM MISC: Status: AC

## 2015-05-09 MED ORDER — LIRAGLUTIDE 18 MG/3ML ~~LOC~~ SOPN: PEN_INJECTOR | SUBCUTANEOUS | Status: DC

## 2015-05-09 NOTE — Telephone Encounter (Signed)
Spoke to [place] on the phone. He is willing to try Victoza, as this is what his insurance company recommends. He will follow up in 3 weeks to see how he is doing on the medication.

## 2015-05-09 NOTE — Telephone Encounter (Signed)
Pt is present need Rx needles for previous Rx that was called in.

## 2015-05-09 NOTE — Telephone Encounter (Signed)
Rx sent in

## 2015-05-13 ENCOUNTER — Telehealth: Payer: Self-pay | Admitting: Family

## 2015-05-13 ENCOUNTER — Other Ambulatory Visit: Payer: Self-pay

## 2015-05-13 MED ORDER — GLIPIZIDE 10 MG PO TABS: ORAL_TABLET | ORAL | Status: DC

## 2015-05-13 NOTE — Telephone Encounter (Signed)
Rx refilled.

## 2015-05-13 NOTE — Telephone Encounter (Signed)
Pt needs refill on glipizide 10 mg #180 for 90 day supply send to [facility]

## 2015-05-13 NOTE — Telephone Encounter (Signed)
Ok to refill. He is established with me.

## 2015-05-13 NOTE — Telephone Encounter (Signed)
Patient has not yet established with new PCP - ok to refill?

## 2015-05-20 ENCOUNTER — Encounter: Payer: BLUE CROSS/BLUE SHIELD | Attending: Adult Health | Admitting: *Deleted

## 2015-05-20 ENCOUNTER — Encounter: Payer: Self-pay | Admitting: *Deleted

## 2015-05-20 VITALS — Wt 202.2 lb

## 2015-05-20 DIAGNOSIS — E119 Type 2 diabetes mellitus without complications: Secondary | ICD-10-CM | POA: Diagnosis not present

## 2015-05-20 NOTE — Patient Instructions (Signed)
Plan:  Aim for 3-4 Carb Choices per meal (45-60 grams) +/- 1 either way  Aim for 0-15 Carbs per snack if hungry  Include protein in moderation with your meals and snacks Consider reading food labels for Total Carbohydrate and Fat Grams of foods Consider  increasing your activity level by walkint for 30 minutes daily as tolerated Continue checking BG at alternate times per day as directed by MD  Continue taking medication as directed by MD  Consider eating 3 real (pseudo) meals per day [facility] protein bars Kind bars [employer]  It has been a pleasure to meet with you. Please feel free to contact me as you feel the need.

## 2015-05-20 NOTE — Progress Notes (Signed)
Diabetes Self-Management Education  Visit Type: First/Initial  Appt. Start Time: 0800 Appt. End Time: 0930  05/20/2015  [name], identified by name and date of birth, is a 55 y.o. male with a diagnosis of Diabetes: Type 2. Dustin Lozano presents with a 10 year history of T2DM. He has had a gradual progression of challenges with management. In 2016 she stopped testing and glucose or managing any dietary intake. Both of his parents have passed away due to complication of poorly controlled T2DM. He is actively participating in the [place] supported by [facility]. Is is finding this to be very helpful in keeping himself on track. He has realized that he isn't able to do it on his own. He is back on tracking and working hard at management.  ASSESSMENT  Weight 202 lb 3.2 oz (91.717 kg). Body mass index is 28.21 kg/(m^2).      Diabetes Self-Management Education - 05/20/15 0836    Visit Information   Visit Type First/Initial   Initial Visit   Diabetes Type Type 2   Are you currently following a meal plan? No   Are you taking your medications as prescribed? Yes   Health Coping   How would you rate your overall health? Fair   Psychosocial Assessment   Self-care barriers None   Self-management support Doctor's office;CDE visits   Other persons present Patient   Patient Concerns Nutrition/Meal planning;Medication;Monitoring;Healthy Lifestyle;Glycemic Control   Special Needs None   Preferred Learning Style No preference indicated   Learning Readiness Change in progress   Complications   Last HgB A1C per patient/outside source 11 %   How often do you check your blood sugar? 1-2 times/day   Have you had a dilated eye exam in the past 12 months? No   Have you had a dental exam in the past 12 months? No   Are you checking your feet? Yes   How many days per week are you checking your feet? 7   Dietary Intake   Breakfast none   Lunch chicken pot pie, green beans, water / spaghetti  meat sauce /    Dinner none   Snack (evening) sargento balanced breaks / pop corn   Exercise   Exercise Type Light (walking / raking leaves)   How many days per week to you exercise? 7   How many minutes per day do you exercise? 30   Total minutes per week of exercise 210   Patient Education   Previous Diabetes Education Yes (please comment)   Disease state  Definition of diabetes, type 1 and 2, and the diagnosis of diabetes;Factors that contribute to the development of diabetes   Nutrition management  Role of diet in the treatment of diabetes and the relationship between the three main macronutrients and blood glucose level;Food label reading, portion sizes and measuring food.;Carbohydrate counting;Meal options for control of blood glucose level and chronic complications.   Physical activity and exercise  Role of exercise on diabetes management, blood pressure control and cardiac health.   Acute complications Taught treatment of hypoglycemia - the 15 rule.   Chronic complications Relationship between chronic complications and blood glucose control;Identified and discussed with patient  current chronic complications;Retinopathy and reason for yearly dilated eye exams;Nephropathy, what it is, prevention of, the use of ACE, ARB's and early detection of through urine microalbumia.   Psychosocial adjustment Travel strategies   Individualized Goals (developed by patient)   Nutrition General guidelines for healthy choices and portions discussed  Physical Activity Exercise 5-7 days per week;30 minutes per day   Medications take my medication as prescribed   Monitoring  test my blood glucose as discussed   Reducing Risk increase portions of nuts and seeds   Outcomes   Expected Outcomes Demonstrated interest in learning. Expect positive outcomes   Future DMSE PRN   Program Status Completed      Individualized Plan for Diabetes Self-Management Training:   Learning Objective:  Patient will have  a greater understanding of diabetes self-management. Patient education plan is to attend individual and/or group sessions per assessed needs and concerns.   Plan:   Patient Instructions  Plan:  Aim for 3-4 Carb Choices per meal (45-60 grams) +/- 1 either way  Aim for 0-15 Carbs per snack if hungry  Include protein in moderation with your meals and snacks Consider reading food labels for Total Carbohydrate and Fat Grams of foods Consider  increasing your activity level by walkint for 30 minutes daily as tolerated Continue checking BG at alternate times per day as directed by MD  Continue taking medication as directed by MD  Consider eating 3 real (pseudo) meals per day [facility] protein bars Kind bars [employer]  It has been a pleasure to meet with you. Please feel free to contact me as you feel the need.  Expected Outcomes:  Demonstrated interest in learning. Expect positive outcomes  Education material provided: Living Well with Diabetes, A1C conversion sheet, Meal plan card, My Plate and Snack sheet  If problems or questions, patient to contact team via:  Phone  Future DSME appointment: PRN

## 2015-06-02 ENCOUNTER — Other Ambulatory Visit (HOSPITAL_COMMUNITY): Payer: Self-pay | Admitting: *Deleted

## 2015-06-02 MED ORDER — CARVEDILOL 12.5 MG PO TABS: ORAL_TABLET | ORAL | Status: DC

## 2015-06-10 ENCOUNTER — Other Ambulatory Visit: Payer: Self-pay | Admitting: Adult Health

## 2015-06-18 ENCOUNTER — Encounter: Payer: Self-pay | Admitting: Adult Health

## 2015-06-18 ENCOUNTER — Ambulatory Visit (INDEPENDENT_AMBULATORY_CARE_PROVIDER_SITE_OTHER): Payer: BLUE CROSS/BLUE SHIELD | Admitting: Adult Health

## 2015-06-18 ENCOUNTER — Telehealth: Payer: Self-pay | Admitting: Family Medicine

## 2015-06-18 VITALS — BP 120/70 | Temp 98.4°F | Wt 151.0 lb

## 2015-06-18 DIAGNOSIS — E119 Type 2 diabetes mellitus without complications: Secondary | ICD-10-CM | POA: Diagnosis not present

## 2015-06-18 LAB — POCT GLYCOSYLATED HEMOGLOBIN (HGB A1C): Hemoglobin A1C: 8.8

## 2015-06-18 MED ORDER — DULAGLUTIDE 1.5 MG/0.5ML ~~LOC~~ SOAJ: 1.5000 mg | SUBCUTANEOUS | Status: DC

## 2015-06-18 NOTE — Telephone Encounter (Signed)
Prior auth submitted electronically on 06/18/15. Waiting on response.

## 2015-06-18 NOTE — Patient Instructions (Signed)
A1c is 8.8! Great Job.   Switch to Trulicity, so you remember to take it once a week.   Follow up in three month

## 2015-06-18 NOTE — Progress Notes (Signed)
Subjective:    Patient ID: Dustin Lozano, male    DOB: December 05, 1960, 55 y.o.   MRN: [id number]  HPI  55 year old male who presents to the office today for follow up regarding diabetes. I last saw him on 04/16/2015 when his A1c was 11, At that time we trialed him on Trulicity. Unfortunately, insurance would not cover this medication. He was then placed on Victoza.   He has seen a diabetic educator   Today in the office he reports that " I think it is going ok"  His blood sugars have been averaging under 180. Unfortunately, he is having a hard time remembering to take his medication every day. He has skipped multiple days over the last month. He would like to go back to Trulicity as it was easier for him to remember to take.   Denies any acute complaints.     Review of Systems  Constitutional: Negative.   Respiratory: Negative.   Cardiovascular: Negative.   Gastrointestinal: Negative.   Genitourinary: Negative.   Neurological: Negative.   All other systems reviewed and are negative.  Past Medical History  Diagnosis Date  . HTN (hypertension)   . Diabetes mellitus   . CHF (congestive heart failure) (HCC)     Systolic - LVEF 123456  (0000000)   . Coronary artery disease     Nonobstructive disease per cath 08/2011    Social History   Social History  . Marital Status: Single    Spouse Name: N/A  . Number of Children: N/A  . Years of Education: N/A   Occupational History  . salesman    Social History Main Topics  . Smoking status: Former Smoker -- 0.70 packs/day for 15 years    Quit date: 02/08/2006  . Smokeless tobacco: Never Used  . Alcohol Use: Yes     Comment: rare  . Drug Use: No  . Sexual Activity: Not on file   Other Topics Concern  . Not on file   Social History Narrative    Past Surgical History  Procedure Laterality Date  . Appendectomy    . Left and right heart catheterization with coronary angiogram N/A 08/17/2011    Procedure: LEFT AND RIGHT HEART  CATHETERIZATION WITH CORONARY ANGIOGRAM;  Surgeon: Sherren Mocha, MD;  Location: [facility] CATH LAB;  Service: Cardiovascular;  Laterality: N/A;    Family History  Problem Relation Age of Onset  . Diabetes Father   . Heart failure Father   . Cancer Mother     lymphoma  . Diabetes Mother   . Colon cancer Neg Hx   . Esophageal cancer Neg Hx   . Rectal cancer Neg Hx   . Stomach cancer Neg Hx     No Known Allergies  Current Outpatient Prescriptions on File Prior to Visit  Medication Sig Dispense Refill  . aspirin EC 81 MG tablet Take 81 mg by mouth daily.    . carvedilol (COREG) 12.5 MG tablet TAKE ONE AND ONE-HALF TABLETS BY MOUTH TWICE DAILY WITH A MEAL 90 tablet 3  . carvedilol (COREG) 25 MG tablet Take 1/2 tab in AM and 1 tab in PM (Patient taking differently: Take 25 mg by mouth every evening. Takes  1 tab in PM) 45 tablet 3  . clotrimazole-betamethasone (LOTRISONE) cream Apply 1 application topically 2 (two) times daily. 30 g 0  . Dulaglutide (TRULICITY) A999333 0000000 SOPN Inject 0.75 mg into the skin once a week. 4 pen 6  . FREESTYLE LITE  test strip USE ONE STRIP TO CHECK GLUCOSE TWICE DAILY. 100 each 2  . gabapentin (NEURONTIN) 100 MG capsule TAKE ONE CAPSULE BY MOUTH THREE TIMES A DAY 90 capsule 0  . gemfibrozil (LOPID) 600 MG tablet TAKE ONE TABLET BY MOUTH TWICE DAILY BEFORE  A  MEAL. 180 tablet 1  . glipiZIDE (GLUCOTROL) 10 MG tablet TAKE ONE TABLET BY MOUTH TWICE DAILY BEFORE  A  MEAL 180 tablet 0  . Insulin Pen Needle (BD PEN NEEDLE NANO U/F) 32G X 4 MM MISC Test blood sugars as directed. 30 each 0  . Lancets (FREESTYLE) lancets Use as instructed 100 each 12  . Liraglutide 18 MG/3ML SOPN 0.6 mg SubQ daily x7 days then 1.2 mg SubQ  daily 6 mL 3  . losartan (COZAAR) 100 MG tablet Take 1 tablet (100 mg total) by mouth daily. 30 tablet 3  . metFORMIN (FORTAMET) 1000 MG (OSM) 24 hr tablet Take 1 tablet (1,000 mg total) by mouth daily with breakfast. 30 tablet 3  . Multiple Vitamin  (MULTIVITAMIN WITH MINERALS) TABS Take 1 tablet by mouth daily.    Marland Kitchen spironolactone (ALDACTONE) 25 MG tablet Take 0.5 tablets (12.5 mg total) by mouth daily. 15 tablet 3  . zolpidem (AMBIEN) 10 MG tablet Take 1 tablet (10 mg total) by mouth at bedtime as needed. for sleep 30 tablet 0  . nitroGLYCERIN (NITROSTAT) 0.4 MG SL tablet Place 1 tablet (0.4 mg total) under the tongue every 5 (five) minutes as needed for chest pain (CP or SOB). 20 tablet 0   No current facility-administered medications on file prior to visit.    BP 120/70 mmHg  Temp(Src) 98.4 F (36.9 C) (Oral)  Wt 151 lb (68.493 kg)       Objective:   Physical Exam  Constitutional: He is oriented to person, place, and time. He appears well-developed and well-nourished. No distress.  Cardiovascular: Normal rate, regular rhythm, normal heart sounds and intact distal pulses.  Exam reveals no gallop and no friction rub.   No murmur heard. Pulmonary/Chest: Effort normal and breath sounds normal. No respiratory distress. He has no wheezes. He has no rales. He exhibits no tenderness.  Neurological: He is alert and oriented to person, place, and time.  Skin: Skin is warm and dry. No rash noted. He is not diaphoretic. No erythema. No pallor.  Psychiatric: He has a normal mood and affect. His behavior is normal. Judgment and thought content normal.  Nursing note and vitals reviewed.     Assessment & Plan:  1. Diabetes mellitus type 2, noninsulin dependent (HCC)  - POC HgB A1c- 8.8 - Sample of Trulicity given  - He has made great progress in the last few months. Continue to eat healthy and start exercising.   - Follow up in July or sooner if needed

## 2015-06-27 ENCOUNTER — Telehealth: Payer: Self-pay | Admitting: Adult Health

## 2015-06-27 ENCOUNTER — Other Ambulatory Visit: Payer: Self-pay

## 2015-06-27 DIAGNOSIS — E119 Type 2 diabetes mellitus without complications: Secondary | ICD-10-CM

## 2015-06-27 DIAGNOSIS — Z760 Encounter for issue of repeat prescription: Secondary | ICD-10-CM

## 2015-06-27 MED ORDER — METFORMIN HCL ER (OSM) 1000 MG PO TB24: 1000.0000 mg | ORAL_TABLET | Freq: Every day | ORAL | Status: DC

## 2015-06-27 NOTE — Telephone Encounter (Signed)
Pt request refill of the following:  metFORMIN (FORTAMET) 1000 MG (OSM) 24 hr tablet  Phamacy:[facility] [address]

## 2015-06-27 NOTE — Telephone Encounter (Signed)
Rx refilled to requested pharmacy

## 2015-06-30 ENCOUNTER — Other Ambulatory Visit: Payer: Self-pay

## 2015-06-30 DIAGNOSIS — E119 Type 2 diabetes mellitus without complications: Secondary | ICD-10-CM

## 2015-06-30 DIAGNOSIS — Z760 Encounter for issue of repeat prescription: Secondary | ICD-10-CM

## 2015-06-30 MED ORDER — METFORMIN HCL ER (OSM) 1000 MG PO TB24: 1000.0000 mg | ORAL_TABLET | Freq: Every day | ORAL | Status: DC

## 2015-07-15 ENCOUNTER — Ambulatory Visit: Payer: BLUE CROSS/BLUE SHIELD | Admitting: Adult Health

## 2015-07-26 ENCOUNTER — Other Ambulatory Visit: Payer: Self-pay | Admitting: Adult Health

## 2015-07-26 ENCOUNTER — Other Ambulatory Visit: Payer: Self-pay | Admitting: Internal Medicine

## 2015-07-28 NOTE — Telephone Encounter (Signed)
Refill sent to pharmacy.   

## 2015-08-20 ENCOUNTER — Other Ambulatory Visit: Payer: Self-pay | Admitting: Adult Health

## 2015-08-20 ENCOUNTER — Telehealth: Payer: Self-pay | Admitting: Family Medicine

## 2015-08-20 DIAGNOSIS — Z760 Encounter for issue of repeat prescription: Secondary | ICD-10-CM

## 2015-08-20 NOTE — Telephone Encounter (Signed)
It looks like last refill states that patient must establish for further refills. Patient has not yet established with a provider - ok to refill?

## 2015-08-20 NOTE — Telephone Encounter (Signed)
Ok to refill  90 days with 1 refill

## 2015-08-20 NOTE — Telephone Encounter (Signed)
Refill request for Zolpidem 10 mg, pt was here on 06/18/2015, however doesn't look like the pt has established with anyone.

## 2015-08-22 MED ORDER — ZOLPIDEM TARTRATE 10 MG PO TABS: 10.0000 mg | ORAL_TABLET | Freq: Every evening | ORAL | Status: DC | PRN

## 2015-08-22 NOTE — Telephone Encounter (Signed)
I called in script 

## 2015-08-22 NOTE — Telephone Encounter (Signed)
Ok to refill for one month  

## 2015-10-09 ENCOUNTER — Other Ambulatory Visit (INDEPENDENT_AMBULATORY_CARE_PROVIDER_SITE_OTHER): Payer: BLUE CROSS/BLUE SHIELD

## 2015-10-09 DIAGNOSIS — Z0000 Encounter for general adult medical examination without abnormal findings: Secondary | ICD-10-CM | POA: Diagnosis not present

## 2015-10-09 LAB — LIPID PANEL
CHOL/HDL RATIO: 4
Cholesterol: 143 mg/dL (ref 0–200)
HDL: 38.6 mg/dL — ABNORMAL LOW (ref >39.00)
LDL CALC: 82 mg/dL (ref 0–99)
NonHDL: 104.76
TRIGLYCERIDES: 113 mg/dL (ref 0.0–149.0)
VLDL: 22.6 mg/dL (ref 0.0–40.0)

## 2015-10-09 LAB — CBC WITH DIFFERENTIAL/PLATELET
BASOS ABS: 0 10*3/uL (ref 0.0–0.1)
Basophils Relative: 0.6 % (ref 0.0–3.0)
EOS PCT: 5.9 % — AB (ref 0.0–5.0)
Eosinophils Absolute: 0.5 10*3/uL (ref 0.0–0.7)
HCT: 40.5 % (ref 39.0–52.0)
HEMOGLOBIN: 13.8 g/dL (ref 13.0–17.0)
LYMPHS ABS: 2.2 10*3/uL (ref 0.7–4.0)
Lymphocytes Relative: 24.4 % (ref 12.0–46.0)
MCHC: 34 g/dL (ref 30.0–36.0)
MCV: 87.3 fl (ref 78.0–100.0)
MONO ABS: 0.7 10*3/uL (ref 0.1–1.0)
MONOS PCT: 8.2 % (ref 3.0–12.0)
NEUTROS PCT: 60.9 % (ref 43.0–77.0)
Neutro Abs: 5.5 10*3/uL (ref 1.4–7.7)
Platelets: 324 10*3/uL (ref 150.0–400.0)
RBC: 4.65 Mil/uL (ref 4.22–5.81)
RDW: 13 % (ref 11.5–15.5)
WBC: 9 10*3/uL (ref 4.0–10.5)

## 2015-10-09 LAB — POC URINALSYSI DIPSTICK (AUTOMATED)
BILIRUBIN UA: NEGATIVE
Glucose, UA: NEGATIVE
KETONES UA: NEGATIVE
Leukocytes, UA: NEGATIVE
Nitrite, UA: NEGATIVE
PH UA: 6
PROTEIN UA: NEGATIVE
RBC UA: NEGATIVE
SPEC GRAV UA: 1.02
Urobilinogen, UA: 0.2

## 2015-10-09 LAB — BASIC METABOLIC PANEL
BUN: 21 mg/dL (ref 6–23)
CALCIUM: 9.4 mg/dL (ref 8.4–10.5)
CO2: 26 mEq/L (ref 19–32)
Chloride: 104 mEq/L (ref 96–112)
Creatinine, Ser: 1.05 mg/dL (ref 0.40–1.50)
GFR: 78.53 mL/min (ref >60.00)
Glucose, Bld: 153 mg/dL — ABNORMAL HIGH (ref 70–99)
POTASSIUM: 4.8 meq/L (ref 3.5–5.1)
SODIUM: 136 meq/L (ref 135–145)

## 2015-10-09 LAB — HEMOGLOBIN A1C: Hgb A1c MFr Bld: 7.7 % — ABNORMAL HIGH (ref 4.6–6.5)

## 2015-10-09 LAB — HEPATIC FUNCTION PANEL
ALK PHOS: 67 U/L (ref 39–117)
ALT: 24 U/L (ref 0–53)
AST: 20 U/L (ref 0–37)
Albumin: 4.3 g/dL (ref 3.5–5.2)
BILIRUBIN TOTAL: 0.4 mg/dL (ref 0.2–1.2)
Bilirubin, Direct: 0.1 mg/dL (ref 0.0–0.3)
Total Protein: 7.1 g/dL (ref 6.0–8.3)

## 2015-10-09 LAB — MICROALBUMIN / CREATININE URINE RATIO
Creatinine,U: 154 mg/dL
MICROALB/CREAT RATIO: 0.9 mg/g (ref 0.0–30.0)
Microalb, Ur: 1.4 mg/dL (ref 0.0–1.9)

## 2015-10-09 LAB — TSH: TSH: 1.09 u[IU]/mL (ref 0.35–4.50)

## 2015-10-09 LAB — PSA: PSA: 1.12 ng/mL (ref 0.10–4.00)

## 2015-10-16 ENCOUNTER — Encounter: Payer: BLUE CROSS/BLUE SHIELD | Admitting: Adult Health

## 2015-10-22 ENCOUNTER — Encounter: Payer: BLUE CROSS/BLUE SHIELD | Admitting: Adult Health

## 2015-10-22 ENCOUNTER — Ambulatory Visit (INDEPENDENT_AMBULATORY_CARE_PROVIDER_SITE_OTHER): Payer: BLUE CROSS/BLUE SHIELD | Admitting: Adult Health

## 2015-10-22 ENCOUNTER — Encounter: Payer: Self-pay | Admitting: Adult Health

## 2015-10-22 VITALS — Wt 201.0 lb

## 2015-10-22 DIAGNOSIS — I10 Essential (primary) hypertension: Secondary | ICD-10-CM

## 2015-10-22 DIAGNOSIS — E1141 Type 2 diabetes mellitus with diabetic mononeuropathy: Secondary | ICD-10-CM | POA: Diagnosis not present

## 2015-10-22 DIAGNOSIS — E119 Type 2 diabetes mellitus without complications: Secondary | ICD-10-CM

## 2015-10-22 DIAGNOSIS — E1140 Type 2 diabetes mellitus with diabetic neuropathy, unspecified: Secondary | ICD-10-CM | POA: Insufficient documentation

## 2015-10-22 DIAGNOSIS — Z0000 Encounter for general adult medical examination without abnormal findings: Secondary | ICD-10-CM | POA: Diagnosis not present

## 2015-10-22 MED ORDER — GABAPENTIN 300 MG PO CAPS: 300.0000 mg | ORAL_CAPSULE | Freq: Two times a day (BID) | ORAL | 3 refills | Status: DC

## 2015-10-22 MED ORDER — METFORMIN HCL ER 500 MG PO TB24: 1000.0000 mg | ORAL_TABLET | Freq: Every day | ORAL | 3 refills | Status: DC

## 2015-10-22 NOTE — Progress Notes (Signed)
Subjective:    Patient ID: Dustin Lozano, male    DOB: 1960/07/31, 55 y.o.   MRN: [id number]  HPI Patient presents for yearly preventative medicine examination. He is a pleasant 55 year old male who  has a past medical history of CHF (congestive heart failure) ([place]); Coronary artery disease; Diabetes mellitus; and HTN (hypertension).  All immunizations and health maintenance protocols were reviewed with the patient and needed orders were placed.  Medication reconciliation,  past medical history, social history, problem list and allergies were reviewed in detail with the patient  Goals were established with regard to weight loss, exercise, and  diet in compliance with medications  He is up to date on his colonoscopy and dental care. He has not had his diabetic eye exam this year  He reports that his blood sugars have been under control, usually between 90-150. His home meter shows a A1c of 6.8. His most recent one in the office was 7.7. He has been exercising and watching his diet. He reports feeling " really good." His current regimen in Metformin ER 1000mg , Trulicity, and Glipizide. He does continue to report significant numbness and tingling in his lower extremities. He has been using Neurontin 100mg  BID and feels as though this is not working well.   He has no acute complaints today  Review of Systems  Constitutional: Negative.   HENT: Negative.   Eyes: Negative.   Respiratory: Negative.   Cardiovascular: Negative.   Gastrointestinal: Negative.   Endocrine: Negative.   Genitourinary: Negative.   Musculoskeletal: Negative.   Skin: Negative.   Allergic/Immunologic: Negative.   Neurological: Negative.   Hematological: Negative.   Psychiatric/Behavioral: Negative.   All other systems reviewed and are negative.  Past Medical History:  Diagnosis Date  . CHF (congestive heart failure) (HCC)    Systolic - LVEF 123456  (0000000)   . Coronary artery disease    Nonobstructive  disease per cath 08/2011  . Diabetes mellitus   . HTN (hypertension)     Social History   Social History  . Marital status: Single    Spouse name: N/A  . Number of children: N/A  . Years of education: N/A   Occupational History  . salesman    Social History Main Topics  . Smoking status: Former Smoker    Packs/day: 0.70    Years: 15.00    Quit date: 02/08/2006  . Smokeless tobacco: Never Used  . Alcohol use Yes     Comment: rare  . Drug use: No  . Sexual activity: Not on file   Other Topics Concern  . Not on file   Social History Narrative  . No narrative on file    Past Surgical History:  Procedure Laterality Date  . APPENDECTOMY    . LEFT AND RIGHT HEART CATHETERIZATION WITH CORONARY ANGIOGRAM N/A 08/17/2011   Procedure: LEFT AND RIGHT HEART CATHETERIZATION WITH CORONARY ANGIOGRAM;  Surgeon: Sherren Mocha, MD;  Location: [facility] CATH LAB;  Service: Cardiovascular;  Laterality: N/A;    Family History  Problem Relation Age of Onset  . Diabetes Father   . Heart failure Father   . Cancer Mother     lymphoma  . Diabetes Mother   . Colon cancer Neg Hx   . Esophageal cancer Neg Hx   . Rectal cancer Neg Hx   . Stomach cancer Neg Hx     No Known Allergies  Current Outpatient Prescriptions on File Prior to Visit  Medication Sig Dispense Refill  .  aspirin EC 81 MG tablet Take 81 mg by mouth daily.    . carvedilol (COREG) 12.5 MG tablet TAKE ONE AND ONE-HALF TABLETS BY MOUTH TWICE DAILY WITH A MEAL (Patient taking differently: daily before breakfast. TAKE 1 TABLET BY MOUTH EVERY MORNING) 90 tablet 3  . carvedilol (COREG) 25 MG tablet Take 1/2 tab in AM and 1 tab in PM (Patient taking differently: Take 25 mg by mouth every evening. TAKE 1 TABLET BY MOUTH EVERY EVENING) 45 tablet 3  . clotrimazole-betamethasone (LOTRISONE) cream Apply 1 application topically 2 (two) times daily. 30 g 0  . Dulaglutide (TRULICITY) 1.5 0000000 SOPN Inject 1.5 mg into the skin once a week. 4 pen  6  . FREESTYLE LITE test strip USE ONE STRIP TO CHECK GLUCOSE TWICE DAILY. 100 each 2  . gabapentin (NEURONTIN) 100 MG capsule TAKE ONE CAPSULE BY MOUTH THREE TIMES A DAY 90 capsule 1  . gemfibrozil (LOPID) 600 MG tablet TAKE ONE TABLET BY MOUTH TWICE DAILY BEFORE  A  MEAL. 180 tablet 1  . glipiZIDE (GLUCOTROL) 10 MG tablet TAKE ONE TABLET BY MOUTH TWICE DAILY BEFORE  A  MEAL 90 tablet 1  . Insulin Pen Needle (BD PEN NEEDLE NANO U/F) 32G X 4 MM MISC Test blood sugars as directed. 30 each 0  . Lancets (FREESTYLE) lancets Use as instructed 100 each 12  . losartan (COZAAR) 100 MG tablet TAKE 1 TABLET (100 MG TOTAL) BY MOUTH DAILY. 30 tablet 2  . metformin (FORTAMET) 1000 MG (OSM) 24 hr tablet Take 1 tablet (1,000 mg total) by mouth daily with breakfast. 30 tablet 3  . Multiple Vitamin (MULTIVITAMIN WITH MINERALS) TABS Take 1 tablet by mouth daily.    Marland Kitchen spironolactone (ALDACTONE) 25 MG tablet TAKE 0.5 TABLETS (12.5 MG TOTAL) BY MOUTH DAILY. 15 tablet 2  . zolpidem (AMBIEN) 10 MG tablet Take 1 tablet (10 mg total) by mouth at bedtime as needed. for sleep 30 tablet 0  . nitroGLYCERIN (NITROSTAT) 0.4 MG SL tablet Place 1 tablet (0.4 mg total) under the tongue every 5 (five) minutes as needed for chest pain (CP or SOB). 20 tablet 0   No current facility-administered medications on file prior to visit.     Wt 201 lb (91.2 kg)   BMI 28.03 kg/m       Objective:   Physical Exam  Constitutional: He is oriented to person, place, and time. He appears well-developed and well-nourished. No distress.  HENT:  Head: Normocephalic and atraumatic.  Right Ear: External ear normal.  Left Ear: External ear normal.  Mouth/Throat: Oropharynx is clear and moist. No oropharyngeal exudate.  Eyes: Conjunctivae and EOM are normal. Pupils are equal, round, and reactive to light. Right eye exhibits no discharge. Left eye exhibits no discharge.  Neck: Normal range of motion. Neck supple. No JVD present. Carotid bruit  is not present. No tracheal deviation present. No thyromegaly present.  Cardiovascular: Normal rate, regular rhythm, normal heart sounds and intact distal pulses.  Exam reveals no gallop and no friction rub.   No murmur heard. Pulmonary/Chest: Effort normal and breath sounds normal. No stridor. No respiratory distress. He has no wheezes. He has no rales. He exhibits no tenderness.  Abdominal: Soft. Bowel sounds are normal. He exhibits no distension and no mass. There is no tenderness. There is no rebound and no guarding.  Genitourinary: Rectum normal, prostate normal and penis normal. Rectal exam shows no external hemorrhoid, no internal hemorrhoid and guaiac negative stool. Prostate is  not enlarged and not tender. No penile tenderness.  Musculoskeletal: Normal range of motion. He exhibits no edema or tenderness.  Lymphadenopathy:    He has no cervical adenopathy.  Neurological: He is alert and oriented to person, place, and time. He has normal reflexes. He displays normal reflexes. He exhibits normal muscle tone. Coordination normal.  Skin: Skin is warm and dry. No rash noted. He is not diaphoretic. No erythema. No pallor.  Psychiatric: He has a normal mood and affect. His behavior is normal. Judgment normal.      Assessment & Plan:  1. Routine general medical examination at a health care facility - Reviewed labs in detail with patient and all questions answered - Encouraged diabetic diet and frequent exercise - Follow up in one year for next CPE - Follow up sooner if needed - He does not want his flu vaccination at this time  2. Diabetes mellitus type 2, noninsulin dependent ([place]) - He has done a remarkable job in the last six months. A1c has gone from 11 to 7.7 in that time.  - Continue with diet and exercise - metFORMIN (GLUCOPHAGE XR) 500 MG 24 hr tablet; Take 2 tablets (1,000 mg total) by mouth daily with breakfast.  Dispense: 180 tablet; Refill: 3 - Follow up in 6 months  - Get  diabetic eye exam done   3. HTN (hypertension), malignant - Well controlled on current medication  - I would be hopeful that with continued weight loss we could start transitioning off Cozaar - Will continue to monitor  4. Diabetic mononeuropathy associated with type 2 diabetes mellitus (HCC) - gabapentin (NEURONTIN) 300 MG capsule; Take 1 capsule (300 mg total) by mouth 2 (two) times daily.  Dispense: 180 capsule; Refill: 3   Dorothyann Peng, NP

## 2015-10-30 ENCOUNTER — Other Ambulatory Visit: Payer: Self-pay | Admitting: Internal Medicine

## 2015-10-30 ENCOUNTER — Other Ambulatory Visit: Payer: Self-pay | Admitting: Adult Health

## 2015-10-30 NOTE — Telephone Encounter (Signed)
Ok to refill 

## 2015-10-30 NOTE — Telephone Encounter (Signed)
Ok to refill for one year  

## 2015-12-03 ENCOUNTER — Other Ambulatory Visit: Payer: Self-pay | Admitting: Internal Medicine

## 2015-12-04 ENCOUNTER — Other Ambulatory Visit (HOSPITAL_COMMUNITY): Payer: Self-pay | Admitting: *Deleted

## 2015-12-04 MED ORDER — CARVEDILOL 25 MG PO TABS: ORAL_TABLET | ORAL | 3 refills | Status: DC

## 2015-12-04 MED ORDER — CARVEDILOL 12.5 MG PO TABS: ORAL_TABLET | ORAL | 3 refills | Status: DC

## 2015-12-05 ENCOUNTER — Telehealth (HOSPITAL_COMMUNITY): Payer: Self-pay | Admitting: Vascular Surgery

## 2015-12-05 NOTE — Telephone Encounter (Signed)
Left pt message to make f/u appt 

## 2015-12-15 ENCOUNTER — Telehealth (HOSPITAL_COMMUNITY): Payer: Self-pay | Admitting: Vascular Surgery

## 2015-12-15 NOTE — Telephone Encounter (Signed)
Called pt to make f/u appt w/ DB, have not heard from pt will send letter

## 2015-12-18 ENCOUNTER — Other Ambulatory Visit: Payer: Self-pay | Admitting: Adult Health

## 2015-12-18 DIAGNOSIS — Z760 Encounter for issue of repeat prescription: Secondary | ICD-10-CM

## 2015-12-18 MED ORDER — ZOLPIDEM TARTRATE 10 MG PO TABS: 10.0000 mg | ORAL_TABLET | Freq: Every evening | ORAL | 0 refills | Status: DC | PRN

## 2015-12-26 ENCOUNTER — Other Ambulatory Visit: Payer: Self-pay | Admitting: Internal Medicine

## 2016-01-09 ENCOUNTER — Telehealth: Payer: Self-pay | Admitting: Adult Health

## 2016-01-09 ENCOUNTER — Other Ambulatory Visit: Payer: Self-pay

## 2016-01-09 MED ORDER — DULAGLUTIDE 1.5 MG/0.5ML ~~LOC~~ SOAJ: 1.5000 mg | SUBCUTANEOUS | 6 refills | Status: DC

## 2016-01-09 NOTE — Telephone Encounter (Signed)
Ok to refill for one year  

## 2016-01-09 NOTE — Telephone Encounter (Signed)
Ok to refill 

## 2016-01-09 NOTE — Telephone Encounter (Signed)
Rx refilled.

## 2016-01-09 NOTE — Telephone Encounter (Signed)
° ° ° ° °  Pt request refill of the following:   Dulaglutide (TRULICITY) 1.5 0000000 SOPN   Pham[facility]:  [facility] [address]

## 2016-02-20 ENCOUNTER — Other Ambulatory Visit: Payer: Self-pay

## 2016-02-20 ENCOUNTER — Telehealth: Payer: Self-pay | Admitting: Adult Health

## 2016-02-20 MED ORDER — GLIPIZIDE 10 MG PO TABS: ORAL_TABLET | ORAL | 1 refills | Status: DC

## 2016-02-20 NOTE — Telephone Encounter (Signed)
Rx has been refilled.  

## 2016-02-20 NOTE — Telephone Encounter (Signed)
° ° ° ° °  Pt request refill of the following:   glipiZIDE (GLUCOTROL) 10 MG tablet  Phamacy:  [facility] [address]

## 2016-03-10 ENCOUNTER — Encounter: Payer: Self-pay | Admitting: Adult Health

## 2016-03-10 ENCOUNTER — Other Ambulatory Visit: Payer: Self-pay | Admitting: Adult Health

## 2016-03-11 ENCOUNTER — Other Ambulatory Visit: Payer: Self-pay | Admitting: Adult Health

## 2016-03-11 DIAGNOSIS — E119 Type 2 diabetes mellitus without complications: Secondary | ICD-10-CM

## 2016-03-11 MED ORDER — METFORMIN HCL ER 500 MG PO TB24: 1000.0000 mg | ORAL_TABLET | Freq: Every day | ORAL | 3 refills | Status: DC

## 2016-03-12 ENCOUNTER — Other Ambulatory Visit: Payer: Self-pay | Admitting: Adult Health

## 2016-03-12 MED ORDER — METFORMIN HCL ER (OSM) 1000 MG PO TB24: 1000.0000 mg | ORAL_TABLET | Freq: Every day | ORAL | 3 refills | Status: DC

## 2016-03-16 ENCOUNTER — Telehealth: Payer: Self-pay

## 2016-03-16 ENCOUNTER — Telehealth: Payer: Self-pay | Admitting: Adult Health

## 2016-03-16 ENCOUNTER — Encounter: Payer: Self-pay | Admitting: Adult Health

## 2016-03-16 NOTE — Telephone Encounter (Signed)
Sherri is calling with the approval of  metformin er 1000 mg from 2-6 -18 with no expiration date as long as pt remain on current insurance plan

## 2016-03-16 NOTE — Telephone Encounter (Signed)
Received PA request from [facility] for Metformin HCL 1000 mg OSM tablets. PA submitted & is pending. Key: [id number]

## 2016-03-17 NOTE — Telephone Encounter (Signed)
PA approved, form faxed back to pharmacy. 

## 2016-03-25 LAB — HM DIABETES EYE EXAM

## 2016-04-08 ENCOUNTER — Other Ambulatory Visit: Payer: Self-pay | Admitting: Adult Health

## 2016-04-08 DIAGNOSIS — Z760 Encounter for issue of repeat prescription: Secondary | ICD-10-CM

## 2016-04-08 NOTE — Telephone Encounter (Signed)
Ok to refill for one month  

## 2016-04-23 ENCOUNTER — Other Ambulatory Visit: Payer: Self-pay | Admitting: Internal Medicine

## 2016-05-14 ENCOUNTER — Other Ambulatory Visit: Payer: Self-pay | Admitting: Adult Health

## 2016-05-14 ENCOUNTER — Telehealth: Payer: Self-pay | Admitting: Adult Health

## 2016-05-14 DIAGNOSIS — Z0000 Encounter for general adult medical examination without abnormal findings: Secondary | ICD-10-CM

## 2016-05-14 NOTE — Telephone Encounter (Signed)
Orders have been placed. Would you mind scheduled patient for lab visit?

## 2016-05-14 NOTE — Telephone Encounter (Signed)
Ok to place BMP, CBC with Diff, Lipid, Hepatic, A1c, PSA, and TSH

## 2016-05-14 NOTE — Telephone Encounter (Signed)
Are there specific lab orders you would like me to order for patient? Or would you like for patient to wait until office visit with you on 05/18/16? Please advise.

## 2016-05-14 NOTE — Telephone Encounter (Signed)
Pt state that he usually come in for labs before his follow-ups and pt is aware that I have to ask Tommi Rumps to place orders so that they can be drawn.  May I have lab orders is this is correct?

## 2016-05-17 ENCOUNTER — Other Ambulatory Visit (INDEPENDENT_AMBULATORY_CARE_PROVIDER_SITE_OTHER): Payer: BLUE CROSS/BLUE SHIELD

## 2016-05-17 DIAGNOSIS — Z0000 Encounter for general adult medical examination without abnormal findings: Secondary | ICD-10-CM | POA: Diagnosis not present

## 2016-05-17 LAB — BASIC METABOLIC PANEL
BUN: 17 mg/dL (ref 6–23)
CALCIUM: 9.4 mg/dL (ref 8.4–10.5)
CO2: 27 meq/L (ref 19–32)
CREATININE: 1.15 mg/dL (ref 0.40–1.50)
Chloride: 105 mEq/L (ref 96–112)
GFR: 70.54 mL/min (ref >60.00)
Glucose, Bld: 177 mg/dL — ABNORMAL HIGH (ref 70–99)
Potassium: 4.7 mEq/L (ref 3.5–5.1)
Sodium: 140 mEq/L (ref 135–145)

## 2016-05-17 LAB — CBC WITH DIFFERENTIAL/PLATELET
BASOS ABS: 0.1 10*3/uL (ref 0.0–0.1)
Basophils Relative: 1 % (ref 0.0–3.0)
EOS PCT: 10.1 % — AB (ref 0.0–5.0)
Eosinophils Absolute: 0.8 10*3/uL — ABNORMAL HIGH (ref 0.0–0.7)
HCT: 38.7 % — ABNORMAL LOW (ref 39.0–52.0)
Hemoglobin: 13.3 g/dL (ref 13.0–17.0)
LYMPHS ABS: 2.1 10*3/uL (ref 0.7–4.0)
Lymphocytes Relative: 25.9 % (ref 12.0–46.0)
MCHC: 34.4 g/dL (ref 30.0–36.0)
MCV: 85.7 fl (ref 78.0–100.0)
MONOS PCT: 8.9 % (ref 3.0–12.0)
Monocytes Absolute: 0.7 10*3/uL (ref 0.1–1.0)
NEUTROS PCT: 54.1 % (ref 43.0–77.0)
Neutro Abs: 4.3 10*3/uL (ref 1.4–7.7)
Platelets: 292 10*3/uL (ref 150.0–400.0)
RBC: 4.52 Mil/uL (ref 4.22–5.81)
RDW: 13.1 % (ref 11.5–15.5)
WBC: 8 10*3/uL (ref 4.0–10.5)

## 2016-05-17 LAB — HEPATIC FUNCTION PANEL
ALBUMIN: 4.1 g/dL (ref 3.5–5.2)
ALT: 38 U/L (ref 0–53)
AST: 30 U/L (ref 0–37)
Alkaline Phosphatase: 70 U/L (ref 39–117)
Bilirubin, Direct: 0.1 mg/dL (ref 0.0–0.3)
Total Bilirubin: 0.5 mg/dL (ref 0.2–1.2)
Total Protein: 6.8 g/dL (ref 6.0–8.3)

## 2016-05-17 LAB — LIPID PANEL
CHOLESTEROL: 156 mg/dL (ref 0–200)
HDL: 35.8 mg/dL — AB (ref >39.00)
LDL CALC: 90 mg/dL (ref 0–99)
NONHDL: 119.84
Total CHOL/HDL Ratio: 4
Triglycerides: 147 mg/dL (ref 0.0–149.0)
VLDL: 29.4 mg/dL (ref 0.0–40.0)

## 2016-05-17 LAB — PSA: PSA: 1.05 ng/mL (ref 0.10–4.00)

## 2016-05-17 LAB — HEMOGLOBIN A1C: HEMOGLOBIN A1C: 7.8 % — AB (ref 4.6–6.5)

## 2016-05-17 LAB — TSH: TSH: 1.41 u[IU]/mL (ref 0.35–4.50)

## 2016-05-17 NOTE — Telephone Encounter (Signed)
Pt has been scheduled.  °

## 2016-05-18 ENCOUNTER — Encounter: Payer: Self-pay | Admitting: Adult Health

## 2016-05-18 ENCOUNTER — Ambulatory Visit (INDEPENDENT_AMBULATORY_CARE_PROVIDER_SITE_OTHER): Payer: BLUE CROSS/BLUE SHIELD | Admitting: Adult Health

## 2016-05-18 VITALS — BP 140/92 | HR 94 | Temp 98.2°F | Wt 207.2 lb

## 2016-05-18 DIAGNOSIS — E1141 Type 2 diabetes mellitus with diabetic mononeuropathy: Secondary | ICD-10-CM | POA: Diagnosis not present

## 2016-05-18 DIAGNOSIS — I10 Essential (primary) hypertension: Secondary | ICD-10-CM

## 2016-05-18 DIAGNOSIS — E119 Type 2 diabetes mellitus without complications: Secondary | ICD-10-CM | POA: Diagnosis not present

## 2016-05-18 MED ORDER — METFORMIN HCL 1000 MG PO TABS: 1000.0000 mg | ORAL_TABLET | Freq: Two times a day (BID) | ORAL | 3 refills | Status: DC

## 2016-05-18 MED ORDER — LISINOPRIL 20 MG PO TABS: 20.0000 mg | ORAL_TABLET | Freq: Every day | ORAL | 3 refills | Status: DC

## 2016-05-18 MED ORDER — GABAPENTIN 300 MG PO CAPS: 300.0000 mg | ORAL_CAPSULE | Freq: Three times a day (TID) | ORAL | 0 refills | Status: DC

## 2016-05-18 MED ORDER — CARVEDILOL 12.5 MG PO TABS: ORAL_TABLET | ORAL | 3 refills | Status: DC

## 2016-05-18 MED ORDER — CARVEDILOL 25 MG PO TABS: ORAL_TABLET | ORAL | 3 refills | Status: DC

## 2016-05-18 NOTE — Progress Notes (Signed)
Pre visit review using our clinic review tool, if applicable. No additional management support is needed unless otherwise documented below in the visit note. 

## 2016-05-18 NOTE — Patient Instructions (Signed)
WE NOW OFFER   [facility] FAST TRACK!!!  SAME DAY Appointments for ACUTE CARE  Such as: Sprains, Injuries, cuts, abrasions, rashes, muscle pain, joint pain, back pain Colds, flu, sore throats, headache, allergies, cough, fever  Ear pain, sinus and eye infections Abdominal pain, nausea, vomiting, diarrhea, upset stomach Animal/insect bites  3 Easy Ways to Schedule: Walk-In Scheduling Call in scheduling Mychart Sign-up: [email or web address]         

## 2016-05-18 NOTE — Progress Notes (Signed)
Subjective:    Patient ID: Dustin Lozano, male    DOB: Jul 08, 1960, 56 y.o.   MRN: [id number]  HPI  56 year old male who presents to the clinic today for six month follow up regarding diabetes and hypertension. His latest A1c is 7.8. He is currently taking Trulicity 1.5 mg and Metformin 500mg  BID. He reports diarrhea when taking Metformin. He had been on Metformin ER but insurance would no longer cover this.   His blood pressure today is 140/92. He is no longer taking Losartan because Cardiology wanted him to come into the office for a visit but he cannot afford it right now.   He reports trying to eat a diabetic diet and is exercising.   Wt Readings from Last 3 Encounters:  05/18/16 207 lb 3.2 oz (94 kg)  10/22/15 201 lb (91.2 kg)  06/18/15 151 lb (68.5 kg)    He continues with the [employer] program   Reports continued numbness and tingling in his lower extremities. Gabapentin has helped with this.    Review of Systems See HPI   Past Medical History:  Diagnosis Date  . CHF (congestive heart failure) (HCC)    Systolic - LVEF 53-61  (44/3154)   . Coronary artery disease    Nonobstructive disease per cath 08/2011  . Diabetes mellitus   . HTN (hypertension)     Social History   Social History  . Marital status: Single    Spouse name: N/A  . Number of children: N/A  . Years of education: N/A   Occupational History  . salesman    Social History Main Topics  . Smoking status: Former Smoker    Packs/day: 0.70    Years: 15.00    Quit date: 02/08/2006  . Smokeless tobacco: Never Used  . Alcohol use Yes     Comment: rare  . Drug use: No  . Sexual activity: Not on file   Other Topics Concern  . Not on file   Social History Narrative  . No narrative on file    Past Surgical History:  Procedure Laterality Date  . APPENDECTOMY    . LEFT AND RIGHT HEART CATHETERIZATION WITH CORONARY ANGIOGRAM N/A 08/17/2011   Procedure: LEFT AND RIGHT HEART CATHETERIZATION WITH  CORONARY ANGIOGRAM;  Surgeon: Sherren Mocha, MD;  Location: [facility] CATH LAB;  Service: Cardiovascular;  Laterality: N/A;    Family History  Problem Relation Age of Onset  . Diabetes Father   . Heart failure Father   . Cancer Mother     lymphoma  . Diabetes Mother   . Colon cancer Neg Hx   . Esophageal cancer Neg Hx   . Rectal cancer Neg Hx   . Stomach cancer Neg Hx     No Known Allergies  Current Outpatient Prescriptions on File Prior to Visit  Medication Sig Dispense Refill  . aspirin EC 81 MG tablet Take 81 mg by mouth daily.    . carvedilol (COREG) 12.5 MG tablet Take one tablet daily in the AM. 90 tablet 3  . carvedilol (COREG) 25 MG tablet Take 1 tablet daily in the PM 90 tablet 3  . clotrimazole-betamethasone (LOTRISONE) cream Apply 1 application topically 2 (two) times daily. 30 g 0  . Dulaglutide (TRULICITY) 1.5 MG/8.6PY SOPN Inject 1.5 mg into the skin once a week. 4 pen 6  . FREESTYLE LITE test strip USE ONE STRIP TO CHECK GLUCOSE TWICE DAILY. 100 each 2  . gabapentin (NEURONTIN) 300 MG capsule Take  1 capsule (300 mg total) by mouth 2 (two) times daily. 180 capsule 3  . gemfibrozil (LOPID) 600 MG tablet TAKE ONE TABLET BY MOUTH TWICE DAILY BEFORE  A  MEAL. 180 tablet 3  . glipiZIDE (GLUCOTROL) 10 MG tablet TAKE ONE TABLET BY MOUTH TWICE DAILY BEFORE  A  MEAL 90 tablet 1  . Insulin Pen Needle (BD PEN NEEDLE NANO U/F) 32G X 4 MM MISC Test blood sugars as directed. 30 each 0  . Lancets (FREESTYLE) lancets Use as instructed 100 each 12  . metformin (FORTAMET) 1000 MG (OSM) 24 hr tablet Take 1 tablet (1,000 mg total) by mouth daily with breakfast. 30 tablet 3  . Multiple Vitamin (MULTIVITAMIN WITH MINERALS) TABS Take 1 tablet by mouth daily.    Marland Kitchen zolpidem (AMBIEN) 10 MG tablet TAKE ONE TABLET BY MOUTH EVERY NIGHT AT BEDTIME AS NEEDED FOR SLEEP 30 tablet 0  . nitroGLYCERIN (NITROSTAT) 0.4 MG SL tablet Place 1 tablet (0.4 mg total) under the tongue every 5 (five) minutes as needed  for chest pain (CP or SOB). 20 tablet 0  . spironolactone (ALDACTONE) 25 MG tablet TAKE 0.5 TABLETS (12.5 MG TOTAL) BY MOUTH DAILY. (Patient not taking: Reported on 05/18/2016) 15 tablet 3   No current facility-administered medications on file prior to visit.     BP (!) 140/92 (BP Location: Left Arm, Patient Position: Sitting, Cuff Size: Normal)   Pulse 94   Temp 98.2 F (36.8 C) (Oral)   Wt 207 lb 3.2 oz (94 kg)   SpO2 98%   BMI 28.90 kg/m       Objective:   Physical Exam  Constitutional: He is oriented to person, place, and time. He appears well-developed and well-nourished. No distress.  Cardiovascular: Normal rate, regular rhythm, normal heart sounds and intact distal pulses.  Exam reveals no gallop and no friction rub.   No murmur heard. Pulmonary/Chest: Effort normal and breath sounds normal. No respiratory distress. He has no wheezes. He has no rales. He exhibits no tenderness.  Neurological: He is alert and oriented to person, place, and time.  Skin: Skin is warm and dry. No rash noted. He is not diaphoretic. No erythema. No pallor.  Psychiatric: He has a normal mood and affect. His behavior is normal. Judgment and thought content normal.  Vitals reviewed.     Assessment & Plan:  1. Diabetes mellitus type 2, noninsulin dependent ([place]) - Reviewed labs with patient.  Lab Results  Component Value Date   HGBA1C 7.8 (H) 05/17/2016   - metFORMIN (GLUCOPHAGE) 1000 MG tablet; Take 1 tablet (1,000 mg total) by mouth 2 (two) times daily with a meal.  Dispense: 180 tablet; Refill: 3 - Follow up in 3 months  - Follow up sooner if needed - Follow a diabetic diet and exercise on a regular basis   2. Diabetic mononeuropathy associated with type 2 diabetes mellitus (HCC) - Will increase from BID to TID  - gabapentin (NEURONTIN) 300 MG capsule; Take 1 capsule (300 mg total) by mouth 3 (three) times daily.  Dispense: 270 capsule; Refill: 0  3. HTN (hypertension), malignant  -  carvedilol (COREG) 12.5 MG tablet; Take one tablet daily in the AM.  Dispense: 90 tablet; Refill: 3 - carvedilol (COREG) 25 MG tablet; Take 1 tablet daily in the PM  Dispense: 90 tablet; Refill: 3 - lisinopril (PRINIVIL,ZESTRIL) 20 MG tablet; Take 1 tablet (20 mg total) by mouth daily.  Dispense: 90 tablet; Refill: 3 - Will follow up  with in 3 months   Dorothyann Peng, NP

## 2016-05-24 ENCOUNTER — Telehealth: Payer: Self-pay | Admitting: Adult Health

## 2016-05-24 MED ORDER — GLIPIZIDE 10 MG PO TABS: ORAL_TABLET | ORAL | 2 refills | Status: DC

## 2016-05-24 NOTE — Telephone Encounter (Signed)
Patient needs a refill called in for Glipizide called in to Kristopher Oppen[facility] at [facility]

## 2016-05-24 NOTE — Telephone Encounter (Signed)
Rx sent 

## 2016-05-25 ENCOUNTER — Encounter: Payer: Self-pay | Admitting: Family Medicine

## 2016-06-17 ENCOUNTER — Encounter: Payer: Self-pay | Admitting: Adult Health

## 2016-07-26 ENCOUNTER — Other Ambulatory Visit: Payer: Self-pay | Admitting: Adult Health

## 2016-07-26 MED ORDER — DULAGLUTIDE 1.5 MG/0.5ML ~~LOC~~ SOAJ: 1.5000 mg | SUBCUTANEOUS | 3 refills | Status: DC

## 2016-07-26 NOTE — Telephone Encounter (Signed)
Pt need new Rx for TRULICITY (dulaglutide) Pharm:  [facility]

## 2016-07-26 NOTE — Telephone Encounter (Signed)
Rx sent to pharmacy   

## 2016-08-18 ENCOUNTER — Encounter: Payer: Self-pay | Admitting: Adult Health

## 2016-08-18 ENCOUNTER — Ambulatory Visit (INDEPENDENT_AMBULATORY_CARE_PROVIDER_SITE_OTHER): Payer: BLUE CROSS/BLUE SHIELD | Admitting: Adult Health

## 2016-08-18 VITALS — BP 96/60 | HR 95 | Temp 98.0°F | Ht 71.0 in | Wt 205.2 lb

## 2016-08-18 DIAGNOSIS — Z760 Encounter for issue of repeat prescription: Secondary | ICD-10-CM

## 2016-08-18 DIAGNOSIS — E119 Type 2 diabetes mellitus without complications: Secondary | ICD-10-CM | POA: Diagnosis not present

## 2016-08-18 DIAGNOSIS — I10 Essential (primary) hypertension: Secondary | ICD-10-CM | POA: Diagnosis not present

## 2016-08-18 LAB — GLUCOSE, POCT (MANUAL RESULT ENTRY): POC Glucose: 152 mg/dl — AB (ref 70–99)

## 2016-08-18 LAB — POCT GLYCOSYLATED HEMOGLOBIN (HGB A1C): Hemoglobin A1C: 8.2

## 2016-08-18 MED ORDER — ZOLPIDEM TARTRATE 10 MG PO TABS: ORAL_TABLET | ORAL | 0 refills | Status: DC

## 2016-08-18 MED ORDER — ACCU-CHEK FASTCLIX LANCETS MISC: 1.0000 | Freq: Two times a day (BID) | 12 refills | Status: AC

## 2016-08-18 MED ORDER — GLUCOSE BLOOD VI STRP: ORAL_STRIP | 12 refills | Status: DC

## 2016-08-18 NOTE — Progress Notes (Signed)
Subjective:    Patient ID: Dustin Lozano, male    DOB: 04-08-1960, 56 y.o.   MRN: [id number]  HPI  56 year old male who  has a past medical history of CHF (congestive heart failure) ([place]); Coronary artery disease; Diabetes mellitus; and HTN (hypertension). He presents to the office today for three month follow up regarding diabetes. I last saw him in April 2018 at which time he was maintained on Trulicity 1.5 mg weekly, Glipizide 10 mg, and Metformin 1000mg  XR. His last A1c was 7.8. He has been slowly coming down from 12.   Today in the office he reports that his [place] study has come to an end and he has run out of testing supplies. He has not checked his blood sugar in 2 months.   His A1c today is 8.2   BP Readings from Last 3 Encounters:  08/18/16 96/60  05/18/16 (!) 140/92  06/18/15 120/70   Wt Readings from Last 3 Encounters:  08/18/16 205 lb 3.2 oz (93.1 kg)  05/18/16 207 lb 3.2 oz (94 kg)  10/22/15 201 lb (91.2 kg)   He needs a refill of Ambien   Review of Systems See HPI   Past Medical History:  Diagnosis Date  . CHF (congestive heart failure) (HCC)    Systolic - LVEF 79-48  (02/6551)   . Coronary artery disease    Nonobstructive disease per cath 08/2011  . Diabetes mellitus   . HTN (hypertension)     Social History   Social History  . Marital status: Single    Spouse name: N/A  . Number of children: N/A  . Years of education: N/A   Occupational History  . salesman    Social History Main Topics  . Smoking status: Former Smoker    Packs/day: 0.70    Years: 15.00    Quit date: 02/08/2006  . Smokeless tobacco: Never Used  . Alcohol use Yes     Comment: rare  . Drug use: No  . Sexual activity: Not on file   Other Topics Concern  . Not on file   Social History Narrative  . No narrative on file    Past Surgical History:  Procedure Laterality Date  . APPENDECTOMY    . LEFT AND RIGHT HEART CATHETERIZATION WITH CORONARY ANGIOGRAM N/A 08/17/2011   Procedure: LEFT AND RIGHT HEART CATHETERIZATION WITH CORONARY ANGIOGRAM;  Surgeon: Sherren Mocha, MD;  Location: [facility] CATH LAB;  Service: Cardiovascular;  Laterality: N/A;    Family History  Problem Relation Age of Onset  . Diabetes Father   . Heart failure Father   . Cancer Mother        lymphoma  . Diabetes Mother   . Colon cancer Neg Hx   . Esophageal cancer Neg Hx   . Rectal cancer Neg Hx   . Stomach cancer Neg Hx     No Known Allergies  Current Outpatient Prescriptions on File Prior to Visit  Medication Sig Dispense Refill  . aspirin EC 81 MG tablet Take 81 mg by mouth daily.    . carvedilol (COREG) 12.5 MG tablet Take one tablet daily in the AM. 90 tablet 3  . carvedilol (COREG) 25 MG tablet Take 1 tablet daily in the PM 90 tablet 3  . clotrimazole-betamethasone (LOTRISONE) cream Apply 1 application topically 2 (two) times daily. 30 g 0  . Dulaglutide (TRULICITY) 1.5 ZS/8.2LM SOPN Inject 1.5 mg into the skin once a week. 4 pen 3  . FREESTYLE  LITE test strip USE ONE STRIP TO CHECK GLUCOSE TWICE DAILY. 100 each 2  . gemfibrozil (LOPID) 600 MG tablet TAKE ONE TABLET BY MOUTH TWICE DAILY BEFORE  A  MEAL. 180 tablet 3  . glipiZIDE (GLUCOTROL) 10 MG tablet TAKE ONE TABLET BY MOUTH TWICE DAILY BEFORE  A  MEAL 90 tablet 2  . Insulin Pen Needle (BD PEN NEEDLE NANO U/F) 32G X 4 MM MISC Test blood sugars as directed. 30 each 0  . Lancets (FREESTYLE) lancets Use as instructed 100 each 12  . lisinopril (PRINIVIL,ZESTRIL) 20 MG tablet Take 1 tablet (20 mg total) by mouth daily. 90 tablet 3  . metFORMIN (GLUCOPHAGE) 1000 MG tablet Take 1 tablet (1,000 mg total) by mouth 2 (two) times daily with a meal. 180 tablet 3  . Multiple Vitamin (MULTIVITAMIN WITH MINERALS) TABS Take 1 tablet by mouth daily.    Marland Kitchen spironolactone (ALDACTONE) 25 MG tablet TAKE 0.5 TABLETS (12.5 MG TOTAL) BY MOUTH DAILY. 15 tablet 3  . zolpidem (AMBIEN) 10 MG tablet TAKE ONE TABLET BY MOUTH EVERY NIGHT AT BEDTIME AS NEEDED  FOR SLEEP 30 tablet 0  . gabapentin (NEURONTIN) 300 MG capsule Take 1 capsule (300 mg total) by mouth 3 (three) times daily. 270 capsule 0  . nitroGLYCERIN (NITROSTAT) 0.4 MG SL tablet Place 1 tablet (0.4 mg total) under the tongue every 5 (five) minutes as needed for chest pain (CP or SOB). 20 tablet 0   No current facility-administered medications on file prior to visit.     BP 96/60   Pulse 95   Temp 98 F (36.7 C) (Oral)   Ht 5\' 11"  (1.803 m)   Wt 205 lb 3.2 oz (93.1 kg)   BMI 28.62 kg/m       Objective:   Physical Exam  Constitutional: He is oriented to person, place, and time. He appears well-developed and well-nourished. No distress.  Cardiovascular: Normal rate, regular rhythm, normal heart sounds and intact distal pulses.  Exam reveals no gallop and no friction rub.   No murmur heard. Pulmonary/Chest: Effort normal and breath sounds normal. No respiratory distress. He has no wheezes. He has no rales. He exhibits no tenderness.  Neurological: He is alert and oriented to person, place, and time.  Skin: Skin is warm and dry. No rash noted. He is not diaphoretic. No erythema. No pallor.  Psychiatric: He has a normal mood and affect. His behavior is normal. Judgment and thought content normal.  Nursing note and vitals reviewed.     Assessment & Plan:  1. Diabetes mellitus type 2, noninsulin dependent (HCC) - POC HgB A1c - 8.2. Has increased. Glucometer given in the office and test strips ordered.  - Follow up in 3 months. No medication changes at this time  - glucose blood (ACCU-CHEK GUIDE) test strip; Use as instructed to check blood sugar twice a day  Dispense: 100 each; Refill: 12 - ACCU-CHEK FASTCLIX LANCETS MISC; 1 Device by Does not apply route 2 (two) times daily.  Dispense: 100 each; Refill: 12 - POC Glucose (CBG)  2. Medication refill  - zolpidem (AMBIEN) 10 MG tablet; TAKE ONE TABLET BY MOUTH EVERY NIGHT AT BEDTIME AS NEEDED FOR SLEEP  Dispense: 30 tablet;  Refill: 0  3. HTN (hypertension), malignant - BP in the office today 96/60 in the office today  - Going to have him take 12.5 mg Coreg in the am and pm - Monitor BP at home    Dorothyann Peng, NP

## 2016-09-08 ENCOUNTER — Encounter: Payer: Self-pay | Admitting: Adult Health

## 2016-09-08 ENCOUNTER — Ambulatory Visit (INDEPENDENT_AMBULATORY_CARE_PROVIDER_SITE_OTHER): Payer: BLUE CROSS/BLUE SHIELD | Admitting: Adult Health

## 2016-09-08 VITALS — BP 100/68 | Temp 98.2°F | Wt 201.0 lb

## 2016-09-08 DIAGNOSIS — B029 Zoster without complications: Secondary | ICD-10-CM | POA: Diagnosis not present

## 2016-09-08 MED ORDER — OXYCODONE-ACETAMINOPHEN 5-325 MG PO TABS
1.0000 | ORAL_TABLET | Freq: Four times a day (QID) | ORAL | 0 refills | Status: AC | PRN
Start: 2016-09-08 — End: 2016-09-11

## 2016-09-08 MED ORDER — VALACYCLOVIR HCL 1 G PO TABS: 1000.0000 mg | ORAL_TABLET | Freq: Three times a day (TID) | ORAL | 0 refills | Status: AC

## 2016-09-08 NOTE — Progress Notes (Signed)
Subjective:    Patient ID: Dustin Lozano, male    DOB: 28-Dec-1960, 56 y.o.   MRN: [id number]  HPI  56 year old male who  has a past medical history of CHF (congestive heart failure) ([place]); Coronary artery disease; Diabetes mellitus; and HTN (hypertension). He presents to the office today for an acute complaint of rash on torso. He first noticed this rash about 3 days ago. Endorses pain along left flank   Review of Systems See HPI   Past Medical History:  Diagnosis Date  . CHF (congestive heart failure) (HCC)    Systolic - LVEF 40-81  (44/8185)   . Coronary artery disease    Nonobstructive disease per cath 08/2011  . Diabetes mellitus   . HTN (hypertension)     Social History   Social History  . Marital status: Single    Spouse name: N/A  . Number of children: N/A  . Years of education: N/A   Occupational History  . salesman    Social History Main Topics  . Smoking status: Former Smoker    Packs/day: 0.70    Years: 15.00    Quit date: 02/08/2006  . Smokeless tobacco: Never Used  . Alcohol use Yes     Comment: rare  . Drug use: No  . Sexual activity: Not on file   Other Topics Concern  . Not on file   Social History Narrative  . No narrative on file    Past Surgical History:  Procedure Laterality Date  . APPENDECTOMY    . LEFT AND RIGHT HEART CATHETERIZATION WITH CORONARY ANGIOGRAM N/A 08/17/2011   Procedure: LEFT AND RIGHT HEART CATHETERIZATION WITH CORONARY ANGIOGRAM;  Surgeon: Sherren Mocha, MD;  Location: [facility] CATH LAB;  Service: Cardiovascular;  Laterality: N/A;    Family History  Problem Relation Age of Onset  . Diabetes Father   . Heart failure Father   . Cancer Mother        lymphoma  . Diabetes Mother   . Colon cancer Neg Hx   . Esophageal cancer Neg Hx   . Rectal cancer Neg Hx   . Stomach cancer Neg Hx     No Known Allergies  Current Outpatient Prescriptions on File Prior to Visit  Medication Sig Dispense Refill  . ACCU-CHEK FASTCLIX  LANCETS MISC 1 Device by Does not apply route 2 (two) times daily. 100 each 12  . aspirin EC 81 MG tablet Take 81 mg by mouth daily.    . carvedilol (COREG) 12.5 MG tablet Take one tablet daily in the AM. (Patient taking differently: Take 12.5 mg by mouth 2 (two) times daily with a meal. Take one tablet daily in the AM.) 90 tablet 3  . clotrimazole-betamethasone (LOTRISONE) cream Apply 1 application topically 2 (two) times daily. 30 g 0  . Dulaglutide (TRULICITY) 1.5 UD/1.4HF SOPN Inject 1.5 mg into the skin once a week. 4 pen 3  . FREESTYLE LITE test strip USE ONE STRIP TO CHECK GLUCOSE TWICE DAILY. 100 each 2  . gemfibrozil (LOPID) 600 MG tablet TAKE ONE TABLET BY MOUTH TWICE DAILY BEFORE  A  MEAL. 180 tablet 3  . glipiZIDE (GLUCOTROL) 10 MG tablet TAKE ONE TABLET BY MOUTH TWICE DAILY BEFORE  A  MEAL 90 tablet 2  . glucose blood (ACCU-CHEK GUIDE) test strip Use as instructed to check blood sugar twice a day 100 each 12  . Insulin Pen Needle (BD PEN NEEDLE NANO U/F) 32G X 4 MM MISC Test  blood sugars as directed. 30 each 0  . Lancets (FREESTYLE) lancets Use as instructed 100 each 12  . lisinopril (PRINIVIL,ZESTRIL) 20 MG tablet Take 1 tablet (20 mg total) by mouth daily. 90 tablet 3  . metFORMIN (GLUCOPHAGE) 1000 MG tablet Take 1 tablet (1,000 mg total) by mouth 2 (two) times daily with a meal. 180 tablet 3  . Multiple Vitamin (MULTIVITAMIN WITH MINERALS) TABS Take 1 tablet by mouth daily.    Marland Kitchen spironolactone (ALDACTONE) 25 MG tablet TAKE 0.5 TABLETS (12.5 MG TOTAL) BY MOUTH DAILY. 15 tablet 3  . zolpidem (AMBIEN) 10 MG tablet TAKE ONE TABLET BY MOUTH EVERY NIGHT AT BEDTIME AS NEEDED FOR SLEEP 30 tablet 0  . gabapentin (NEURONTIN) 300 MG capsule Take 1 capsule (300 mg total) by mouth 3 (three) times daily. 270 capsule 0  . nitroGLYCERIN (NITROSTAT) 0.4 MG SL tablet Place 1 tablet (0.4 mg total) under the tongue every 5 (five) minutes as needed for chest pain (CP or SOB). 20 tablet 0   No current  facility-administered medications on file prior to visit.     BP 100/68   Temp 98.2 F (36.8 C) (Oral)   Wt 201 lb (91.2 kg)   BMI 28.03 kg/m       Objective:   Physical Exam  Constitutional: He is oriented to person, place, and time. He appears well-developed and well-nourished. No distress.  Cardiovascular: Normal rate, regular rhythm, normal heart sounds and intact distal pulses.  Exam reveals no gallop and no friction rub.   No murmur heard. Pulmonary/Chest: Breath sounds normal. No respiratory distress. He has no wheezes. He has no rales. He exhibits no tenderness.  Neurological: He is alert and oriented to person, place, and time.  Skin: Skin is warm and dry. Rash (red vasicular rash along T4-5 dematome ) noted. There is erythema.  Nursing note and vitals reviewed.     Assessment & Plan:  1. Herpes zoster without complication -Rash consistent with Shingles. Will treat with valtrex and give short course of percocet for breakthrough pain  - valACYclovir (VALTREX) 1000 MG tablet; Take 1 tablet (1,000 mg total) by mouth 3 (three) times daily.  Dispense: 20 tablet; Refill: 0 - oxyCODONE-acetaminophen (ROXICET) 5-325 MG tablet; Take 1 tablet by mouth every 6 (six) hours as needed for severe pain.  Dispense: 10 tablet; Refill: 0 - Follow up as needed  Dorothyann Peng, NP

## 2016-10-08 ENCOUNTER — Telehealth: Payer: Self-pay | Admitting: Adult Health

## 2016-10-08 MED ORDER — GLIPIZIDE 10 MG PO TABS: ORAL_TABLET | ORAL | 1 refills | Status: DC

## 2016-10-08 NOTE — Telephone Encounter (Signed)
Pt need new Rx for glipizide   Pharm:  [facility] [place]

## 2016-10-08 NOTE — Telephone Encounter (Signed)
Sent to the pharmacy by e-scribe for 6 months.  Pt has upcoming appt on 11/17/16 with [facility].

## 2016-10-29 ENCOUNTER — Telehealth: Payer: Self-pay | Admitting: Adult Health

## 2016-10-29 ENCOUNTER — Encounter: Payer: Self-pay | Admitting: Adult Health

## 2016-10-29 DIAGNOSIS — E1141 Type 2 diabetes mellitus with diabetic mononeuropathy: Secondary | ICD-10-CM

## 2016-10-29 NOTE — Telephone Encounter (Signed)
Pt needs refills on  Gabapentin and gemfibrozil 600 mg #90 w/refills send to [facility] on [address]

## 2016-11-03 MED ORDER — GEMFIBROZIL 600 MG PO TABS: ORAL_TABLET | ORAL | 1 refills | Status: DC

## 2016-11-03 MED ORDER — GABAPENTIN 300 MG PO CAPS: 300.0000 mg | ORAL_CAPSULE | Freq: Three times a day (TID) | ORAL | 1 refills | Status: DC

## 2016-11-03 NOTE — Telephone Encounter (Signed)
Sent to the pharmacy by e-scribe. 

## 2016-11-09 ENCOUNTER — Other Ambulatory Visit: Payer: Self-pay | Admitting: Adult Health

## 2016-11-09 DIAGNOSIS — I10 Essential (primary) hypertension: Secondary | ICD-10-CM

## 2016-11-09 MED ORDER — CARVEDILOL 12.5 MG PO TABS: 12.5000 mg | ORAL_TABLET | Freq: Two times a day (BID) | ORAL | 0 refills | Status: DC

## 2016-11-09 NOTE — Telephone Encounter (Signed)
Sent to the pharmacy by e-scribe. 

## 2016-11-09 NOTE — Telephone Encounter (Signed)
Pt need new Rx for carvedilol 12.5 MG bid  #180   Pharm:  [facility]

## 2016-11-10 ENCOUNTER — Other Ambulatory Visit: Payer: Self-pay | Admitting: Adult Health

## 2016-11-10 DIAGNOSIS — I10 Essential (primary) hypertension: Secondary | ICD-10-CM

## 2016-11-10 MED ORDER — CARVEDILOL 12.5 MG PO TABS: 12.5000 mg | ORAL_TABLET | Freq: Two times a day (BID) | ORAL | 1 refills | Status: DC

## 2016-11-12 ENCOUNTER — Other Ambulatory Visit: Payer: Self-pay | Admitting: Adult Health

## 2016-11-12 NOTE — Telephone Encounter (Signed)
Sent to the pharmacy by e-scribe. 

## 2016-11-17 ENCOUNTER — Ambulatory Visit (INDEPENDENT_AMBULATORY_CARE_PROVIDER_SITE_OTHER): Payer: BLUE CROSS/BLUE SHIELD | Admitting: Adult Health

## 2016-11-17 ENCOUNTER — Encounter: Payer: Self-pay | Admitting: Adult Health

## 2016-11-17 VITALS — BP 94/60 | Temp 98.1°F | Wt 202.0 lb

## 2016-11-17 DIAGNOSIS — R5383 Other fatigue: Secondary | ICD-10-CM

## 2016-11-17 DIAGNOSIS — E119 Type 2 diabetes mellitus without complications: Secondary | ICD-10-CM

## 2016-11-17 LAB — POCT GLYCOSYLATED HEMOGLOBIN (HGB A1C): Hemoglobin A1C: 5.9

## 2016-11-17 NOTE — Progress Notes (Signed)
Subjective:    Patient ID: Dustin Lozano, male    DOB: Jun 09, 1960, 56 y.o.   MRN: [id number]  HPI  56 year old male who  has a past medical history of CHF (congestive heart failure) ([place]); Coronary artery disease; Diabetes mellitus; and HTN (hypertension).  He presents to the clinic today for three month follow up regarding diabetes mellitus. His current regimen includes that of Trulicity 1.5 mg weekly, Metformin 1000 mg BID, and glipizide 10 mg BID. His last A1c was 8.2 on 08/18/2016. His   He reports that he is back in the [place] program for diabetes. He is eating less portions and is drinking less alcohol.   His biggest complaint is that of fatigue and episodes of dizziness   BP Readings from Last 3 Encounters:  11/17/16 94/60  09/08/16 100/68  08/18/16 96/60   Wt Readings from Last 3 Encounters:  11/17/16 202 lb (91.6 kg)  09/08/16 201 lb (91.2 kg)  08/18/16 205 lb 3.2 oz (93.1 kg)     Review of Systems See HPI   Past Medical History:  Diagnosis Date  . CHF (congestive heart failure) (HCC)    Systolic - LVEF 68-34  (19/6222)   . Coronary artery disease    Nonobstructive disease per cath 08/2011  . Diabetes mellitus   . HTN (hypertension)     Social History   Social History  . Marital status: Single    Spouse name: N/A  . Number of children: N/A  . Years of education: N/A   Occupational History  . salesman    Social History Main Topics  . Smoking status: Former Smoker    Packs/day: 0.70    Years: 15.00    Quit date: 02/08/2006  . Smokeless tobacco: Never Used  . Alcohol use Yes     Comment: rare  . Drug use: No  . Sexual activity: Not on file   Other Topics Concern  . Not on file   Social History Narrative  . No narrative on file    Past Surgical History:  Procedure Laterality Date  . APPENDECTOMY    . LEFT AND RIGHT HEART CATHETERIZATION WITH CORONARY ANGIOGRAM N/A 08/17/2011   Procedure: LEFT AND RIGHT HEART CATHETERIZATION WITH CORONARY  ANGIOGRAM;  Surgeon: Sherren Mocha, MD;  Location: [facility] CATH LAB;  Service: Cardiovascular;  Laterality: N/A;    Family History  Problem Relation Age of Onset  . Diabetes Father   . Heart failure Father   . Cancer Mother        lymphoma  . Diabetes Mother   . Colon cancer Neg Hx   . Esophageal cancer Neg Hx   . Rectal cancer Neg Hx   . Stomach cancer Neg Hx     No Known Allergies  Current Outpatient Prescriptions on File Prior to Visit  Medication Sig Dispense Refill  . aspirin EC 81 MG tablet Take 81 mg by mouth daily.    . carvedilol (COREG) 12.5 MG tablet Take 1 tablet (12.5 mg total) by mouth 2 (two) times daily with a meal. Take one tablet daily in the AM. 180 tablet 1  . clotrimazole-betamethasone (LOTRISONE) cream Apply 1 application topically 2 (two) times daily. 30 g 0  . FREESTYLE LITE test strip USE ONE STRIP TO CHECK GLUCOSE TWICE DAILY. 100 each 2  . gabapentin (NEURONTIN) 300 MG capsule Take 1 capsule (300 mg total) by mouth 3 (three) times daily. 270 capsule 1  . gemfibrozil (LOPID) 600 MG tablet  TAKE ONE TABLET BY MOUTH TWICE DAILY BEFORE  A  MEAL. 180 tablet 1  . glipiZIDE (GLUCOTROL) 10 MG tablet TAKE ONE TABLET BY MOUTH TWICE DAILY BEFORE  A  MEAL 180 tablet 1  . Insulin Pen Needle (BD PEN NEEDLE NANO U/F) 32G X 4 MM MISC Test blood sugars as directed. 30 each 0  . lisinopril (PRINIVIL,ZESTRIL) 20 MG tablet Take 1 tablet (20 mg total) by mouth daily. 90 tablet 3  . metFORMIN (GLUCOPHAGE) 1000 MG tablet Take 1 tablet (1,000 mg total) by mouth 2 (two) times daily with a meal. 180 tablet 3  . Multiple Vitamin (MULTIVITAMIN WITH MINERALS) TABS Take 1 tablet by mouth daily.    Marland Kitchen spironolactone (ALDACTONE) 25 MG tablet TAKE 0.5 TABLETS (12.5 MG TOTAL) BY MOUTH DAILY. 15 tablet 3  . zolpidem (AMBIEN) 10 MG tablet TAKE ONE TABLET BY MOUTH EVERY NIGHT AT BEDTIME AS NEEDED FOR SLEEP 30 tablet 0  . ACCU-CHEK FASTCLIX LANCETS MISC 1 Device by Does not apply route 2 (two) times  daily. 100 each 12   No current facility-administered medications on file prior to visit.     BP 94/60 (BP Location: Left Arm)   Temp 98.1 F (36.7 C) (Oral)   Wt 202 lb (91.6 kg)   BMI 28.17 kg/m       Objective:   Physical Exam  Constitutional: He is oriented to person, place, and time. He appears well-developed and well-nourished. No distress.  Cardiovascular: Normal rate, regular rhythm, normal heart sounds and intact distal pulses.  Exam reveals no gallop and no friction rub.   No murmur heard. Pulmonary/Chest: Effort normal and breath sounds normal. No respiratory distress. He has no wheezes. He has no rales. He exhibits no tenderness.  Neurological: He is alert and oriented to person, place, and time.  Skin: Skin is warm and dry. No rash noted. He is not diaphoretic. No erythema. No pallor.  Psychiatric: He has a normal mood and affect. His behavior is normal. Judgment and thought content normal.  Nursing note and vitals reviewed.     Assessment & Plan:  1. Diabetes mellitus type 2, noninsulin dependent (HCC) - POCT A1C- 5.9  - Continue with current medication regimens - Follow up in 3 months or sooner if needed - consider decreasing dose of glipizide   2. Other fatigue - possibly related to hypotension.  - Cut back to once daily coreg 12.5 mg   Dorothyann Peng, AGNP

## 2017-05-11 ENCOUNTER — Emergency Department (HOSPITAL_COMMUNITY): Payer: Medicaid Other

## 2017-05-11 ENCOUNTER — Emergency Department (HOSPITAL_COMMUNITY)
Admission: EM | Admit: 2017-05-11 | Discharge: 2017-05-11 | Disposition: A | Discharge status: Other Acute Inpatient Hospital | Payer: Medicaid Other | Attending: Emergency Medicine | Admitting: Emergency Medicine

## 2017-05-11 ENCOUNTER — Encounter (HOSPITAL_COMMUNITY): Payer: Self-pay

## 2017-05-11 DIAGNOSIS — T25022A Burn of unspecified degree of left foot, initial encounter: Secondary | ICD-10-CM

## 2017-05-11 DIAGNOSIS — I5022 Chronic systolic (congestive) heart failure: Secondary | ICD-10-CM | POA: Insufficient documentation

## 2017-05-11 DIAGNOSIS — Z87891 Personal history of nicotine dependence: Secondary | ICD-10-CM | POA: Diagnosis not present

## 2017-05-11 DIAGNOSIS — T300 Burn of unspecified body region, unspecified degree: Secondary | ICD-10-CM

## 2017-05-11 DIAGNOSIS — Y929 Unspecified place or not applicable: Secondary | ICD-10-CM | POA: Diagnosis not present

## 2017-05-11 DIAGNOSIS — Z794 Long term (current) use of insulin: Secondary | ICD-10-CM | POA: Insufficient documentation

## 2017-05-11 DIAGNOSIS — Y93F1 Activity, caregiving, bathing: Secondary | ICD-10-CM | POA: Diagnosis not present

## 2017-05-11 DIAGNOSIS — X110XXA Contact with hot water in bath or tub, initial encounter: Secondary | ICD-10-CM | POA: Diagnosis not present

## 2017-05-11 DIAGNOSIS — I110 Hypertensive heart disease with heart failure: Secondary | ICD-10-CM | POA: Diagnosis not present

## 2017-05-11 DIAGNOSIS — Y998 Other external cause status: Secondary | ICD-10-CM | POA: Diagnosis not present

## 2017-05-11 DIAGNOSIS — T25232A Burn of second degree of left toe(s) (nail), initial encounter: Secondary | ICD-10-CM | POA: Diagnosis not present

## 2017-05-11 DIAGNOSIS — I2510 Atherosclerotic heart disease of native coronary artery without angina pectoris: Secondary | ICD-10-CM | POA: Insufficient documentation

## 2017-05-11 DIAGNOSIS — Z7982 Long term (current) use of aspirin: Secondary | ICD-10-CM | POA: Insufficient documentation

## 2017-05-11 DIAGNOSIS — E0840 Diabetes mellitus due to underlying condition with diabetic neuropathy, unspecified: Secondary | ICD-10-CM

## 2017-05-11 DIAGNOSIS — E1140 Type 2 diabetes mellitus with diabetic neuropathy, unspecified: Secondary | ICD-10-CM | POA: Insufficient documentation

## 2017-05-11 DIAGNOSIS — T25021A Burn of unspecified degree of right foot, initial encounter: Secondary | ICD-10-CM

## 2017-05-11 LAB — COMPREHENSIVE METABOLIC PANEL
ALK PHOS: 122 U/L (ref 38–126)
ALT: 27 U/L (ref 17–63)
ANION GAP: 13 (ref 5–15)
AST: 25 U/L (ref 15–41)
Albumin: 2.5 g/dL — ABNORMAL LOW (ref 3.5–5.0)
BUN: 15 mg/dL (ref 6–20)
CALCIUM: 8.7 mg/dL — AB (ref 8.9–10.3)
CO2: 23 mmol/L (ref 22–32)
Chloride: 90 mmol/L — ABNORMAL LOW (ref 101–111)
Creatinine, Ser: 1.37 mg/dL — ABNORMAL HIGH (ref 0.61–1.24)
GFR, EST NON AFRICAN AMERICAN: 57 mL/min — AB (ref >60)
Glucose, Bld: 534 mg/dL (ref 65–99)
Potassium: 3.6 mmol/L (ref 3.5–5.1)
Sodium: 126 mmol/L — ABNORMAL LOW (ref 135–145)
TOTAL PROTEIN: 6.7 g/dL (ref 6.5–8.1)
Total Bilirubin: 2.1 mg/dL — ABNORMAL HIGH (ref 0.3–1.2)

## 2017-05-11 LAB — CBC WITH DIFFERENTIAL/PLATELET
BASOS ABS: 0 10*3/uL (ref 0.0–0.1)
BASOS PCT: 0 %
Eosinophils Absolute: 0 10*3/uL (ref 0.0–0.7)
Eosinophils Relative: 0 %
HEMATOCRIT: 37.2 % — AB (ref 39.0–52.0)
Hemoglobin: 12.6 g/dL — ABNORMAL LOW (ref 13.0–17.0)
Lymphocytes Relative: 3 %
Lymphs Abs: 0.7 10*3/uL (ref 0.7–4.0)
MCH: 27.7 pg (ref 26.0–34.0)
MCHC: 33.9 g/dL (ref 30.0–36.0)
MCV: 81.8 fL (ref 78.0–100.0)
MONO ABS: 2.1 10*3/uL — AB (ref 0.1–1.0)
Monocytes Relative: 9 %
NEUTROS ABS: 19.7 10*3/uL — AB (ref 1.7–7.7)
Neutrophils Relative %: 88 %
Platelets: 305 10*3/uL (ref 150–400)
RBC: 4.55 MIL/uL (ref 4.22–5.81)
RDW: 12.4 % (ref 11.5–15.5)
WBC: 22.5 10*3/uL — AB (ref 4.0–10.5)

## 2017-05-11 LAB — I-STAT CG4 LACTIC ACID, ED: Lactic Acid, Venous: 1.46 mmol/L (ref 0.5–1.9)

## 2017-05-11 MED ORDER — SODIUM CHLORIDE 0.9 % IV BOLUS
1000.0000 mL | Freq: Once | INTRAVENOUS | Status: AC
  Administered 2017-05-11: 1000 mL via INTRAVENOUS

## 2017-05-11 MED ORDER — RANITIDINE HCL 150 MG/10ML PO SYRP
150.0000 mg | ORAL_SOLUTION | Freq: Once | ORAL | Status: AC
  Administered 2017-05-11: 150 mg via ORAL
  Filled 2017-05-11: qty 10

## 2017-05-11 MED ORDER — OXYCODONE-ACETAMINOPHEN 5-325 MG PO TABS
1.0000 | ORAL_TABLET | Freq: Once | ORAL | Status: AC
  Administered 2017-05-11: 1 via ORAL
  Filled 2017-05-11: qty 1

## 2017-05-11 MED ORDER — OXYCODONE-ACETAMINOPHEN 5-325 MG PO TABS
1.0000 | ORAL_TABLET | Freq: Once | ORAL | Status: AC
Start: 2017-05-11 — End: 2017-05-11
  Administered 2017-05-11: 1 via ORAL
  Filled 2017-05-11: qty 1

## 2017-05-11 NOTE — ED Notes (Signed)
Pt also stating he "passed out" in shower this morning. Stated he felt woozy and then woke up on floor.

## 2017-05-11 NOTE — ED Notes (Signed)
Gave report to [place] at [facility] ED.
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
## 2017-05-11 NOTE — ED Notes (Signed)
Pt signed transfer instead of discharge.  Pt discharged from [facility] ED and transported to [facility].
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
## 2017-05-11 NOTE — ED Notes (Signed)
[place] dispatch called, Put pt on list to be picked up and transferred and he stated he will get person next in line for transfer.
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
## 2017-05-11 NOTE — ED Notes (Signed)
Gave pt a turkey sandwich and water 
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
## 2017-05-18 ENCOUNTER — Ambulatory Visit: Payer: BLUE CROSS/BLUE SHIELD | Admitting: Adult Health

## 2017-08-01 ENCOUNTER — Inpatient Hospital Stay: Payer: Self-pay | Admitting: Adult Health

## 2017-08-09 ENCOUNTER — Encounter: Payer: Self-pay | Admitting: Family Medicine

## 2017-12-20 ENCOUNTER — Telehealth: Payer: Self-pay

## 2017-12-20 NOTE — Telephone Encounter (Signed)
SENT REFERRAL TO SCHEDULING AND FILED NOTES 

## 2018-02-13 ENCOUNTER — Encounter: Payer: Self-pay | Admitting: Physical Therapy

## 2018-02-13 ENCOUNTER — Other Ambulatory Visit: Payer: Self-pay

## 2018-02-13 ENCOUNTER — Ambulatory Visit: Payer: Medicaid Other | Attending: Nurse Practitioner | Admitting: Physical Therapy

## 2018-02-13 DIAGNOSIS — R2681 Unsteadiness on feet: Secondary | ICD-10-CM

## 2018-02-13 DIAGNOSIS — M6281 Muscle weakness (generalized): Secondary | ICD-10-CM | POA: Diagnosis present

## 2018-02-13 DIAGNOSIS — R293 Abnormal posture: Secondary | ICD-10-CM | POA: Diagnosis present

## 2018-02-13 DIAGNOSIS — R2689 Other abnormalities of gait and mobility: Secondary | ICD-10-CM | POA: Diagnosis present

## 2018-02-13 NOTE — Therapy (Signed)
[place] [facility] [address] [place], [place], [address] Phone: [phone]   Fax:  [phone]  Physical Therapy Evaluation  Patient Details  Name: Dustin Lozano MRN: [id number] Date of Birth: Feb 15, 1960 Referring Provider (PT): Antonieta Pert, FNP   Encounter Date: 02/13/2018  PT End of Session - 02/13/18 1119    Visit Number  1    Number of Visits  4    Date for PT Re-Evaluation  03/10/18    Authorization Type  MCD-awaiting approval     PT Start Time  0847    PT Stop Time  0932    PT Time Calculation (min)  45 min    Equipment Utilized During Treatment  Gait belt    Activity Tolerance  Patient tolerated treatment well    Behavior During Therapy  [facility] for tasks assessed/performed       Past Medical History:  Diagnosis Date  . CHF (congestive heart failure) (HCC)    Systolic - LVEF 37-34  (28/7681)   . Coronary artery disease    Nonobstructive disease per cath 08/2011  . Diabetes mellitus   . HTN (hypertension)     Past Surgical History:  Procedure Laterality Date  . APPENDECTOMY    . LEFT AND RIGHT HEART CATHETERIZATION WITH CORONARY ANGIOGRAM N/A 08/17/2011   Procedure: LEFT AND RIGHT HEART CATHETERIZATION WITH CORONARY ANGIOGRAM;  Surgeon: Sherren Mocha, MD;  Location: [facility] CATH LAB;  Service: Cardiovascular;  Laterality: N/A;    There were no vitals filed for this visit.   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  Was soaking in hot water that was too hot and had injury to B feet with partial foot amputation and eventual B BKA as stated previously.  "I want to walk without a device and I want to get back to work."     Pertinent History  DM, HTN, HDL    Limitations  Walking;Standing;House hold activities    Patient Stated Goals  "I want to walk without anything"     Currently in Pain?  No/denies         [facility] PT Assessment - 02/13/18 1572      Assessment   Medical Diagnosis  B BKA    Referring Provider (PT)  Antonieta Pert, FNP    Onset Date/Surgical Date  06/22/17    Prior Therapy  Had OP rehab with [facility] that ended 12/9       Precautions   Precautions  Fall      Restrictions   Weight Bearing Restrictions  No      Balance Screen   Has the patient fallen in the past 6 months  No    Has the patient had a decrease in activity level because of a fear of falling?   No    Is the patient reluctant to leave their home because of a fear of falling?   No      Home Environment   Living Environment  Private residence    Living Arrangements  Alone    Available Help at Discharge  Family;Available PRN/intermittently    Type of Home  Apartment    Home Access  Level entry    Home Layout  One level    [place] - 2 wheels   comfort height toilet      Prior Function   Level of Independence  Independent    Vocation  Part time employment    Vocation Requirements  Was working full time in [employer] previously, traveling for sales, however would like to return to work in [employer] at part time level 16 hours per week  Mostly sedentary sitting at computer.       Cognition   Overall Cognitive Status  Within Functional Limits for tasks assessed      Observation/Other Assessments   Observations  Pt noted to need additional 3 ply sock on R LE due to pressure at bottom of tibia.      Skin Integrity  See prosthetic section for details.       Sensation   Light Touch  Impaired by gross assessment   hx of peripheral neuropathy    Hot/Cold  Impaired by gross assessment    Proprioception  Appears Intact      Coordination   Gross Motor Movements are Fluid and Coordinated  Yes    Fine Motor Movements are Fluid and Coordinated  Yes      ROM / Strength   AROM / PROM / Strength  Strength      Strength   Overall Strength  Deficits    Overall Strength Comments  UE strength grossly WFL,  LE grossly WFL (hip and knee motions)      Transfers    Transfers  Sit to Stand;Stand to Sit    Sit to Stand  5: Supervision    Sit to Stand Details (indicate cue type and reason)  Pt requires use of hands to stand, but is able to stand without stabilizing on RW    Stand to Sit  5: Supervision    Stand to Sit Details  Again, needs UE support and still demonstrates uncontrolled descent.        Ambulation/Gait   Ambulation/Gait  Yes    Ambulation/Gait Assistance  6: Modified independent (Device/Increase time);5: Supervision;4: Min guard    Ambulation/Gait Assistance Details  Pt mod I with use of RW from lobby to clinic room.  Trialed several options during eval to assess safety.  First had pt use B SPCs.  Pt initially advancing foot and cane on same side, however then provided cues for opposite placement of cane with improved balance/stability.  Also had pt trial use of SPC in both R and L hand.  Pt requires min/guard to close S with cane in either hand.  Had only slight difference in gait speed/safety with cane in either hand, see gait times below. Ended with pt ambulating without AD with B prosthesis at min A level.  Seems to have some functional weakness in L LE>RLE    Ambulation Distance (Feet)  60 Feet   50' x 5 reps   Assistive device  Rolling walker;Straight cane;None   B SPCs    Gait Pattern  Step-through pattern;Decreased arm swing - right;Decreased arm swing - left;Decreased stride length;Right flexed knee in stance;Left flexed knee in stance;Trendelenburg;Lateral hip instability    Ambulation Surface  Level;Indoor    Gait velocity  2.14 ft/sec with B canes, 2.10 ft/sec with single cane in R hand, 2.04 ft/sec with SPC in L hand, 2.18 ft/sec without AD with min A, 2.78 ft/sec when cued to ambulate fast with SPC in R hand    Stairs  Yes    Stairs Assistance  4: Min guard    Stair Management Technique  One rail Left;Step to pattern;Forwards;With cane    Number of Stairs  4    Height of Stairs  6  Standardized Balance Assessment    Standardized Balance Assessment  Berg Balance Test;Dynamic Gait Index      Berg Balance Test   Sit to Stand  Able to stand  independently using hands    Standing Unsupported  Able to stand safely 2 minutes    Sitting with Back Unsupported but Feet Supported on Floor or Stool  Able to sit safely and securely 2 minutes    Stand to Sit  Controls descent by using hands    Transfers  Able to transfer safely, definite need of hands    Standing Unsupported with Eyes Closed  Able to stand 10 seconds with supervision    Standing Ubsupported with Feet Together  Able to place feet together independently and stand for 1 minute with supervision    From Standing, Reach Forward with Outstretched Arm  Can reach forward >12 cm safely (5")    From Standing Position, Pick up Object from Floor  Able to pick up shoe, needs supervision    From Standing Position, Turn to Look Behind Over each Shoulder  Looks behind from both sides and weight shifts well    Turn 360 Degrees  Needs assistance while turning    Standing Unsupported, Alternately Place Feet on Step/Stool  Needs assistance to keep from falling or unable to try    Standing Unsupported, One Foot in Front  Able to take small step independently and hold 30 seconds    Standing on One Leg  Tries to lift leg/unable to hold 3 seconds but remains standing independently    Total Score  36    Berg comment:  < 36 high risk for falls       Dynamic Gait Index   Level Surface  Mild Impairment    Change in Gait Speed  Moderate Impairment    Gait with Horizontal Head Turns  Mild Impairment    Gait with Vertical Head Turns  Mild Impairment    Gait and Pivot Turn  Moderate Impairment    Step Over Obstacle  Severe Impairment    Step Around Obstacles  Mild Impairment    Steps  Moderate Impairment    Total Score  11    DGI comment:  19 or less are predicitve of falls in older community living adults.      Prosthetics Assessment - 02/13/18 0845      Prosthetics    Prosthetic Care Independent with  Proper wear schedule/adjustment;Proper weight-bearing schedule/adjustment    Prosthetic Care Dependent with  Skin check;Residual limb care;Correct ply sock adjustment    Prosthetic Care Comments   Provided education on safety when transferring to toilet without prostheses donned.  Cues for adjusting ply socks as fluid changes, esp on R side to decrease pressure at end of residual limb.      Donning prosthesis   Supervision    Doffing prosthesis   Modified independent (Device/Increase time)    Current prosthetic wear tolerance (days/week)   7 days/wk    Current prosthetic wear tolerance (#hours/day)   most of awake hours, taking off between 6-8pm     Current prosthetic weight-bearing tolerance (hours/day)   Pt reports that he is not using RW in home, therefore can tolerate full weight bearing on prostheses.     Edema  no edema    Residual limb condition   L: Normal hair growth, no moisture, slight heat rash, no open areas, possible pressure at patella vs heat rash.  R: Normal hair growth, some pressure noted  at distal end of tibia               Objective measurements completed on examination: See above findings.              PT Education - 02/13/18 1119    Education Details  evaluation findings, POC, goals, see prothetic care section     Person(s) Educated  Patient    Methods  Explanation    Comprehension  Verbalized understanding       PT Short Term Goals - 02/13/18 1129      PT SHORT TERM GOAL #1   Title  Pt will initiate HEP in order to indicate improved strength and decreased fall risk.  (Target Date: Following 3rd visit)    Baseline  dependent     Time  4    Period  Weeks    Status  New      PT SHORT TERM GOAL #2   Title  Pt will ambulate over indoor surfaces x 200' w/ SPC and B prostheses at mod I level in order to indicate safe home negotiation.     Baseline  S to min/guard A for single SPC at this time for up to 200'     Time  4    Period  Weeks    Status  New      PT SHORT TERM GOAL #3   Title  Pt will improve gait speed to 2.62 ft/sec with single SPC in order to indicate more efficient gait and decreased fall risk.     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  He had been receiving therapy at [facility] in [facility], however was unahappy with care and therefore would like to be seen here for prothetic training.  Pt with history of DM with peripheral neuropathy, cardiomyopathy, CHF, HTN, and CAD.  Upon PT evaluation, note gait speed varied depending on device given during session from 2.04 to 2.34 ft/sec with B canes to single SPC indicative of limited community ambulation, DGI score of 11/24 and BERG score of 36/56 both indicative of high/significant fall risk.  Pt will benefit from skilled OP neuro PT in order to address deficits.      History and Personal Factors relevant to plan of care:  See above    Clinical Presentation  Evolving    Clinical Presentation due to:  see above    Clinical Decision Making  Moderate    Rehab Potential  Good    Clinical Impairments Affecting Rehab Potential  Pt very motivated, limited by MCD visit limitations    PT Frequency  1x / week   then 2x/wk for 6 weeks   PT Duration  3 weeks   then 2x/wk for 6 weeks   PT Treatment/Interventions  ADLs/Self Care Home Management;DME Instruction;Gait training;Stair training;Functional mobility training;Therapeutic activities;Therapeutic exercise;Balance training;Neuromuscular re-education;Patient/family education;Prosthetic Training;Passive range of motion;Energy conservation;Vestibular    PT Next Visit Plan  Continue to educate on proper ply sock adjustments (tolerating 3 ply on R?), give HEP  for BLE strength and balance, continue gait with LRAD-Single SPC vs quad tip  cane?    Consulted and Agree with Plan of Care  Patient       Patient will benefit from skilled therapeutic intervention in order to improve the following deficits and impairments:  Abnormal gait, Cardiopulmonary status limiting activity, Decreased activity tolerance, Decreased balance, Decreased endurance, Decreased knowledge of use of DME, Decreased mobility, Decreased strength, Impaired perceived functional ability, Impaired flexibility, Impaired sensation, Postural dysfunction  Visit Diagnosis: Unsteadiness on feet  Other abnormalities of gait and mobility  Muscle weakness (generalized)  Abnormal posture     Problem List Patient Active Problem List   Diagnosis Date Noted  . Diabetic neuropathy ([place]) 10/22/2015  . Chronic systolic heart failure ([place]) 08/24/2011  . HTN (hypertension), malignant 08/14/2011  . Diabetes mellitus type 2, noninsulin dependent ([place]) 08/14/2011    Dustin Lozano, Betha Loa 02/13/2018, 11:34 AM  [place] [facility] [address] [place], [place], [address] Phone: [phone]   Fax:  [phone]  Name: Dustin Lozano MRN: [id number] Date of Birth: 11-24-1960

## 2018-02-15 ENCOUNTER — Ambulatory Visit: Payer: Self-pay | Admitting: Interventional Cardiology

## 2018-02-20 ENCOUNTER — Ambulatory Visit: Payer: Medicaid Other | Admitting: Physical Therapy

## 2018-02-20 ENCOUNTER — Encounter: Payer: Self-pay | Admitting: Physical Therapy

## 2018-02-20 DIAGNOSIS — R2689 Other abnormalities of gait and mobility: Secondary | ICD-10-CM

## 2018-02-20 DIAGNOSIS — M6281 Muscle weakness (generalized): Secondary | ICD-10-CM

## 2018-02-20 DIAGNOSIS — R293 Abnormal posture: Secondary | ICD-10-CM

## 2018-02-20 DIAGNOSIS — R2681 Unsteadiness on feet: Secondary | ICD-10-CM | POA: Diagnosis not present

## 2018-02-20 NOTE — Patient Instructions (Addendum)
1. Dry limb & liner 2-3 times during day. Apply antiperspirant at night.  2. Sit to/from stand with 2 hands, right hand only & left hand only from chairs with & without armrests. 3. Walk in hall looking right/left and up/down 4. Pick up light items from floor. 5.Walk with narrow stance.

## 2018-02-20 NOTE — Therapy (Signed)
[facility] [address] [place], [place], [address] Phone: [phone]   Fax:  [phone]  Physical Therapy Treatment  Patient Details  Name: Dustin Lozano MRN: [id number] Date of Birth: [birth year] Referring Provider (PT): Antonieta Pert, FNP   Encounter Date: 02/20/2018  PT End of Session - 02/20/18 0845    Visit Number  2    Number of Visits  4    Date for PT Re-Evaluation  03/10/18    Authorization Type  MCD-awaiting approval     Authorization Time Period  3 visits 02/20/2018 - 03/12/2018    PT Start Time  0800    PT Stop Time  0845    PT Time Calculation (min)  45 min    Equipment Utilized During Treatment  Gait belt    Activity Tolerance  Patient tolerated treatment well    Behavior During Therapy  [facility] for tasks assessed/performed       Past Medical History:  Diagnosis Date  . CHF (congestive heart failure) (HCC)    Systolic - LVEF 93-71  (69/6789)   . Coronary artery disease    Nonobstructive disease per cath 08/2011  . Diabetes mellitus   . HTN (hypertension)     Past Surgical History:  Procedure Laterality Date  . APPENDECTOMY    . LEFT AND RIGHT HEART CATHETERIZATION WITH CORONARY ANGIOGRAM N/A 08/17/2011   Procedure: LEFT AND RIGHT HEART CATHETERIZATION WITH CORONARY ANGIOGRAM;  Surgeon: Sherren Mocha, MD;  Location: [facility] CATH LAB;  Service: Cardiovascular;  Laterality: N/A;    There were no vitals filed for this visit.  Subjective Assessment - 02/20/18 0754    Subjective  He is wearing prostheses most of awake hours. No falls.     Pertinent History  DM, HTN, HDL    Limitations  Walking;Standing;House hold activities    Patient Stated Goals  "I want to walk without anything"     Currently in Pain?  No/denies                       [facility] Adult PT Treatment/Exercise - 02/20/18 0800      Transfers   Transfers  Sit to Stand;Stand to Sit    Sit to Stand  5: Supervision;With upper  extremity assist;With armrests;From chair/3-in-1    Sit to Stand Details (indicate cue type and reason)  support surface anteriorly with goal not to touch: progressed from chairs with armrests with BUEs, RUE, LUE to chairs without armrests with BUEs from anterior chair, RUE & LUE from front corner of chair     Stand to Sit  5: Supervision;With upper extremity assist;With armrests;To chair/3-in-1    Stand to Sit Details  support surface anteriorly with goal not to touch: progressed from chairs with armrests with BUEs, RUE, LUE to chairs without armrests with BUEs from anterior chair, RUE & LUE from front corner of chair       Ambulation/Gait   Ambulation/Gait  Yes    Ambulation/Gait Assistance  4: Min guard;5: Supervision    Ambulation/Gait Assistance Details  worked on prosthetic gait with cane in LUE, demo & verbal cues on narrow (2-4" step width), progressed to scanning & carrying plate of objects around furniture    Ambulation Distance (Feet)  200 Feet   200' X 2   Assistive device  Straight cane;Prostheses   arrived & exited ambulating with RW   Ambulation Surface  Indoor;Level      Self-Care  Self-Care  Lifting    Lifting  PT demo, instructed in technique to pick up object from floor with bil. TTA prostheses.  Pt return demo with RW anterior & chair posterior for safety with supervision. 3 reps with ea LE forward.       Prosthetics   Prosthetic Care Comments   sweat management drying 2-3 x/day and using antiperspirant at night    Current prosthetic wear tolerance (days/week)   daily    Current prosthetic wear tolerance (#hours/day)   most of awake hours, taking off between 6-8pm     Edema  no edema    Residual limb condition   L: Normal hair growth, no moisture, slight heat rash, no open areas, possible pressure at patella vs heat rash.  R: Normal hair growth, some pressure noted at distal end of tibia    Education Provided  Skin check;Residual limb care;Other (comment)   see  prosthetic care comments   Person(s) Educated  Patient    Education Method  Explanation;Verbal cues    Education Method  Verbalized understanding;Verbal cues required;Needs further instruction             PT Education - 02/20/18 0830    Education Details  written instructions of activities from today to work on over next week, driving with prostheses, different methods, DMV requires able to be safe. They do not require hand controls. PT recommends working on gas/brake in middle of empty parking lot.     Person(s) Educated  Patient    Methods  Explanation;Verbal cues    Comprehension  Verbalized understanding;Verbal cues required       PT Short Term Goals - 02/13/18 1129      PT SHORT TERM GOAL #1   Title  Pt will initiate HEP in order to indicate improved strength and decreased fall risk.  (Target Date: Following 3rd visit)    Baseline  dependent     Time  4    Period  Weeks    Status  New      PT SHORT TERM GOAL #2   Title  Pt will ambulate over indoor surfaces x 200' w/ SPC and B prostheses at mod I level in order to indicate safe home negotiation.     Baseline  S to min/guard A for single SPC at this time for up to 200'    Time  4    Period  Weeks    Status  New      PT SHORT TERM GOAL #3   Title  Pt will improve gait speed to 2.62 ft/sec with single SPC in order to indicate more efficient gait and decreased fall risk.     Baseline  2.10 ft/sec with single SPC and S to min/guard    Time  4    Period  Weeks    Status  New               Plan - 02/20/18 1222    Clinical Impression Statement  Today's skilled session focused on progressing prostheses wear without issues and prosthetic gait with cane narrower stance, scanning & picking up items from floor.     Rehab Potential  Good    Clinical Impairments Affecting Rehab Potential  Pt very motivated, limited by MCD visit limitations    PT Frequency  1x / week   then 2x/wk for 6 weeks   PT Duration  3 weeks    then 2x/wk for 6 weeks   PT  Treatment/Interventions  ADLs/Self Care Home Management;DME Instruction;Gait training;Stair training;Functional mobility training;Therapeutic activities;Therapeutic exercise;Balance training;Neuromuscular re-education;Patient/family education;Prosthetic Training;Passive range of motion;Energy conservation;Vestibular    PT Next Visit Plan  Continue to educate on proper ply sock adjustments (tolerating 3 ply on R?), give HEP for BLE strength and balance, continue gait with LRAD-Single SPC vs quad tip cane?    Consulted and Agree with Plan of Care  Patient       Patient will benefit from skilled therapeutic intervention in order to improve the following deficits and impairments:  Abnormal gait, Cardiopulmonary status limiting activity, Decreased activity tolerance, Decreased balance, Decreased endurance, Decreased knowledge of use of DME, Decreased mobility, Decreased strength, Impaired perceived functional ability, Impaired flexibility, Impaired sensation, Postural dysfunction  Visit Diagnosis: Unsteadiness on feet  Other abnormalities of gait and mobility  Muscle weakness (generalized)  Abnormal posture     Problem List Patient Active Problem List   Diagnosis Date Noted  . Diabetic neuropathy ([place]) 10/22/2015  . Chronic systolic heart failure ([place]) 08/24/2011  . HTN (hypertension), malignant 08/14/2011  . Diabetes mellitus type 2, noninsulin dependent ([place]) 08/14/2011    Ion Gonnella PT, DPT 02/20/2018, 12:25 PM  [facility] [address] [place], [place], [address] Phone: [phone]   Fax:  [phone]  Name: Dustin Lozano MRN: [id number] Date of Birth: 05/14/1960

## 2018-02-28 ENCOUNTER — Ambulatory Visit: Payer: Medicaid Other | Admitting: Physical Therapy

## 2018-02-28 ENCOUNTER — Encounter: Payer: Self-pay | Admitting: Physical Therapy

## 2018-02-28 DIAGNOSIS — M6281 Muscle weakness (generalized): Secondary | ICD-10-CM

## 2018-02-28 DIAGNOSIS — R2681 Unsteadiness on feet: Secondary | ICD-10-CM

## 2018-02-28 DIAGNOSIS — R293 Abnormal posture: Secondary | ICD-10-CM

## 2018-02-28 DIAGNOSIS — R2689 Other abnormalities of gait and mobility: Secondary | ICD-10-CM

## 2018-03-01 NOTE — Therapy (Signed)
[facility] [address] [place], [place], [address] Phone: [phone]   Fax:  [phone]  Physical Therapy Treatment  Patient Details  Name: Dustin Lozano MRN: [id number] Date of Birth: 11/15/1960 Referring Provider (PT): Antonieta Pert, FNP   Encounter Date: 02/28/2018  PT End of Session - 02/28/18 0847    Visit Number  3    Number of Visits  4    Date for PT Re-Evaluation  03/10/18    Authorization Type  MCD-awaiting approval     Authorization Time Period  3 visits 02/20/2018 - 03/12/2018    PT Start Time  0800    PT Stop Time  0847    PT Time Calculation (min)  47 min    Equipment Utilized During Treatment  Gait belt    Activity Tolerance  Patient tolerated treatment well    Behavior During Therapy  [facility] for tasks assessed/performed       Past Medical History:  Diagnosis Date  . CHF (congestive heart failure) (HCC)    Systolic - LVEF 16-96  (78/9381)   . Coronary artery disease    Nonobstructive disease per cath 08/2011  . Diabetes mellitus   . HTN (hypertension)     Past Surgical History:  Procedure Laterality Date  . APPENDECTOMY    . LEFT AND RIGHT HEART CATHETERIZATION WITH CORONARY ANGIOGRAM N/A 08/17/2011   Procedure: LEFT AND RIGHT HEART CATHETERIZATION WITH CORONARY ANGIOGRAM;  Surgeon: Sherren Mocha, MD;  Location: [facility] CATH LAB;  Service: Cardiovascular;  Laterality: N/A;    There were no vitals filed for this visit.  Subjective Assessment - 02/28/18 0800    Subjective  He is wearing prostheses without issues. No falls. He got approved [employer] transportation.     Pertinent History  DM, HTN, HDL    Limitations  Walking;Standing;House hold activities    Patient Stated Goals  "I want to walk without anything"     Currently in Pain?  No/denies                       [facility] Adult PT Treatment/Exercise - 02/28/18 0800      Transfers   Transfers  Sit to Stand;Stand to Sit;Floor to  Transfer    Sit to Stand  5: Supervision;With upper extremity assist;With armrests;From chair/3-in-1    Sit to Stand Details (indicate cue type and reason)  support surface anteriorly with goal not to touch: progressed from chairs with armrests with BUEs, RUE, LUE to chairs without armrests with BUEs from anterior chair, RUE & LUE from front corner of chair     Stand to Sit  5: Supervision;With upper extremity assist;With armrests;To chair/3-in-1    Stand to Sit Details  support surface anteriorly with goal not to touch: progressed from chairs with armrests with BUEs, RUE, LUE to chairs without armrests with BUEs from anterior chair, RUE & LUE from front corner of chair     Floor to Transfer  5: Supervision;With upper extremity assist    Floor to Transfer Details (indicate cue type and reason)  PT demo & instructed in technique with Bil. Transtibial Prostheses pushing on horizontal surface. Pt return demo understanding using chair bottom leading with both LEs.       Ambulation/Gait   Ambulation/Gait  Yes    Ambulation/Gait Assistance  5: Supervision    Ambulation/Gait Assistance Details  verbal cues on upright posture    Ambulation Distance (Feet)  200 Feet  200' X 2   Assistive device  Straight cane;Prostheses   arrived & exited ambulating with RW   Ambulation Surface  Indoor;Level      Self-Care   Self-Care  --    Lifting  --      Therapeutic Activites    Therapeutic Activities  Lifting;Work Librarian, academic, instructed in technique to pick up boxes from floor with bil. TTA prostheses.  Pt return demo lifting 15# crate 2 reps with ea LE forward.     Work Radiation protection practitioner, instructed in push & pull activities with Bil. TTA prostheses with foot position & wt shifting. Pt return demo understanding with supervision.       Prosthetics   Prosthetic Care Comments   PT instructed in marking liners for Rt & Lt, making sure his pelite liners on proper limb, assessing height of  LEs with mirror / level pelvis, and adjusting ply socks.     Current prosthetic wear tolerance (days/week)   daily    Current prosthetic wear tolerance (#hours/day)   most of awake hours, drying limb/liner q5 hrs or signs of sweating.    Edema  no edema    Residual limb condition   L: Normal hair growth, no moisture, slight heat rash, no open areas, possible pressure at patella vs heat rash.  R: Normal hair growth, some pressure noted at distal end of tibia    Education Provided  Skin check;Residual limb care;Other (comment);Correct ply sock adjustment;Proper Donning;Proper wear schedule/adjustment   see prosthetic care comments   Person(s) Educated  Patient    Education Method  Explanation;Demonstration;Tactile cues;Verbal cues    Education Method  Verbalized understanding;Returned demonstration;Tactile cues required;Verbal cues required;Needs further instruction               PT Short Term Goals - 02/28/18 1800      PT SHORT TERM GOAL #1   Title  Pt will initiate HEP in order to indicate improved strength and decreased fall risk.  (Target Date: Following 3rd visit)    Time  4    Period  Weeks    Status  On-going      PT SHORT TERM GOAL #2   Title  Pt will ambulate over indoor surfaces x 200' w/ SPC and B prostheses at mod I level in order to indicate safe home negotiation.     Time  4    Period  Weeks    Status  On-going      PT SHORT TERM GOAL #3   Title  Pt will improve gait speed to 2.62 ft/sec with single SPC in order to indicate more efficient gait and decreased fall risk.     Time  4    Period  Weeks    Status  On-going               Plan - 02/28/18 1800    Clinical Impression Statement  Today's skilled session focused on prosthetic care with proper adjusting ply socks and equal limb height. Also instructed in floor transfers, lifting boxes and push/pull activities like vaccuuming & sweeping.     Rehab Potential  Good    Clinical Impairments Affecting  Rehab Potential  Pt very motivated, limited by MCD visit limitations    PT Frequency  1x / week   then 2x/wk for 6 weeks   PT Duration  3 weeks   then 2x/wk for 6 weeks   PT Treatment/Interventions  ADLs/Self Care Home Management;DME Instruction;Gait training;Stair training;Functional mobility training;Therapeutic activities;Therapeutic exercise;Balance training;Neuromuscular re-education;Patient/family education;Prosthetic Training;Passive range of motion;Energy conservation;Vestibular    PT Next Visit Plan  assess STGs and do recert along with Medicaid reauthorization    Consulted and Agree with Plan of Care  Patient       Patient will benefit from skilled therapeutic intervention in order to improve the following deficits and impairments:  Abnormal gait, Cardiopulmonary status limiting activity, Decreased activity tolerance, Decreased balance, Decreased endurance, Decreased knowledge of use of DME, Decreased mobility, Decreased strength, Impaired perceived functional ability, Impaired flexibility, Impaired sensation, Postural dysfunction  Visit Diagnosis: Unsteadiness on feet  Other abnormalities of gait and mobility  Muscle weakness (generalized)  Abnormal posture     Problem List Patient Active Problem List   Diagnosis Date Noted  . Diabetic neuropathy ([place]) 10/22/2015  . Chronic systolic heart failure ([place]) 08/24/2011  . HTN (hypertension), malignant 08/14/2011  . Diabetes mellitus type 2, noninsulin dependent ([place]) 08/14/2011    Delsin Copen  PT, DPT 03/01/2018, 6:32 AM  [facility] [address] [place], [place], [address] Phone: [phone]   Fax:  [phone]  Name: ANTOINO WESTHOFF MRN: [id number] Date of Birth: 10-Sep-1960

## 2018-03-06 ENCOUNTER — Encounter: Payer: Self-pay | Admitting: Physical Therapy

## 2018-03-06 ENCOUNTER — Ambulatory Visit: Payer: Medicaid Other | Admitting: Physical Therapy

## 2018-03-06 DIAGNOSIS — R293 Abnormal posture: Secondary | ICD-10-CM

## 2018-03-06 DIAGNOSIS — R2681 Unsteadiness on feet: Secondary | ICD-10-CM | POA: Diagnosis not present

## 2018-03-06 DIAGNOSIS — M6281 Muscle weakness (generalized): Secondary | ICD-10-CM

## 2018-03-06 DIAGNOSIS — R2689 Other abnormalities of gait and mobility: Secondary | ICD-10-CM

## 2018-03-06 NOTE — Therapy (Signed)
[facility] [address] [place], [place], [address] Phone: [phone]   Fax:  [phone]  Physical Therapy Treatment & Recertification  Patient Details  Name: Dustin Lozano MRN: [id number] Date of Birth: 1960/10/17 Referring Provider (PT): Antonieta Pert, FNP   Encounter Date: 03/06/2018  PT End of Session - 03/06/18 0905    Visit Number  4    Number of Visits  16    Authorization Type  Medicaid    Authorization Time Period  3 visits 02/20/2018 - 03/12/2018,     Authorization - Visit Number  3    Authorization - Number of Visits  3    PT Start Time  0802    PT Stop Time  8101    PT Time Calculation (min)  45 min    Equipment Utilized During Treatment  Gait belt    Activity Tolerance  Patient tolerated treatment well    Behavior During Therapy  [facility] for tasks assessed/performed       Past Medical History:  Diagnosis Date  . CHF (congestive heart failure) (HCC)    Systolic - LVEF 75-10  (25/8527)   . Coronary artery disease    Nonobstructive disease per cath 08/2011  . Diabetes mellitus   . HTN (hypertension)     Past Surgical History:  Procedure Laterality Date  . APPENDECTOMY    . LEFT AND RIGHT HEART CATHETERIZATION WITH CORONARY ANGIOGRAM N/A 08/17/2011   Procedure: LEFT AND RIGHT HEART CATHETERIZATION WITH CORONARY ANGIOGRAM;  Surgeon: Sherren Mocha, MD;  Location: [facility] CATH LAB;  Service: Cardiovascular;  Laterality: N/A;    There were no vitals filed for this visit.  Subjective Assessment - 03/06/18 0803    Subjective  He drove and was able to use right prosthesis to go back / forth gas / brake. He is wearing prostheses most of awake hours without issues.     Pertinent History  DM, HTN, HDL    Limitations  Walking;Standing;House hold activities    Patient Stated Goals  "I want to walk without anything"     Currently in Pain?  No/denies         [facility] PT Assessment - 03/06/18 0800       Ambulation/Gait   Gait velocity  2.98 ft/sec comfortable cane, 3.13 ft/sec fast cane,  3.01 ft/sec no device min guard   Initial 2canes 2.14, 2.04-2.1 1 cane, 2.78 fast minA     Berg Balance Test   Sit to Stand  Able to stand  independently using hands    Standing Unsupported  Able to stand safely 2 minutes    Sitting with Back Unsupported but Feet Supported on Floor or Stool  Able to sit safely and securely 2 minutes    Stand to Sit  Controls descent by using hands    Transfers  Able to transfer safely, definite need of hands    Standing Unsupported with Eyes Closed  Able to stand 10 seconds with supervision    Standing Ubsupported with Feet Together  Able to place feet together independently and stand for 1 minute with supervision    From Standing, Reach Forward with Outstretched Arm  Can reach forward >12 cm safely (5")    From Standing Position, Pick up Object from Floor  Able to pick up shoe, needs supervision    From Standing Position, Turn to Look Behind Over each Shoulder  Looks behind from both sides and weight shifts well    Turn  360 Degrees  Needs close supervision or verbal cueing    Standing Unsupported, Alternately Place Feet on Step/Stool  Able to complete >2 steps/needs minimal assist    Standing Unsupported, One Foot in Front  Able to take small step independently and hold 30 seconds    Standing on One Leg  Tries to lift leg/unable to hold 3 seconds but remains standing independently    Total Score  38      Dynamic Gait Index   Level Surface  Mild Impairment    Change in Gait Speed  Moderate Impairment    Gait with Horizontal Head Turns  Mild Impairment    Gait with Vertical Head Turns  Mild Impairment    Gait and Pivot Turn  Mild Impairment    Step Over Obstacle  Moderate Impairment    Step Around Obstacles  Mild Impairment    Steps  Moderate Impairment    Total Score  13                   [facility] Adult PT Treatment/Exercise - 03/06/18 0800       Transfers   Transfers  Sit to Stand;Stand to Sit    Sit to Stand  6: Modified independent (Device/Increase time);With upper extremity assist;With armrests;From chair/3-in-1;Other (comment)   requires UE assist & armrests, able to stabilize no assist   Stand to Sit  6: Modified independent (Device/Increase time);With upper extremity assist;With armrests;To chair/3-in-1;Other (comment)   requires UE assist & armrests, able to stabilize no assist     Ambulation/Gait   Ambulation/Gait  Yes    Ambulation/Gait Assistance  5: Supervision    Ambulation/Gait Assistance Details  verbal cues on posture & step width    Ambulation Distance (Feet)  300 Feet    Assistive device  Straight cane;Prostheses;None    Gait Pattern  Step-through pattern;Decreased arm swing - left;Decreased arm swing - right;Decreased stride length;Lateral hip instability;Wide base of support    Ambulation Surface  Indoor;Level    Stairs  Yes    Stairs Assistance  5: Supervision    Stairs Assistance Details (indicate cue type and reason)  worked on alternating pattern & progressed from 2 rails, to single rail & cane    Stair Management Technique  One rail Right;One rail Left;Step to pattern;Two rails;Alternating pattern;Forwards;With cane    Number of Stairs  4   5 reps varying support   Height of Stairs  6    Ramp  4: Min assist   cane & prostheses   Curb  3: Mod assist   cane & prostheses     Prosthetics   Prosthetic Care Comments   prosthetist may add pads & need to reduce ply socks, reviewed morning / evening routine to improve safety & efficiency incorporating prostheses, wearing & donning prostheses long pants and traveling with prostheses with recommendation to make list of prosthetic needs for packing    Current prosthetic wear tolerance (days/week)   daily    Current prosthetic wear tolerance (#hours/day)   most of awake hours, drying limb/liner q5 hrs or signs of sweating.    Edema  no edema    Residual limb  condition   right limb has redness distally & skin slothing indicating too few socks but no open areas.     Education Provided  Skin check;Residual limb care;Other (comment);Correct ply sock adjustment   see prosthetic care comments.    Person(s) Educated  Patient    Education Method  Explanation;Verbal cues  Education Method  Verbalized understanding;Verbal cues required;Needs further instruction               PT Short Term Goals - 03/06/18 0906      PT SHORT TERM GOAL #1   Title  Pt will initiate HEP in order to indicate improved strength and decreased fall risk.  (Target Date: Following 3rd visit)    Baseline  MET 03/06/2018    Time  4    Period  Weeks    Status  Achieved      PT SHORT TERM GOAL #2   Title  Pt will ambulate over indoor surfaces x 200' w/ SPC and B prostheses at mod I level in order to indicate safe home negotiation.     Baseline  MET 03/06/2018    Time  4    Period  Weeks    Status  Achieved      PT SHORT TERM GOAL #3   Title  Pt will improve gait speed to 2.62 ft/sec with single SPC in order to indicate more efficient gait and decreased fall risk.     Baseline  MET 03/06/2018 Gait velocity with cane & prostheses 2.98 ft/sec    Time  4    Period  Weeks    Status  Achieved      PT SHORT TERM GOAL #4   Title  Patient ambulates 400' outdoors on paved surfaces with cane & prostheses with supervision. (All updated STGs Target Date: 03/31/2018)    Baseline  03/06/2018  Patient ambulates 300' with cane & prostheses indoor surfaces. Dynamic Gait Index with cane 13/24 indicates high fall risk.     Time  3    Period  Weeks    Status  New    Target Date  03/31/18      PT SHORT TERM GOAL #5   Title  Patient negotiates ramps & curbs with cane & prostheses with supervision.     Baseline  03/06/2018: Patient negotiates with cane & prostheses with minimal assist for ramps & moderate assist for curbs.     Time  3    Period  Weeks    Status  New    Target Date   03/31/18        PT Long Term Goals - 03/06/18 0907      PT LONG TERM GOAL #1   Title  Patient verbalizes & demonstrates understanding of proper prosthetic care to enable safe use of prostheses. (All LTGs Target Date: 15 visits after evaluation)    Baseline  03/06/2018  Patient requires cues for skin management, adjusting ply socks with limb volume changes and problem solving new issues.     Time  6    Period  Weeks    Status  New    Target Date  04/21/18      PT LONG TERM GOAL #2   Title  Patient tolerates wear of bilateral prostheses >90% of awake hours without skin issues or limb pain to enable function throughout his day.     Baseline  03/06/2018  Patient is wearing prostheses >90% of awake hours but has skin changes on residual limb with potential for breakdown.     Time  6    Period  Weeks    Status  New    Target Date  04/21/18      PT LONG TERM GOAL #3   Title  Berg Balance >45/56 to indicate lower fall risk.  Baseline  03/06/2018: Merrilee Jansky 38/56    Time  6    Period  Weeks    Status  New    Target Date  04/21/18      PT LONG TERM GOAL #4   Title  Dynamic Gait Index >/= 18/24 to indicate lower fall risk    Baseline  03/06/2018  DGI with cane 13/24    Time  6    Period  Weeks    Status  New    Target Date  04/21/18      PT LONG TERM GOAL #5   Title  Patient ambulates 500' outdoors including grass with cane or less & prostheses modified independent for community mobility.     Baseline  03/06/2018 Patient ambulates 300' with cane & prostheses modified indepedent but only on indoor paved surfaces.     Time  6    Period  Weeks    Status  New    Target Date  04/21/18      PT LONG TERM GOAL #6   Title  Patient negotiates ramps, curbs & stairs with single rail with cane & prostheses modified independent for community access.     Baseline  03/06/2018  Patient requires minimal assist on ramps & moderate assist on curbs with cane & prostheses.     Time  6    Period  Weeks     Status  New    Target Date  04/21/18      PT LONG TERM GOAL #7   Title  Patient ambulates around furniture with prostheses only carrying items in both hands modified independent.     Baseline  Patient ambulates without assistive device with prostheses with minimal guard on straight path only & unable to carry items yet.     Time  6    Period  Weeks    Status  New    Target Date  04/21/18            Plan - 03/06/18 0941    Clinical Impression Statement  Patient met all 3 STGs established for initial Medicaid approved visits. He is improving his function with bilateral prostheses but requires additional PT services to progress to safe community level..    Rehab Potential  Good    Clinical Impairments Affecting Rehab Potential  Pt very motivated, limited by MCD visit limitations    PT Frequency  2x / week   then 2x/wk for 6 weeks   PT Duration  6 weeks   then 2x/wk for 6 weeks   PT Treatment/Interventions  ADLs/Self Care Home Management;DME Instruction;Gait training;Stair training;Functional mobility training;Therapeutic activities;Therapeutic exercise;Balance training;Neuromuscular re-education;Patient/family education;Prosthetic Training;Passive range of motion;Energy conservation;Vestibular    PT Next Visit Plan  work towards updated STGs. Work on balance & gait outdoors as weather permits.     Consulted and Agree with Plan of Care  Patient       Patient will benefit from skilled therapeutic intervention in order to improve the following deficits and impairments:  Abnormal gait, Cardiopulmonary status limiting activity, Decreased activity tolerance, Decreased balance, Decreased endurance, Decreased knowledge of use of DME, Decreased mobility, Decreased strength, Impaired perceived functional ability, Impaired flexibility, Impaired sensation, Postural dysfunction  Visit Diagnosis: Unsteadiness on feet  Other abnormalities of gait and mobility  Muscle weakness  (generalized)  Abnormal posture     Problem List Patient Active Problem List   Diagnosis Date Noted  . Diabetic neuropathy ([place]) 10/22/2015  . Chronic systolic heart failure ([place])  08/24/2011  . HTN (hypertension), malignant 08/14/2011  . Diabetes mellitus type 2, noninsulin dependent ([place]) 08/14/2011    Haillee Johann PT, DPT 03/06/2018, 9:44 AM  [facility] [address] [place], [place], [address] Phone: [phone]   Fax:  [phone]  Name: Dustin Lozano MRN: [id number] Date of Birth: 1960/03/16

## 2018-03-13 ENCOUNTER — Ambulatory Visit: Payer: Medicaid Other | Attending: Nurse Practitioner | Admitting: Physical Therapy

## 2018-03-13 ENCOUNTER — Encounter: Payer: Self-pay | Admitting: Physical Therapy

## 2018-03-13 DIAGNOSIS — M6281 Muscle weakness (generalized): Secondary | ICD-10-CM | POA: Insufficient documentation

## 2018-03-13 DIAGNOSIS — R2681 Unsteadiness on feet: Secondary | ICD-10-CM | POA: Diagnosis not present

## 2018-03-13 DIAGNOSIS — R293 Abnormal posture: Secondary | ICD-10-CM

## 2018-03-13 DIAGNOSIS — R2689 Other abnormalities of gait and mobility: Secondary | ICD-10-CM | POA: Diagnosis present

## 2018-03-13 NOTE — Therapy (Signed)
[facility] [address] [place], [place], [address] Phone: [phone]   Fax:  [phone]  Physical Therapy Treatment  Patient Details  Name: Dustin Lozano MRN: [id number] Date of Birth: [birth year] Referring Provider (PT): Antonieta Pert, FNP   Encounter Date: 03/13/2018  PT End of Session - 03/13/18 0839    Visit Number  5    Number of Visits  16    Authorization Type  Medicaid    Authorization Time Period  3 visits 02/20/2018 - 03/12/2018,  12 visits 03/13/2018 - 04/23/2018    Authorization - Visit Number  1    Authorization - Number of Visits  12    PT Start Time  0836    PT Stop Time  0930    PT Time Calculation (min)  54 min    Equipment Utilized During Treatment  Gait belt    Activity Tolerance  Patient tolerated treatment well    Behavior During Therapy  [facility] for tasks assessed/performed       Past Medical History:  Diagnosis Date  . CHF (congestive heart failure) (HCC)    Systolic - LVEF 01-65  (53/7482)   . Coronary artery disease    Nonobstructive disease per cath 08/2011  . Diabetes mellitus   . HTN (hypertension)     Past Surgical History:  Procedure Laterality Date  . APPENDECTOMY    . LEFT AND RIGHT HEART CATHETERIZATION WITH CORONARY ANGIOGRAM N/A 08/17/2011   Procedure: LEFT AND RIGHT HEART CATHETERIZATION WITH CORONARY ANGIOGRAM;  Surgeon: Sherren Mocha, MD;  Location: [facility] CATH LAB;  Service: Cardiovascular;  Laterality: N/A;    There were no vitals filed for this visit.  Subjective Assessment - 03/13/18 0838    Subjective  He drove himself to PT today.     Pertinent History  DM, HTN, HDL    Limitations  Walking;Standing;House hold activities    Patient Stated Goals  "I want to walk without anything"     Currently in Pain?  No/denies                       [facility] Adult PT Treatment/Exercise - 03/13/18 0840      Transfers   Transfers  Sit to Stand;Stand to Sit    Sit to Stand   6: Modified independent (Device/Increase time);With upper extremity assist;With armrests;From chair/3-in-1;Other (comment)   requires UE assist & armrests, able to stabilize no assist   Stand to Sit  6: Modified independent (Device/Increase time);With upper extremity assist;With armrests;To chair/3-in-1;Other (comment)   requires UE assist & armrests, able to stabilize no assist   Floor to Transfer  5: Supervision;With upper extremity assist      Ambulation/Gait   Ambulation/Gait  Yes    Ambulation/Gait Assistance  5: Supervision;4: Min assist;4: Min guard   MinA / guard no device   Ambulation/Gait Assistance Details  worked on scanning & high level gait with balance with verbal & tactile cues    Ambulation Distance (Feet)  500 Feet    Assistive device  Straight cane;Prostheses;None    Gait Pattern  Step-through pattern;Decreased arm swing - left;Decreased arm swing - right;Decreased stride length;Lateral hip instability;Wide base of support    Ambulation Surface  Indoor;Level    Gait velocity  --    Stairs  --    Stairs Assistance  --    Stair Management Technique  --    Number of Stairs  --    Height  of Stairs  --    Ramp  --    Curb  --      High Level Balance   High Level Balance Activities  Side stepping;Backward walking;Head turns;Tandem walking;Other (comment)   walking with eyes closed (dark) & fast walking   High Level Balance Comments  tactile & verbal cues on technique with Bil. TTA prostheses & balance reactions      Therapeutic Activites    Work Radiation protection practitioner, instructed in climbing A-frame ladder with bil. TTA prostheses. Pt return demo with 2 steps on ladder & simulated release for BUE tasks with min Guard & cues.       Prosthetics   Prosthetic Care Comments   use of baby oil at patella to decrease friction rub. aligning pelite liner & prostheses with patella so not rotated. Sitting LE position to decrease socket rotation & medial knee pressure.     Current  prosthetic wear tolerance (days/week)   daily    Current prosthetic wear tolerance (#hours/day)   most of awake hours, drying limb/liner q5 hrs or signs of sweating.    Edema  no edema    Residual limb condition   no issues except redness on left patella from friction of liner in sitting.     Education Provided  Skin check;Residual limb care;Other (comment);Correct ply sock adjustment   see prosthetic care comments.    Person(s) Educated  Patient    Education Method  Explanation;Demonstration;Verbal cues    Education Method  Verbalized understanding;Needs further instruction          Balance Exercises - 03/13/18 0840      Balance Exercises: Standing   Standing Eyes Opened  Wide ([place]ight[place]ad);Foam/compliant surface;Head turns;5 reps   intermittent UE support   Standing Eyes Closed  Wide (BOA);Foam/compliant surface;10 secs;2 reps    Stepping Strategy  Anterior;Posterior;Lateral;Foam/compliant surface;5 reps   worked on stepping & stabilzation, intermittent UE support         PT Short Term Goals - 03/06/18 0906      PT SHORT TERM GOAL #1   Title  Pt will initiate HEP in order to indicate improved strength and decreased fall risk.  (Target Date: Following 3rd visit)    Baseline  MET 03/06/2018    Time  4    Period  Weeks    Status  Achieved      PT SHORT TERM GOAL #2   Title  Pt will ambulate over indoor surfaces x 200' w/ SPC and B prostheses at mod I level in order to indicate safe home negotiation.     Baseline  MET 03/06/2018    Time  4    Period  Weeks    Status  Achieved      PT SHORT TERM GOAL #3   Title  Pt will improve gait speed to 2.62 ft/sec with single SPC in order to indicate more efficient gait and decreased fall risk.     Baseline  MET 03/06/2018 Gait velocity with cane & prostheses 2.98 ft/sec    Time  4    Period  Weeks    Status  Achieved      PT SHORT TERM GOAL #4   Title  Patient ambulates 400' outdoors on paved surfaces with cane & prostheses with  supervision. (All updated STGs Target Date: 03/31/2018)    Baseline  03/06/2018  Patient ambulates 300' with cane & prostheses indoor surfaces. Dynamic Gait Index with cane 13/24 indicates high fall risk.  Time  3    Period  Weeks    Status  New    Target Date  03/31/18      PT SHORT TERM GOAL #5   Title  Patient negotiates ramps & curbs with cane & prostheses with supervision.     Baseline  03/06/2018: Patient negotiates with cane & prostheses with minimal assist for ramps & moderate assist for curbs.     Time  3    Period  Weeks    Status  New    Target Date  03/31/18        PT Long Term Goals - 03/06/18 0907      PT LONG TERM GOAL #1   Title  Patient verbalizes & demonstrates understanding of proper prosthetic care to enable safe use of prostheses. (All LTGs Target Date: 15 visits after evaluation)    Baseline  03/06/2018  Patient requires cues for skin management, adjusting ply socks with limb volume changes and problem solving new issues.     Time  6    Period  Weeks    Status  New    Target Date  04/21/18      PT LONG TERM GOAL #2   Title  Patient tolerates wear of bilateral prostheses >90% of awake hours without skin issues or limb pain to enable function throughout his day.     Baseline  03/06/2018  Patient is wearing prostheses >90% of awake hours but has skin changes on residual limb with potential for breakdown.     Time  6    Period  Weeks    Status  New    Target Date  04/21/18      PT LONG TERM GOAL #3   Title  Berg Balance >45/56 to indicate lower fall risk.     Baseline  03/06/2018: Merrilee Jansky 38/56    Time  6    Period  Weeks    Status  New    Target Date  04/21/18      PT LONG TERM GOAL #4   Title  Dynamic Gait Index >/= 18/24 to indicate lower fall risk    Baseline  03/06/2018  DGI with cane 13/24    Time  6    Period  Weeks    Status  New    Target Date  04/21/18      PT LONG TERM GOAL #5   Title  Patient ambulates 500' outdoors including grass with cane  or less & prostheses modified independent for community mobility.     Baseline  03/06/2018 Patient ambulates 300' with cane & prostheses modified indepedent but only on indoor paved surfaces.     Time  6    Period  Weeks    Status  New    Target Date  04/21/18      PT LONG TERM GOAL #6   Title  Patient negotiates ramps, curbs & stairs with single rail with cane & prostheses modified independent for community access.     Baseline  03/06/2018  Patient requires minimal assist on ramps & moderate assist on curbs with cane & prostheses.     Time  6    Period  Weeks    Status  New    Target Date  04/21/18      PT LONG TERM GOAL #7   Title  Patient ambulates around furniture with prostheses only carrying items in both hands modified independent.     Baseline  Patient ambulates  without assistive device with prostheses with minimal guard on straight path only & unable to carry items yet.     Time  6    Period  Weeks    Status  New    Target Date  04/21/18            Plan - 03/13/18 1228    Clinical Impression Statement  Today's skilled session addressed friction rub & rotation alignment with donning. PT worked on high level balance activities including scanning, gait with eyes closed to simulate darkness, increasing pace to simulate crossing street, side stepping & backwards gait and climbing ladder.     Rehab Potential  Good    Clinical Impairments Affecting Rehab Potential  Pt very motivated, limited by MCD visit limitations    PT Frequency  2x / week   then 2x/wk for 6 weeks   PT Duration  6 weeks   then 2x/wk for 6 weeks   PT Treatment/Interventions  ADLs/Self Care Home Management;DME Instruction;Gait training;Stair training;Functional mobility training;Therapeutic activities;Therapeutic exercise;Balance training;Neuromuscular re-education;Patient/family education;Prosthetic Training;Passive range of motion;Energy conservation;Vestibular    PT Next Visit Plan  Give corner HEP for  balance with head turns eyes open & static eyes closed and stepping off foam.  work towards updated STGs. Work on balance & gait outdoors as weather permits.     Consulted and Agree with Plan of Care  Patient       Patient will benefit from skilled therapeutic intervention in order to improve the following deficits and impairments:  Abnormal gait, Cardiopulmonary status limiting activity, Decreased activity tolerance, Decreased balance, Decreased endurance, Decreased knowledge of use of DME, Decreased mobility, Decreased strength, Impaired perceived functional ability, Impaired flexibility, Impaired sensation, Postural dysfunction  Visit Diagnosis: Unsteadiness on feet  Other abnormalities of gait and mobility  Muscle weakness (generalized)  Abnormal posture     Problem List Patient Active Problem List   Diagnosis Date Noted  . Diabetic neuropathy ([place]) 10/22/2015  . Chronic systolic heart failure ([place]) 08/24/2011  . HTN (hypertension), malignant 08/14/2011  . Diabetes mellitus type 2, noninsulin dependent ([place]) 08/14/2011    Jamey Reas, PT, DPT 03/13/2018, 12:31 PM  [facility] [address] [place], [place], [address] Phone: [phone]   Fax:  [phone]  Name: Dustin Lozano MRN: [id number] Date of Birth: 11/23/1960

## 2018-03-16 ENCOUNTER — Encounter: Payer: Self-pay | Admitting: Physical Therapy

## 2018-03-16 ENCOUNTER — Ambulatory Visit: Payer: Medicaid Other | Admitting: Physical Therapy

## 2018-03-16 DIAGNOSIS — R2689 Other abnormalities of gait and mobility: Secondary | ICD-10-CM

## 2018-03-16 DIAGNOSIS — R293 Abnormal posture: Secondary | ICD-10-CM

## 2018-03-16 DIAGNOSIS — M6281 Muscle weakness (generalized): Secondary | ICD-10-CM

## 2018-03-16 DIAGNOSIS — R2681 Unsteadiness on feet: Secondary | ICD-10-CM | POA: Diagnosis not present

## 2018-03-16 NOTE — Patient Instructions (Signed)
Access Code: [email or web address]  URL: [email or web address]  Date: 03/16/2018  Prepared by: Willow Ora   Exercises  Wide Stance with Eyes Closed and Head Nods - 10 reps - 1 sets - 1x daily - 5x weekly  Wide Stance with Eyes Closed and Head Rotation - 10 reps - 1 sets - 1x daily - 5x weekly  Wide Stance with Eyes Closed on Foam Pad - 3 reps - 1 sets - 20 hold - 1x daily - 5x weekly  Wide Stance with Head Rotation on Foam Pad - 10 reps - 1 sets - 1x daily - 5x weekly  Wide Stance with Head Nods on Foam Pad - 10 reps - 1 sets - 1x daily - 5x weekly

## 2018-03-17 ENCOUNTER — Encounter: Payer: Medicaid Other | Admitting: Physical Therapy

## 2018-03-17 NOTE — Therapy (Signed)
[facility] [address] [place], [place], [address] Phone: [phone]   Fax:  [phone]  Physical Therapy Treatment  Patient Details  Name: PJ ZEHNER MRN: [id number] Date of Birth: 1961-01-22 Referring Provider (PT): Antonieta Pert, FNP   Encounter Date: 03/16/2018  PT End of Session - 03/16/18 0811    Visit Number  6    Number of Visits  16    Authorization Type  Medicaid    Authorization Time Period  3 visits 02/20/2018 - 03/12/2018,  12 visits 03/13/2018 - 04/23/2018    Authorization - Visit Number  2    Authorization - Number of Visits  12    PT Start Time  0806    PT Stop Time  0845    PT Time Calculation (min)  39 min    Equipment Utilized During Treatment  Gait belt    Activity Tolerance  Patient tolerated treatment well    Behavior During Therapy  [facility] for tasks assessed/performed       Past Medical History:  Diagnosis Date  . CHF (congestive heart failure) (HCC)    Systolic - LVEF 73-71  (07/2692)   . Coronary artery disease    Nonobstructive disease per cath 08/2011  . Diabetes mellitus   . HTN (hypertension)     Past Surgical History:  Procedure Laterality Date  . APPENDECTOMY    . LEFT AND RIGHT HEART CATHETERIZATION WITH CORONARY ANGIOGRAM N/A 08/17/2011   Procedure: LEFT AND RIGHT HEART CATHETERIZATION WITH CORONARY ANGIOGRAM;  Surgeon: Sherren Mocha, MD;  Location: [facility] CATH LAB;  Service: Cardiovascular;  Laterality: N/A;    There were no vitals filed for this visit.  Subjective Assessment - 03/16/18 0811    Subjective  No new complaints. No falls, had one near fall when his cargo pant pocket got caught on a cabinet knob. No pain.    Pertinent History  DM, HTN, HDL    Limitations  Walking;Standing;House hold activities    Patient Stated Goals  "I want to walk without anything"     Currently in Pain?  No/denies            [facility] Adult PT Treatment/Exercise - 03/16/18 0813       Transfers   Transfers  Sit to Stand;Stand to Sit    Sit to Stand  6: Modified independent (Device/Increase time);With upper extremity assist;With armrests;From chair/3-in-1;Other (comment)    Stand to Sit  6: Modified independent (Device/Increase time);With upper extremity assist;With armrests;To chair/3-in-1;Other (comment)      Ambulation/Gait   Ambulation/Gait  Yes    Ambulation/Gait Assistance  5: Supervision    Ambulation Distance (Feet)  --   around gym with activity   Assistive device  Straight cane;Prosthesis    Gait Pattern  Step-through pattern;Decreased arm swing - left;Decreased arm swing - right;Decreased stride length;Lateral hip instability;Wide base of support    Ambulation Surface  Level;Indoor      Therapeutic Activites    Therapeutic Activities  Other Therapeutic Activities    Other Therapeutic Activities  worked on getting onto/of off escalators with simulation using treadmill with aerobic steps on single riser at end: practiced stepping onto moving belt with emphasis on timing of grapping rail (PTA hand) and foot stepping at same time, and getting off with empahsis on timing of cane with stepping. min gaurd to min assist for balance with 2cd person for safety.       Neuro Re-ed    Neuro Re-ed  Details   educated on and issued corner balance HEP. min guard assist for balance with cues on posture and weight shifting for balance assist.       Prosthetics   Current prosthetic wear tolerance (days/week)   daily    Current prosthetic wear tolerance (#hours/day)   most of awake hours, drying limb/liner q5 hrs or signs of sweating.    Residual limb condition   intact with no issues noted.     Donning Prosthesis  Modified independent (device/increased time)    Doffing Prosthesis  Modified independent (device/increased time)       issued the following to HEP: Access Code: [email or web address]  URL: [email or web address]  Date: 03/16/2018  Prepared by: Willow Ora    Exercises  Wide Stance with Eyes Closed and Head Nods - 10 reps - 1 sets - 1x daily - 5x weekly  Wide Stance with Eyes Closed and Head Rotation - 10 reps - 1 sets - 1x daily - 5x weekly  Wide Stance with Eyes Closed on Foam Pad - 3 reps - 1 sets - 20 hold - 1x daily - 5x weekly  Wide Stance with Head Rotation on Foam Pad - 10 reps - 1 sets - 1x daily - 5x weekly  Wide Stance with Head Nods on Foam Pad - 10 reps - 1 sets - 1x daily - 5x weekly        PT Education - 03/16/18 0833    Education Details  Corner balance HEP    Person(s) Educated  Patient    Methods  Explanation;Demonstration;Verbal cues;Handout    Comprehension  Verbalized understanding;Returned demonstration;Verbal cues required;Need further instruction       PT Short Term Goals - 03/06/18 0906      PT SHORT TERM GOAL #1   Title  Pt will initiate HEP in order to indicate improved strength and decreased fall risk.  (Target Date: Following 3rd visit)    Baseline  MET 03/06/2018    Time  4    Period  Weeks    Status  Achieved      PT SHORT TERM GOAL #2   Title  Pt will ambulate over indoor surfaces x 200' w/ SPC and B prostheses at mod I level in order to indicate safe home negotiation.     Baseline  MET 03/06/2018    Time  4    Period  Weeks    Status  Achieved      PT SHORT TERM GOAL #3   Title  Pt will improve gait speed to 2.62 ft/sec with single SPC in order to indicate more efficient gait and decreased fall risk.     Baseline  MET 03/06/2018 Gait velocity with cane & prostheses 2.98 ft/sec    Time  4    Period  Weeks    Status  Achieved      PT SHORT TERM GOAL #4   Title  Patient ambulates 400' outdoors on paved surfaces with cane & prostheses with supervision. (All updated STGs Target Date: 03/31/2018)    Baseline  03/06/2018  Patient ambulates 300' with cane & prostheses indoor surfaces. Dynamic Gait Index with cane 13/24 indicates high fall risk.     Time  3    Period  Weeks    Status  New    Target  Date  03/31/18      PT SHORT TERM GOAL #5   Title  Patient negotiates ramps & curbs with cane & prostheses with supervision.  Baseline  03/06/2018: Patient negotiates with cane & prostheses with minimal assist for ramps & moderate assist for curbs.     Time  3    Period  Weeks    Status  New    Target Date  03/31/18        PT Long Term Goals - 03/06/18 0907      PT LONG TERM GOAL #1   Title  Patient verbalizes & demonstrates understanding of proper prosthetic care to enable safe use of prostheses. (All LTGs Target Date: 15 visits after evaluation)    Baseline  03/06/2018  Patient requires cues for skin management, adjusting ply socks with limb volume changes and problem solving new issues.     Time  6    Period  Weeks    Status  New    Target Date  04/21/18      PT LONG TERM GOAL #2   Title  Patient tolerates wear of bilateral prostheses >90% of awake hours without skin issues or limb pain to enable function throughout his day.     Baseline  03/06/2018  Patient is wearing prostheses >90% of awake hours but has skin changes on residual limb with potential for breakdown.     Time  6    Period  Weeks    Status  New    Target Date  04/21/18      PT LONG TERM GOAL #3   Title  Berg Balance >45/56 to indicate lower fall risk.     Baseline  03/06/2018: Merrilee Jansky 38/56    Time  6    Period  Weeks    Status  New    Target Date  04/21/18      PT LONG TERM GOAL #4   Title  Dynamic Gait Index >/= 18/24 to indicate lower fall risk    Baseline  03/06/2018  DGI with cane 13/24    Time  6    Period  Weeks    Status  New    Target Date  04/21/18      PT LONG TERM GOAL #5   Title  Patient ambulates 500' outdoors including grass with cane or less & prostheses modified independent for community mobility.     Baseline  03/06/2018 Patient ambulates 300' with cane & prostheses modified indepedent but only on indoor paved surfaces.     Time  6    Period  Weeks    Status  New    Target Date   04/21/18      PT LONG TERM GOAL #6   Title  Patient negotiates ramps, curbs & stairs with single rail with cane & prostheses modified independent for community access.     Baseline  03/06/2018  Patient requires minimal assist on ramps & moderate assist on curbs with cane & prostheses.     Time  6    Period  Weeks    Status  New    Target Date  04/21/18      PT LONG TERM GOAL #7   Title  Patient ambulates around furniture with prostheses only carrying items in both hands modified independent.     Baseline  Patient ambulates without assistive device with prostheses with minimal guard on straight path only & unable to carry items yet.     Time  6    Period  Weeks    Status  New    Target Date  04/21/18  Plan - 03/16/18 1886    Clinical Impression Statement  Today's skilled session focused on adding corner balance ex's to HEP with no issues noted or reported in session. Remainder of session addressed how to step onto/off of escalators with emphasis on timing. The pt is making progress toward goals and should benefit from continued PT to progress toward unmet goals.     Rehab Potential  Good    Clinical Impairments Affecting Rehab Potential  Pt very motivated, limited by MCD visit limitations    PT Frequency  2x / week   then 2x/wk for 6 weeks   PT Duration  6 weeks   then 2x/wk for 6 weeks   PT Treatment/Interventions  ADLs/Self Care Home Management;DME Instruction;Gait training;Stair training;Functional mobility training;Therapeutic activities;Therapeutic exercise;Balance training;Neuromuscular re-education;Patient/family education;Prosthetic Training;Passive range of motion;Energy conservation;Vestibular    PT Next Visit Plan  work towards updated STGs. Work on balance & gait outdoors as weather permits.     Consulted and Agree with Plan of Care  Patient       Patient will benefit from skilled therapeutic intervention in order to improve the following deficits and  impairments:  Abnormal gait, Cardiopulmonary status limiting activity, Decreased activity tolerance, Decreased balance, Decreased endurance, Decreased knowledge of use of DME, Decreased mobility, Decreased strength, Impaired perceived functional ability, Impaired flexibility, Impaired sensation, Postural dysfunction  Visit Diagnosis: Unsteadiness on feet  Other abnormalities of gait and mobility  Muscle weakness (generalized)  Abnormal posture     Problem List Patient Active Problem List   Diagnosis Date Noted  . Diabetic neuropathy ([place]) 10/22/2015  . Chronic systolic heart failure ([place]) 08/24/2011  . HTN (hypertension), malignant 08/14/2011  . Diabetes mellitus type 2, noninsulin dependent ([place]) 08/14/2011    Willow Ora, PTA, [facility] [address], [place], [place] [address] [phone] 03/17/18, 10:14 AM  Name: NOVAK STGERMAINE MRN: [id number] Date of Birth: 12/26/1960

## 2018-03-20 ENCOUNTER — Encounter: Payer: Self-pay | Admitting: Physical Therapy

## 2018-03-20 ENCOUNTER — Ambulatory Visit: Payer: Medicaid Other | Admitting: Physical Therapy

## 2018-03-20 DIAGNOSIS — R293 Abnormal posture: Secondary | ICD-10-CM

## 2018-03-20 DIAGNOSIS — R2681 Unsteadiness on feet: Secondary | ICD-10-CM

## 2018-03-20 DIAGNOSIS — R2689 Other abnormalities of gait and mobility: Secondary | ICD-10-CM

## 2018-03-20 DIAGNOSIS — M6281 Muscle weakness (generalized): Secondary | ICD-10-CM

## 2018-03-21 NOTE — Therapy (Signed)
[facility] [address] [place], [place], [address] Phone: [phone]   Fax:  [phone]  Physical Therapy Treatment  Patient Details  Name: Dustin Lozano MRN: [id number] Date of Birth: 1960-03-12 Referring Provider (PT): Antonieta Pert, FNP   Encounter Date: 03/20/2018  PT End of Session - 03/20/18 1800    Visit Number  7    Number of Visits  16    Authorization Type  Medicaid    Authorization Time Period  3 visits 02/20/2018 - 03/12/2018,  12 visits 03/13/2018 - 04/23/2018    Authorization - Visit Number  3    Authorization - Number of Visits  12    PT Start Time  0800    PT Stop Time  0845    PT Time Calculation (min)  45 min    Equipment Utilized During Treatment  Gait belt    Activity Tolerance  Patient tolerated treatment well    Behavior During Therapy  [facility] for tasks assessed/performed       Past Medical History:  Diagnosis Date  . CHF (congestive heart failure) (HCC)    Systolic - LVEF 19-41  (74/0814)   . Coronary artery disease    Nonobstructive disease per cath 08/2011  . Diabetes mellitus   . HTN (hypertension)     Past Surgical History:  Procedure Laterality Date  . APPENDECTOMY    . LEFT AND RIGHT HEART CATHETERIZATION WITH CORONARY ANGIOGRAM N/A 08/17/2011   Procedure: LEFT AND RIGHT HEART CATHETERIZATION WITH CORONARY ANGIOGRAM;  Surgeon: Sherren Mocha, MD;  Location: [facility] CATH LAB;  Service: Cardiovascular;  Laterality: N/A;    There were no vitals filed for this visit.  Subjective Assessment - 03/20/18 0800    Subjective  No pain. No falls. He now is not going back to work until mid-March.     Pertinent History  DM, HTN, HDL    Limitations  Walking;Standing;House hold activities    Patient Stated Goals  "I want to walk without anything"     Currently in Pain?  No/denies                       [facility] Adult PT Treatment/Exercise - 03/20/18 0800      Transfers   Transfers  Sit  to Stand;Stand to Sit    Sit to Stand  6: Modified independent (Device/Increase time);With upper extremity assist;With armrests;From chair/3-in-1;Other (comment)    Stand to Sit  6: Modified independent (Device/Increase time);With upper extremity assist;With armrests;To chair/3-in-1;Other (comment)      Ambulation/Gait   Ambulation/Gait  Yes    Ambulation/Gait Assistance  5: Supervision    Ambulation/Gait Assistance Details  verbal cues on upright posture    Ambulation Distance (Feet)  200 Feet    Assistive device  Prostheses;None    Gait Pattern  Step-through pattern;Decreased arm swing - left;Decreased arm swing - right;Decreased stride length;Lateral hip instability;Wide base of support    Ambulation Surface  Indoor;Level    Stairs  Yes    Stairs Assistance  5: Supervision    Stairs Assistance Details (indicate cue type and reason)  demo, verbal & tactile cues on technique for alternating pattern progressed from 2 rails to single rail & cane    Stair Management Technique  Two rails;One rail Left;One rail Right;With cane;Alternating pattern;Forwards    Number of Stairs  4   10 reps     Therapeutic Activites    Therapeutic Activities  --  Other Therapeutic Activities  PT instructed in treadmill use & safety. Pt ambulated 1.17mh initially up to 2.025m. Pt return demo how to safely use & adjust speed.       Neuro Re-ed    Neuro Re-ed Details   --      Knee/Hip Exercises: Machines for Strengthening   Cybex Knee Extension  PT simulated knee extension machine with verbal cues on proper placement of LE pad. Pt verbalized understanding.     Cybex Knee Flexion  PT simulated knee flexion machine with verbal cues on proper placement of LE pad. Pt verbalized understanding.     Total Gym Leg Press  PT instructed on use of leg press with bilateral Transtibial Amputations and how to determine ideal weight. Pt performed 5 reps 80# & 5 reps 100# without straining.  120# 2 reps with straining &  verbalizes understanding that this is too much weight currently. 60# too light.       Prosthetics   Prosthetic Care Comments   PT educated pt on socket revisions typically ~6 months into initial temporary prostheses.  Suction sleeve suspension as option for second socket.  Pt is loosing Medicaid at end of Feb and looking into insurance options.    Current prosthetic wear tolerance (days/week)   daily    Current prosthetic wear tolerance (#hours/day)   most of awake hours, drying limb/liner q5 hrs or signs of sweating.    Residual limb condition   intact with no issues noted.     Education Provided  Skin check;Residual limb care;Other (comment)   see prosthetic care comments   Person(s) Educated  Patient    Education Method  Explanation;Verbal cues    Education Method  Verbalized understanding;Verbal cues required    Donning Prosthesis  Supervision    Doffing Prosthesis  Supervision               PT Short Term Goals - 03/06/18 0906      PT SHORT TERM GOAL #1   Title  Pt will initiate HEP in order to indicate improved strength and decreased fall risk.  (Target Date: Following 3rd visit)    Baseline  MET 03/06/2018    Time  4    Period  Weeks    Status  Achieved      PT SHORT TERM GOAL #2   Title  Pt will ambulate over indoor surfaces x 200' w/ SPC and B prostheses at mod I level in order to indicate safe home negotiation.     Baseline  MET 03/06/2018    Time  4    Period  Weeks    Status  Achieved      PT SHORT TERM GOAL #3   Title  Pt will improve gait speed to 2.62 ft/sec with single SPC in order to indicate more efficient gait and decreased fall risk.     Baseline  MET 03/06/2018 Gait velocity with cane & prostheses 2.98 ft/sec    Time  4    Period  Weeks    Status  Achieved      PT SHORT TERM GOAL #4   Title  Patient ambulates 400' outdoors on paved surfaces with cane & prostheses with supervision. (All updated STGs Target Date: 03/31/2018)    Baseline  03/06/2018   Patient ambulates 300' with cane & prostheses indoor surfaces. Dynamic Gait Index with cane 13/24 indicates high fall risk.     Time  3    Period  Weeks  Status  New    Target Date  03/31/18      PT SHORT TERM GOAL #5   Title  Patient negotiates ramps & curbs with cane & prostheses with supervision.     Baseline  03/06/2018: Patient negotiates with cane & prostheses with minimal assist for ramps & moderate assist for curbs.     Time  3    Period  Weeks    Status  New    Target Date  03/31/18        PT Long Term Goals - 03/06/18 0907      PT LONG TERM GOAL #1   Title  Patient verbalizes & demonstrates understanding of proper prosthetic care to enable safe use of prostheses. (All LTGs Target Date: 15 visits after evaluation)    Baseline  03/06/2018  Patient requires cues for skin management, adjusting ply socks with limb volume changes and problem solving new issues.     Time  6    Period  Weeks    Status  New    Target Date  04/21/18      PT LONG TERM GOAL #2   Title  Patient tolerates wear of bilateral prostheses >90% of awake hours without skin issues or limb pain to enable function throughout his day.     Baseline  03/06/2018  Patient is wearing prostheses >90% of awake hours but has skin changes on residual limb with potential for breakdown.     Time  6    Period  Weeks    Status  New    Target Date  04/21/18      PT LONG TERM GOAL #3   Title  Berg Balance >45/56 to indicate lower fall risk.     Baseline  03/06/2018: Merrilee Jansky 38/56    Time  6    Period  Weeks    Status  New    Target Date  04/21/18      PT LONG TERM GOAL #4   Title  Dynamic Gait Index >/= 18/24 to indicate lower fall risk    Baseline  03/06/2018  DGI with cane 13/24    Time  6    Period  Weeks    Status  New    Target Date  04/21/18      PT LONG TERM GOAL #5   Title  Patient ambulates 500' outdoors including grass with cane or less & prostheses modified independent for community mobility.     Baseline   03/06/2018 Patient ambulates 300' with cane & prostheses modified indepedent but only on indoor paved surfaces.     Time  6    Period  Weeks    Status  New    Target Date  04/21/18      PT LONG TERM GOAL #6   Title  Patient negotiates ramps, curbs & stairs with single rail with cane & prostheses modified independent for community access.     Baseline  03/06/2018  Patient requires minimal assist on ramps & moderate assist on curbs with cane & prostheses.     Time  6    Period  Weeks    Status  New    Target Date  04/21/18      PT LONG TERM GOAL #7   Title  Patient ambulates around furniture with prostheses only carrying items in both hands modified independent.     Baseline  Patient ambulates without assistive device with prostheses with minimal guard on straight path only &  unable to carry items yet.     Time  6    Period  Weeks    Status  New    Target Date  04/21/18            Plan - 03/20/18 1800    Clinical Impression Statement  Today's skilled session focused on educating patient on use of equipment in fitness room at complex. Patient appears to understand safe use. Pt also plans to look into insurance options when Medicaid ends.     Rehab Potential  Good    Clinical Impairments Affecting Rehab Potential  Pt very motivated, limited by MCD visit limitations    PT Frequency  2x / week   then 2x/wk for 6 weeks   PT Duration  6 weeks   then 2x/wk for 6 weeks   PT Treatment/Interventions  ADLs/Self Care Home Management;DME Instruction;Gait training;Stair training;Functional mobility training;Therapeutic activities;Therapeutic exercise;Balance training;Neuromuscular re-education;Patient/family education;Prosthetic Training;Passive range of motion;Energy conservation;Vestibular    PT Next Visit Plan  work towards updated STGs. Work on balance & gait outdoors as weather permits.     Consulted and Agree with Plan of Care  Patient       Patient will benefit from skilled  therapeutic intervention in order to improve the following deficits and impairments:  Abnormal gait, Cardiopulmonary status limiting activity, Decreased activity tolerance, Decreased balance, Decreased endurance, Decreased knowledge of use of DME, Decreased mobility, Decreased strength, Impaired perceived functional ability, Impaired flexibility, Impaired sensation, Postural dysfunction  Visit Diagnosis: Unsteadiness on feet  Other abnormalities of gait and mobility  Muscle weakness (generalized)  Abnormal posture     Problem List Patient Active Problem List   Diagnosis Date Noted  . Diabetic neuropathy ([place]) 10/22/2015  . Chronic systolic heart failure ([place]) 08/24/2011  . HTN (hypertension), malignant 08/14/2011  . Diabetes mellitus type 2, noninsulin dependent ([place]) 08/14/2011    Intisar Claudio PT, DPT 03/21/2018, 6:31 AM  [facility] [address] [place], [place], [address] Phone: [phone]   Fax:  [phone]  Name: NAPOLEAN SIA MRN: [id number] Date of Birth: 12-12-1960

## 2018-03-23 ENCOUNTER — Encounter: Payer: Self-pay | Admitting: Physical Therapy

## 2018-03-23 ENCOUNTER — Ambulatory Visit: Payer: Medicaid Other | Admitting: Physical Therapy

## 2018-03-23 DIAGNOSIS — M6281 Muscle weakness (generalized): Secondary | ICD-10-CM

## 2018-03-23 DIAGNOSIS — R2681 Unsteadiness on feet: Secondary | ICD-10-CM | POA: Diagnosis not present

## 2018-03-23 DIAGNOSIS — R2689 Other abnormalities of gait and mobility: Secondary | ICD-10-CM

## 2018-03-23 DIAGNOSIS — R293 Abnormal posture: Secondary | ICD-10-CM

## 2018-03-23 NOTE — Therapy (Signed)
[facility] [address] [place], [place], [address] Phone: [phone]   Fax:  [phone]  Physical Therapy Treatment  Patient Details  Name: Dustin Lozano MRN: [id number] Date of Birth: Feb 14, 1960 Referring Provider (PT): Antonieta Pert, FNP   Encounter Date: 03/23/2018  PT End of Session - 03/23/18 0917    Visit Number  8    Number of Visits  16    Authorization Type  Medicaid    Authorization Time Period  3 visits 02/20/2018 - 03/12/2018,  12 visits 03/13/2018 - 04/23/2018    Authorization - Visit Number  4    Authorization - Number of Visits  12    PT Start Time  0800    PT Stop Time  0845    PT Time Calculation (min)  45 min    Equipment Utilized During Treatment  Gait belt    Activity Tolerance  Patient tolerated treatment well    Behavior During Therapy  [facility] for tasks assessed/performed       Past Medical History:  Diagnosis Date  . CHF (congestive heart failure) (HCC)    Systolic - LVEF 33-82  (50/5397)   . Coronary artery disease    Nonobstructive disease per cath 08/2011  . Diabetes mellitus   . HTN (hypertension)     Past Surgical History:  Procedure Laterality Date  . APPENDECTOMY    . LEFT AND RIGHT HEART CATHETERIZATION WITH CORONARY ANGIOGRAM N/A 08/17/2011   Procedure: LEFT AND RIGHT HEART CATHETERIZATION WITH CORONARY ANGIOGRAM;  Surgeon: Sherren Mocha, MD;  Location: [facility] CATH LAB;  Service: Cardiovascular;  Laterality: N/A;    There were no vitals filed for this visit.  Subjective Assessment - 03/23/18 0759    Subjective  No falls. He has been walking without cane in home and limited community with good weather. He changes shoes.     Pertinent History  DM, HTN, HDL    Limitations  Walking;Standing;House hold activities    Patient Stated Goals  "I want to walk without anything"     Currently in Pain?  No/denies                       [facility] Adult PT Treatment/Exercise -  03/23/18 0800      Transfers   Transfers  Sit to Stand;Stand to Sit    Sit to Stand  6: Modified independent (Device/Increase time);With upper extremity assist;With armrests;From chair/3-in-1;Other (comment)    Stand to Sit  6: Modified independent (Device/Increase time);With upper extremity assist;With armrests;To chair/3-in-1;Other (comment)      Ambulation/Gait   Ambulation/Gait  Yes    Ambulation/Gait Assistance  4: Min guard;5: Supervision   min guard no device new shoes, supervision cane   Ambulation/Gait Assistance Details  verbal cues on upright posture & wt shift over stance limb    Ambulation Distance (Feet)  200 Feet   200' X 2    Assistive device  Prostheses;None    Gait Pattern  Step-through pattern;Decreased arm swing - left;Decreased arm swing - right;Decreased stride length;Lateral hip instability;Wide base of support    Ambulation Surface  Indoor;Level    Stairs  Yes    Stairs Assistance  5: Supervision    Stairs Assistance Details (indicate cue type and reason)  verbal & demo cues on proper technique with bilateral prostheses    Stair Management Technique  Two rails;One rail Left;One rail Right;With cane;Alternating pattern;Forwards    Number of Stairs  4  10 reps   Ramp  4: Min assist   prostheses & no device   Ramp Details (indicate cue type and reason)  verbal & tactile/manual cues on technique without device with bil TTA prostheses    Curb  4: Min assist   cane & prostheses   Curb Details (indicate cue type and reason)  PT demo technique without device but did not perform due to time constraints      High Level Balance   High Level Balance Activities  Side stepping;Backward walking;Direction changes;Turns;Other (comment)   bil TTA prostheses in parallel bars w/ occasiona intermitten   High Level Balance Comments  demo & verbal cues on technique & wt shifts with bil. TTA prostheses      Therapeutic Activites    Other Therapeutic Activities  --      Neuro  Re-ed    Neuro Re-ed Details   upright posture with glut sets to extend hips / Yoga mountain pose      Knee/Hip Exercises: Machines for Strengthening   Cybex Knee Extension  --    Cybex Knee Flexion  --    Total Gym Leg Press  --      Prosthetics   Prosthetic Care Comments   use of cutoff socks under proximal liner & over distal liner.  PT discussed various shoes designs & options with prostheses.     Current prosthetic wear tolerance (days/week)   daily    Current prosthetic wear tolerance (#hours/day)   most of awake hours, drying limb/liner q5 hrs or signs of sweating.    Residual limb condition   intact with no issues noted but patient reports had blisters at proximal liner area 2 days ago that have healed    Education Provided  Skin check;Residual limb care;Other (comment)   see prosthetic care comments   Person(s) Educated  Patient    Education Method  Explanation;Demonstration;Tactile cues;Verbal cues    Education Method  Verbalized understanding;Returned demonstration;Tactile cues required;Verbal cues required;Needs further instruction               PT Short Term Goals - 03/06/18 0906      PT SHORT TERM GOAL #1   Title  Pt will initiate HEP in order to indicate improved strength and decreased fall risk.  (Target Date: Following 3rd visit)    Baseline  MET 03/06/2018    Time  4    Period  Weeks    Status  Achieved      PT SHORT TERM GOAL #2   Title  Pt will ambulate over indoor surfaces x 200' w/ SPC and B prostheses at mod I level in order to indicate safe home negotiation.     Baseline  MET 03/06/2018    Time  4    Period  Weeks    Status  Achieved      PT SHORT TERM GOAL #3   Title  Pt will improve gait speed to 2.62 ft/sec with single SPC in order to indicate more efficient gait and decreased fall risk.     Baseline  MET 03/06/2018 Gait velocity with cane & prostheses 2.98 ft/sec    Time  4    Period  Weeks    Status  Achieved      PT SHORT TERM GOAL #4    Title  Patient ambulates 400' outdoors on paved surfaces with cane & prostheses with supervision. (All updated STGs Target Date: 03/31/2018)    Baseline  03/06/2018  Patient  ambulates 300' with cane & prostheses indoor surfaces. Dynamic Gait Index with cane 13/24 indicates high fall risk.     Time  3    Period  Weeks    Status  New    Target Date  03/31/18      PT SHORT TERM GOAL #5   Title  Patient negotiates ramps & curbs with cane & prostheses with supervision.     Baseline  03/06/2018: Patient negotiates with cane & prostheses with minimal assist for ramps & moderate assist for curbs.     Time  3    Period  Weeks    Status  New    Target Date  03/31/18        PT Long Term Goals - 03/06/18 0907      PT LONG TERM GOAL #1   Title  Patient verbalizes & demonstrates understanding of proper prosthetic care to enable safe use of prostheses. (All LTGs Target Date: 15 visits after evaluation)    Baseline  03/06/2018  Patient requires cues for skin management, adjusting ply socks with limb volume changes and problem solving new issues.     Time  6    Period  Weeks    Status  New    Target Date  04/21/18      PT LONG TERM GOAL #2   Title  Patient tolerates wear of bilateral prostheses >90% of awake hours without skin issues or limb pain to enable function throughout his day.     Baseline  03/06/2018  Patient is wearing prostheses >90% of awake hours but has skin changes on residual limb with potential for breakdown.     Time  6    Period  Weeks    Status  New    Target Date  04/21/18      PT LONG TERM GOAL #3   Title  Berg Balance >45/56 to indicate lower fall risk.     Baseline  03/06/2018: Merrilee Jansky 38/56    Time  6    Period  Weeks    Status  New    Target Date  04/21/18      PT LONG TERM GOAL #4   Title  Dynamic Gait Index >/= 18/24 to indicate lower fall risk    Baseline  03/06/2018  DGI with cane 13/24    Time  6    Period  Weeks    Status  New    Target Date  04/21/18      PT  LONG TERM GOAL #5   Title  Patient ambulates 500' outdoors including grass with cane or less & prostheses modified independent for community mobility.     Baseline  03/06/2018 Patient ambulates 300' with cane & prostheses modified indepedent but only on indoor paved surfaces.     Time  6    Period  Weeks    Status  New    Target Date  04/21/18      PT LONG TERM GOAL #6   Title  Patient negotiates ramps, curbs & stairs with single rail with cane & prostheses modified independent for community access.     Baseline  03/06/2018  Patient requires minimal assist on ramps & moderate assist on curbs with cane & prostheses.     Time  6    Period  Weeks    Status  New    Target Date  04/21/18      PT LONG TERM GOAL #7   Title  Patient ambulates around furniture with prostheses only carrying items in both hands modified independent.     Baseline  Patient ambulates without assistive device with prostheses with minimal guard on straight path only & unable to carry items yet.     Time  6    Period  Weeks    Status  New    Target Date  04/21/18            Plan - 03/23/18 4782    Clinical Impression Statement  PT educated patient on moving different directions right/left, backwards & turning. Patient improved gait without device level surfaces and ramps.     Rehab Potential  Good    Clinical Impairments Affecting Rehab Potential  Pt very motivated, limited by MCD visit limitations    PT Frequency  2x / week   then 2x/wk for 6 weeks   PT Duration  6 weeks   then 2x/wk for 6 weeks   PT Treatment/Interventions  ADLs/Self Care Home Management;DME Instruction;Gait training;Stair training;Functional mobility training;Therapeutic activities;Therapeutic exercise;Balance training;Neuromuscular re-education;Patient/family education;Prosthetic Training;Passive range of motion;Energy conservation;Vestibular    PT Next Visit Plan  check updated STGs. Work on balance & gait with prostheses only including  outdoors as weather permits    Consulted and Agree with Plan of Care  Patient       Patient will benefit from skilled therapeutic intervention in order to improve the following deficits and impairments:  Abnormal gait, Cardiopulmonary status limiting activity, Decreased activity tolerance, Decreased balance, Decreased endurance, Decreased knowledge of use of DME, Decreased mobility, Decreased strength, Impaired perceived functional ability, Impaired flexibility, Impaired sensation, Postural dysfunction  Visit Diagnosis: Unsteadiness on feet  Other abnormalities of gait and mobility  Muscle weakness (generalized)  Abnormal posture     Problem List Patient Active Problem List   Diagnosis Date Noted  . Diabetic neuropathy ([place]) 10/22/2015  . Chronic systolic heart failure ([place]) 08/24/2011  . HTN (hypertension), malignant 08/14/2011  . Diabetes mellitus type 2, noninsulin dependent ([place]) 08/14/2011    Kodie Pick PT, DPT 03/23/2018, 9:21 AM  [facility] [address] [place], [place], [address] Phone: [phone]   Fax:  [phone]  Name: Dustin Lozano MRN: [id number] Date of Birth: February 06, 1961

## 2018-03-27 ENCOUNTER — Encounter: Payer: Self-pay | Admitting: Physical Therapy

## 2018-03-27 ENCOUNTER — Ambulatory Visit: Payer: Medicaid Other | Admitting: Physical Therapy

## 2018-03-27 DIAGNOSIS — R2681 Unsteadiness on feet: Secondary | ICD-10-CM | POA: Diagnosis not present

## 2018-03-27 DIAGNOSIS — R293 Abnormal posture: Secondary | ICD-10-CM

## 2018-03-27 DIAGNOSIS — M6281 Muscle weakness (generalized): Secondary | ICD-10-CM

## 2018-03-27 DIAGNOSIS — R2689 Other abnormalities of gait and mobility: Secondary | ICD-10-CM

## 2018-03-27 NOTE — Therapy (Signed)
[facility] [address] [place], [place], [address] Phone: [phone]   Fax:  [phone]  Physical Therapy Treatment  Patient Details  Name: Dustin Lozano MRN: [id number] Date of Birth: Apr 29, 1960 Referring Provider (PT): Antonieta Pert, FNP   Encounter Date: 03/27/2018  PT End of Session - 03/27/18 1311    Visit Number  9    Number of Visits  16    Authorization Type  Medicaid    Authorization Time Period  3 visits 02/20/2018 - 03/12/2018,  12 visits 03/13/2018 - 04/23/2018    Authorization - Visit Number  5    Authorization - Number of Visits  12    PT Start Time  0800    PT Stop Time  0845    PT Time Calculation (min)  45 min    Equipment Utilized During Treatment  Gait belt    Activity Tolerance  Patient tolerated treatment well    Behavior During Therapy  [facility] for tasks assessed/performed       Past Medical History:  Diagnosis Date  . CHF (congestive heart failure) (HCC)    Systolic - LVEF 27-03  (50/0938)   . Coronary artery disease    Nonobstructive disease per cath 08/2011  . Diabetes mellitus   . HTN (hypertension)     Past Surgical History:  Procedure Laterality Date  . APPENDECTOMY    . LEFT AND RIGHT HEART CATHETERIZATION WITH CORONARY ANGIOGRAM N/A 08/17/2011   Procedure: LEFT AND RIGHT HEART CATHETERIZATION WITH CORONARY ANGIOGRAM;  Surgeon: Sherren Mocha, MD;  Location: [facility] CATH LAB;  Service: Cardiovascular;  Laterality: N/A;    There were no vitals filed for this visit.  Subjective Assessment - 03/27/18 0800    Subjective  No falls. He has not used cane over weekend.     Pertinent History  DM, HTN, HDL    Limitations  Walking;Standing;House hold activities    Patient Stated Goals  "I want to walk without anything"     Currently in Pain?  No/denies                       [facility] Adult PT Treatment/Exercise - 03/27/18 0800      Transfers   Transfers  Sit to Stand;Stand to Sit     Sit to Stand  6: Modified independent (Device/Increase time);With upper extremity assist;With armrests;From chair/3-in-1;Other (comment)    Stand to Sit  6: Modified independent (Device/Increase time);With upper extremity assist;With armrests;To chair/3-in-1;Other (comment)      Ambulation/Gait   Ambulation/Gait  Yes    Ambulation/Gait Assistance  5: Supervision    Ambulation/Gait Assistance Details  tactile & verbal cues on upright posture & step width    Ambulation Distance (Feet)  200 Feet   200' X 2    Assistive device  Prostheses;None    Gait Pattern  Step-through pattern;Decreased arm swing - left;Decreased arm swing - right;Decreased stride length;Lateral hip instability;Wide base of support    Stairs  Yes    Stairs Assistance  5: Supervision    Stairs Assistance Details (indicate cue type and reason)  cues on foot position for knee control    Stair Management Technique  Two rails;One rail Left;One rail Right;Alternating pattern;Forwards    Number of Stairs  4   5 reps   Ramp  4: Min assist   prostheses & no device   Curb  4: Min assist   cane & prostheses  High Level Balance   High Level Balance Activities  Side stepping;Backward walking;Direction changes;Turns;Other (comment);Braiding;Head turns   bil TTA prostheses in parallel bars w/occasional intermitten   High Level Balance Comments  demo & verbal cues on technique & wt shifts with bil. TTA prostheses      Neuro Re-ed    Neuro Re-ed Details   --      Prosthetics   Prosthetic Care Comments   --    Current prosthetic wear tolerance (days/week)   daily    Current prosthetic wear tolerance (#hours/day)   most of awake hours, drying limb/liner q5 hrs or signs of sweating.    Residual limb condition   no issues    Education Provided  Skin check;Residual limb care;Other (comment)   see prosthetic care comments         Balance Exercises - 03/27/18 0800      Balance Exercises: Standing   Stepping Strategy   Anterior;Posterior;Lateral;5 reps   alt stepping off/stabilization anticipatory & randomly   Rockerboard  Anterior/posterior;Lateral;EO;Head turns;10 reps;Intermittent UE support   wt shifts, stabilization, recovery         PT Short Term Goals - 03/06/18 0906      PT SHORT TERM GOAL #1   Title  Pt will initiate HEP in order to indicate improved strength and decreased fall risk.  (Target Date: Following 3rd visit)    Baseline  MET 03/06/2018    Time  4    Period  Weeks    Status  Achieved      PT SHORT TERM GOAL #2   Title  Pt will ambulate over indoor surfaces x 200' w/ SPC and B prostheses at mod I level in order to indicate safe home negotiation.     Baseline  MET 03/06/2018    Time  4    Period  Weeks    Status  Achieved      PT SHORT TERM GOAL #3   Title  Pt will improve gait speed to 2.62 ft/sec with single SPC in order to indicate more efficient gait and decreased fall risk.     Baseline  MET 03/06/2018 Gait velocity with cane & prostheses 2.98 ft/sec    Time  4    Period  Weeks    Status  Achieved      PT SHORT TERM GOAL #4   Title  Patient ambulates 400' outdoors on paved surfaces with cane & prostheses with supervision. (All updated STGs Target Date: 03/31/2018)    Baseline  03/06/2018  Patient ambulates 300' with cane & prostheses indoor surfaces. Dynamic Gait Index with cane 13/24 indicates high fall risk.     Time  3    Period  Weeks    Status  New    Target Date  03/31/18      PT SHORT TERM GOAL #5   Title  Patient negotiates ramps & curbs with cane & prostheses with supervision.     Baseline  03/06/2018: Patient negotiates with cane & prostheses with minimal assist for ramps & moderate assist for curbs.     Time  3    Period  Weeks    Status  New    Target Date  03/31/18        PT Long Term Goals - 03/06/18 0907      PT LONG TERM GOAL #1   Title  Patient verbalizes & demonstrates understanding of proper prosthetic care to enable safe use of prostheses.  (All   LTGs Target Date: 15 visits after evaluation)    Baseline  03/06/2018  Patient requires cues for skin management, adjusting ply socks with limb volume changes and problem solving new issues.     Time  6    Period  Weeks    Status  New    Target Date  04/21/18      PT LONG TERM GOAL #2   Title  Patient tolerates wear of bilateral prostheses >90% of awake hours without skin issues or limb pain to enable function throughout his day.     Baseline  03/06/2018  Patient is wearing prostheses >90% of awake hours but has skin changes on residual limb with potential for breakdown.     Time  6    Period  Weeks    Status  New    Target Date  04/21/18      PT LONG TERM GOAL #3   Title  Berg Balance >45/56 to indicate lower fall risk.     Baseline  03/06/2018: Merrilee Jansky 38/56    Time  6    Period  Weeks    Status  New    Target Date  04/21/18      PT LONG TERM GOAL #4   Title  Dynamic Gait Index >/= 18/24 to indicate lower fall risk    Baseline  03/06/2018  DGI with cane 13/24    Time  6    Period  Weeks    Status  New    Target Date  04/21/18      PT LONG TERM GOAL #5   Title  Patient ambulates 500' outdoors including grass with cane or less & prostheses modified independent for community mobility.     Baseline  03/06/2018 Patient ambulates 300' with cane & prostheses modified indepedent but only on indoor paved surfaces.     Time  6    Period  Weeks    Status  New    Target Date  04/21/18      PT LONG TERM GOAL #6   Title  Patient negotiates ramps, curbs & stairs with single rail with cane & prostheses modified independent for community access.     Baseline  03/06/2018  Patient requires minimal assist on ramps & moderate assist on curbs with cane & prostheses.     Time  6    Period  Weeks    Status  New    Target Date  04/21/18      PT LONG TERM GOAL #7   Title  Patient ambulates around furniture with prostheses only carrying items in both hands modified independent.     Baseline   Patient ambulates without assistive device with prostheses with minimal guard on straight path only & unable to carry items yet.     Time  6    Period  Weeks    Status  New    Target Date  04/21/18            Plan - 03/27/18 1527    Clinical Impression Statement  Today's skilled session focused on facilitating balance reactions with residual limb/knee, hip & step strategies. And PT worked on advanced balance with direction changes 90* & 180*.     Rehab Potential  Good    Clinical Impairments Affecting Rehab Potential  Pt very motivated, limited by MCD visit limitations    PT Frequency  2x / week   then 2x/wk for 6 weeks   PT Duration  6  weeks   then 2x/wk for 6 weeks   PT Treatment/Interventions  ADLs/Self Care Home Management;DME Instruction;Gait training;Stair training;Functional mobility training;Therapeutic activities;Therapeutic exercise;Balance training;Neuromuscular re-education;Patient/family education;Prosthetic Training;Passive range of motion;Energy conservation;Vestibular    PT Next Visit Plan  check updated STGs. Work on balance & gait with prostheses only including outdoors as weather permits    Consulted and Agree with Plan of Care  Patient       Patient will benefit from skilled therapeutic intervention in order to improve the following deficits and impairments:  Abnormal gait, Cardiopulmonary status limiting activity, Decreased activity tolerance, Decreased balance, Decreased endurance, Decreased knowledge of use of DME, Decreased mobility, Decreased strength, Impaired perceived functional ability, Impaired flexibility, Impaired sensation, Postural dysfunction  Visit Diagnosis: Unsteadiness on feet  Other abnormalities of gait and mobility  Muscle weakness (generalized)  Abnormal posture     Problem List Patient Active Problem List   Diagnosis Date Noted  . Diabetic neuropathy (HCC) 10/22/2015  . Chronic systolic heart failure (HCC) 08/24/2011  . HTN  (hypertension), malignant 08/14/2011  . Diabetes mellitus type 2, noninsulin dependent (HCC) 08/14/2011    WALDRON,ROBIN PT, DPT 03/27/2018, 3:30 PM   [facility] [address] [place], [place], [address] Phone: [phone]   Fax:  [phone]  Name: Donya R Pharris MRN: [id number] Date of Birth: 10/15/1960   

## 2018-03-30 ENCOUNTER — Ambulatory Visit: Payer: Medicaid Other | Admitting: Physical Therapy

## 2018-03-30 ENCOUNTER — Encounter: Payer: Self-pay | Admitting: Physical Therapy

## 2018-03-30 DIAGNOSIS — R2689 Other abnormalities of gait and mobility: Secondary | ICD-10-CM

## 2018-03-30 DIAGNOSIS — M6281 Muscle weakness (generalized): Secondary | ICD-10-CM

## 2018-03-30 DIAGNOSIS — R2681 Unsteadiness on feet: Secondary | ICD-10-CM | POA: Diagnosis not present

## 2018-03-30 DIAGNOSIS — R293 Abnormal posture: Secondary | ICD-10-CM

## 2018-03-30 NOTE — Patient Instructions (Signed)
Fitness Plan has 4 components.  1. Endurance - Recommend machines that are sitting with back support that uses both your arms and your legs.Or can do walking program Goal is 20-30 minutes. 2. Strength - weight machines enough weight to have resistance but not so much that you have to strain or cheat.  Goal is to do 15 repetitions for 2-3 sets. Rest 30-60 seconds between sets.  Do 2-4 leg machines, 2-4 arm machines & 2-4 trunk machines. Look for pictures to make sure you exercise both sides of arm, leg or trunk.  Leg machines like leg press (can do both legs or each leg by themselves),   At home can do theraband exercises for arms or legs, floor transfers, sit to stand to sit using arms as little as possible, Yoga positions 3.Flexibility - make sure you include arms, legs & trunk. Can do Yoga also 4. Balance- can work in corner with chair in front - head turns, arm motions, eyes closed; place one foot inside cabinet or on 4-6" block to work on one-legged stance,   Try to get to each component 3-5 times per week.  Going to [employer] [facility] or fitness center you want do things you can not do at home.  For example use bikes at [facility] if you don't have one at home. Then on days you don't go to [facility], do exercises at home.  At least 150 minutes a week of moderate intensity exercise. Try not to go more than 2 days in a row without being active for at least 30 minutes a day. Any activity where you are up and moving is good- Walking, bicycling, stationary bicycling, dancing.  (moderate intensity means to get  a little out of breath)   Do resistance exercise at least 2 times a week.  This can be yoga poses or strength training where you lift your own weight (think leg lifts or toe raises)  or light weights like cans of beans with your arms.    Flexibility and balance exercise- safe stretching and practicing balance is very important to health.

## 2018-03-30 NOTE — Therapy (Signed)
[facility] [address] [place], [place], [address] Phone: [phone]   Fax:  [phone]  Physical Therapy Treatment  Patient Details  Name: Dustin Lozano MRN: [id number] Date of Birth: [birth year] Referring Provider (PT): Antonieta Pert, FNP   Encounter Date: 03/30/2018  PT End of Session - 03/30/18 0913    Visit Number  10    Number of Visits  16    Authorization Type  Medicaid    Authorization Time Period  3 visits 02/20/2018 - 03/12/2018,  12 visits 03/13/2018 - 04/23/2018    Authorization - Visit Number  6    Authorization - Number of Visits  12    PT Start Time  0758    PT Stop Time  0845    PT Time Calculation (min)  47 min    Equipment Utilized During Treatment  Gait belt    Activity Tolerance  Patient tolerated treatment well    Behavior During Therapy  [facility] for tasks assessed/performed       Past Medical History:  Diagnosis Date  . CHF (congestive heart failure) (HCC)    Systolic - LVEF 41-28  (78/6767)   . Coronary artery disease    Nonobstructive disease per cath 08/2011  . Diabetes mellitus   . HTN (hypertension)     Past Surgical History:  Procedure Laterality Date  . APPENDECTOMY    . LEFT AND RIGHT HEART CATHETERIZATION WITH CORONARY ANGIOGRAM N/A 08/17/2011   Procedure: LEFT AND RIGHT HEART CATHETERIZATION WITH CORONARY ANGIOGRAM;  Surgeon: Sherren Mocha, MD;  [facility]: [facility] CATH LAB;  Service: Cardiovascular;  Laterality: N/A;    There were no vitals filed for this visit.  Subjective Assessment - 03/30/18 0756    Subjective  No falls. He has not been using cane.     Pertinent History  DM, HTN, HDL    Limitations  Walking;Standing;House hold activities    Patient Stated Goals  "I want to walk without anything"     Currently in Pain?  No/denies      Standing with BUE support on parallel bars with BLEs on BOSU round down: using hips wt shifts ant/post, med/lat & circles, partial squat.  Switched  to round up: step one foot center & hip flexion other LE. PT demo initial of each exercise & pt return demo. Sitting on 85cm therapy ball in front of mat table between 2 chairs for safety: rolling ant/post, right/left & circles, bouncing. PT demo initial of each exercise & pt return demo.                  [facility] Adult PT Treatment/Exercise - 03/30/18 0759      Transfers   Transfers  Sit to Stand;Stand to Sit    Sit to Stand  6: Modified independent (Device/Increase time);With upper extremity assist;With armrests;From chair/3-in-1;Other (comment)    Stand to Sit  6: Modified independent (Device/Increase time);With upper extremity assist;With armrests;To chair/3-in-1;Other (comment)      Ambulation/Gait   Ambulation/Gait  Yes    Ambulation/Gait Assistance  5: Supervision    Ambulation Distance (Feet)  400 Feet    Assistive device  Prostheses;None    Gait Pattern  Step-through pattern;Decreased arm swing - left;Decreased arm swing - right;Decreased stride length;Lateral hip instability;Wide base of support    Stairs  Yes    Stairs Assistance  5: Supervision    Stair Management Technique  Two rails;One rail Left;One rail Right;Alternating pattern;Forwards    Number of  Stairs  4   5 reps   Ramp  5: Supervision   prostheses & cane   Curb  5: Supervision   cane & prostheses     High Level Balance   High Level Balance Activities  --    High Level Balance Comments  --      Prosthetics   Prosthetic Care Comments   use of assistive devices with inclement weather like snow/ice    Current prosthetic wear tolerance (days/week)   daily    Current prosthetic wear tolerance (#hours/day)   most of awake hours, drying limb/liner q5 hrs or signs of sweating.    Residual limb condition   no issues    Education Provided  Other (comment)   see prosthetic care comments   Person(s) Educated  Patient    Education Method  Explanation    Education Method  Verbalized understanding              PT Education - 03/30/18 [phone]    Education Details  Ongoing fitness plan, classes at [facility], use of Swiss ball & BOSU    Person(s) Educated  Patient    Methods  Explanation;Verbal cues;Demonstration    Comprehension  Verbalized understanding;Returned demonstration       PT Short Term Goals - 03/30/18 0913      PT SHORT TERM GOAL #1   Title  Pt will initiate HEP in order to indicate improved strength and decreased fall risk.  (Target Date: Following 3rd visit)    Baseline  MET 03/06/2018    Time  4    Period  Weeks    Status  Achieved      PT SHORT TERM GOAL #2   Title  Pt will ambulate over indoor surfaces x 200' w/ SPC and B prostheses at mod I level in order to indicate safe home negotiation.     Baseline  MET 03/06/2018    Time  4    Period  Weeks    Status  Achieved      PT SHORT TERM GOAL #3   Title  Pt will improve gait speed to 2.62 ft/sec with single SPC in order to indicate more efficient gait and decreased fall risk.     Baseline  MET 03/06/2018 Gait velocity with cane & prostheses 2.98 ft/sec    Time  4    Period  Weeks    Status  Achieved      PT SHORT TERM GOAL #4   Title  Patient ambulates 400' outdoors on paved surfaces with cane & prostheses with supervision. (All updated STGs Target Date: 03/31/2018)    Time  3    Period  Weeks    Status  Achieved      PT SHORT TERM GOAL #5   Title  Patient negotiates ramps & curbs with cane & prostheses with supervision.     Time  3    Period  Weeks    Status  Achieved        PT Long Term Goals - 03/06/18 0907      PT LONG TERM GOAL #1   Title  Patient verbalizes & demonstrates understanding of proper prosthetic care to enable safe use of prostheses. (All LTGs Target Date: 15 visits after evaluation)    Baseline  03/06/2018  Patient requires cues for skin management, adjusting ply socks with limb volume changes and problem solving new issues.     Time  6  Period  Weeks    Status   New    Target Date  04/21/18      PT LONG TERM GOAL #2   Title  Patient tolerates wear of bilateral prostheses >90% of awake hours without skin issues or limb pain to enable function throughout his day.     Baseline  03/06/2018  Patient is wearing prostheses >90% of awake hours but has skin changes on residual limb with potential for breakdown.     Time  6    Period  Weeks    Status  New    Target Date  04/21/18      PT LONG TERM GOAL #3   Title  Berg Balance >45/56 to indicate lower fall risk.     Baseline  03/06/2018: Merrilee Jansky 38/56    Time  6    Period  Weeks    Status  New    Target Date  04/21/18      PT LONG TERM GOAL #4   Title  Dynamic Gait Index >/= 18/24 to indicate lower fall risk    Baseline  03/06/2018  DGI with cane 13/24    Time  6    Period  Weeks    Status  New    Target Date  04/21/18      PT LONG TERM GOAL #5   Title  Patient ambulates 500' outdoors including grass with cane or less & prostheses modified independent for community mobility.     Baseline  03/06/2018 Patient ambulates 300' with cane & prostheses modified indepedent but only on indoor paved surfaces.     Time  6    Period  Weeks    Status  New    Target Date  04/21/18      PT LONG TERM GOAL #6   Title  Patient negotiates ramps, curbs & stairs with single rail with cane & prostheses modified independent for community access.     Baseline  03/06/2018  Patient requires minimal assist on ramps & moderate assist on curbs with cane & prostheses.     Time  6    Period  Weeks    Status  New    Target Date  04/21/18      PT LONG TERM GOAL #7   Title  Patient ambulates around furniture with prostheses only carrying items in both hands modified independent.     Baseline  Patient ambulates without assistive device with prostheses with minimal guard on straight path only & unable to carry items yet.     Time  6    Period  Weeks    Status  New    Target Date  04/21/18            Plan - 03/30/18 0914     Clinical Impression Statement  Patient has better understanding of ongoing fitness options includine weight machines, BOSU, seated on therapy ball & classes at [employer].     Rehab Potential  Good    Clinical Impairments Affecting Rehab Potential  Pt very motivated, limited by MCD visit limitations    PT Frequency  2x / week   then 2x/wk for 6 weeks   PT Duration  6 weeks   then 2x/wk for 6 weeks   PT Treatment/Interventions  ADLs/Self Care Home Management;DME Instruction;Gait training;Stair training;Functional mobility training;Therapeutic activities;Therapeutic exercise;Balance training;Neuromuscular re-education;Patient/family education;Prosthetic Training;Passive range of motion;Energy conservation;Vestibular    PT Next Visit Plan  work towards Power / check  as patient wants to discharge as his Medicaid ends with end of month.     Consulted and Agree with Plan of Care  Patient       Patient will benefit from skilled therapeutic intervention in order to improve the following deficits and impairments:  Abnormal gait, Cardiopulmonary status limiting activity, Decreased activity tolerance, Decreased balance, Decreased endurance, Decreased knowledge of use of DME, Decreased mobility, Decreased strength, Impaired perceived functional ability, Impaired flexibility, Impaired sensation, Postural dysfunction  Visit Diagnosis: Unsteadiness on feet  Other abnormalities of gait and mobility  Muscle weakness (generalized)  Abnormal posture     Problem List Patient Active Problem List   Diagnosis Date Noted  . Diabetic neuropathy ([place]) 10/22/2015  . Chronic systolic heart failure ([place]) 08/24/2011  . HTN (hypertension), malignant 08/14/2011  . Diabetes mellitus type 2, noninsulin dependent ([place]) 08/14/2011    Arul Farabee PT, DPT 03/30/2018, 9:26 AM  [facility] [address] [place], [place], [address] Phone:  [phone]   Fax:  [phone]  Name: Dustin Lozano MRN: [id number] Date of Birth: 1960/05/03

## 2018-04-03 ENCOUNTER — Ambulatory Visit: Payer: Medicaid Other | Admitting: Physical Therapy

## 2018-04-03 ENCOUNTER — Encounter: Payer: Self-pay | Admitting: Physical Therapy

## 2018-04-03 DIAGNOSIS — R2681 Unsteadiness on feet: Secondary | ICD-10-CM

## 2018-04-03 DIAGNOSIS — R293 Abnormal posture: Secondary | ICD-10-CM

## 2018-04-03 DIAGNOSIS — R2689 Other abnormalities of gait and mobility: Secondary | ICD-10-CM

## 2018-04-03 DIAGNOSIS — M6281 Muscle weakness (generalized): Secondary | ICD-10-CM

## 2018-04-03 NOTE — Therapy (Signed)
[facility] [address] [place], [place], [address] Phone: [phone]   Fax:  [phone]  Physical Therapy Treatment  Patient Details  Name: Dustin Lozano MRN: [id number] Date of Birth: [birth year] Referring Provider (PT): Antonieta Pert, FNP   Encounter Date: 04/03/2018  PT End of Session - 04/03/18 0845    Visit Number  11    Number of Visits  16    Authorization Type  Medicaid    Authorization Time Period  3 visits 02/20/2018 - 03/12/2018,  12 visits 03/13/2018 - 04/23/2018    Authorization - Visit Number  7    Authorization - Number of Visits  12    PT Start Time  0758    PT Stop Time  0843    PT Time Calculation (min)  45 min    Equipment Utilized During Treatment  Gait belt    Activity Tolerance  Patient tolerated treatment well    Behavior During Therapy  [facility] for tasks assessed/performed       Past Medical History:  Diagnosis Date  . CHF (congestive heart failure) (HCC)    Systolic - LVEF 29-47  (65/4650)   . Coronary artery disease    Nonobstructive disease per cath 08/2011  . Diabetes mellitus   . HTN (hypertension)     Past Surgical History:  Procedure Laterality Date  . APPENDECTOMY    . LEFT AND RIGHT HEART CATHETERIZATION WITH CORONARY ANGIOGRAM N/A 08/17/2011   Procedure: LEFT AND RIGHT HEART CATHETERIZATION WITH CORONARY ANGIOGRAM;  Surgeon: Sherren Mocha, MD;  Location: [facility] CATH LAB;  Service: Cardiovascular;  Laterality: N/A;    There were no vitals filed for this visit.  Subjective Assessment - 04/03/18 0758    Subjective  He has not recieved any notice that his Medicaid is being stopped this month.  He has an appointment with Ronalee Belts, [facility] this Thursday.     Pertinent History  DM, HTN, HDL    Limitations  Walking;Standing;House hold activities    Patient Stated Goals  "I want to walk without anything"     Currently in Pain?  No/denies                       [facility] Adult PT  Treatment/Exercise - 04/03/18 1214      Transfers   Transfers  Sit to Stand;Stand to Sit    Sit to Stand  5: Supervision;With upper extremity assist;From chair/3-in-1;Other (comment)   inside bars without support   Sit to Stand Details (indicate cue type and reason)  worked on arising & initiating gait with minimal to no hesitancy    Stand to Sit  5: Supervision;With upper extremity assist;To chair/3-in-1      Ambulation/Gait   Ambulation/Gait  Yes    Ambulation/Gait Assistance  5: Supervision    Ambulation Distance (Feet)  400 Feet    Assistive device  Prostheses;None    Gait Pattern  Step-through pattern;Decreased arm swing - left;Decreased arm swing - right;Decreased stride length;Lateral hip instability;Wide base of support    Ambulation Surface  Indoor;Level    Stairs  Yes    Stairs Assistance  5: Supervision    Stair Management Technique  Two rails;One rail Left;One rail Right;Alternating pattern;Forwards    Number of Stairs  4   5 reps   Ramp  --    Curb  --      Prosthetics   Prosthetic Care Comments   --  Current prosthetic wear tolerance (days/week)   daily    Current prosthetic wear tolerance (#hours/day)   most of awake hours, drying limb/liner q5 hrs or signs of sweating.    Residual limb condition   no issues    Education Provided  --          Balance Exercises - 04/03/18 0800      Balance Exercises: Standing   Standing Eyes Opened  Wide (BOA);Foam/compliant surface;5 reps;Head turns   up/down, right/left & diagonals   Stepping Strategy  Anterior;Posterior;Lateral;5 reps   alt stepping off/stabilization anticipatory & randomly   Rockerboard  Anterior/posterior;Lateral;EO;Head turns;10 reps;Intermittent UE support   wt shifts, stabilization, recovery   Step Ups  Forward;Lateral;4 inch;Intermittent UE support   on/off Airex mat    Gait with Head Turns  Forward;5 reps    Other Standing Exercises  BOSU BLE stance pelvic motions ant/post, right/left &  circles;  round side up foot center & contralateral hip flexion  5 reps ea LE          PT Short Term Goals - 03/30/18 0913      PT SHORT TERM GOAL #1   Title  Pt will initiate HEP in order to indicate improved strength and decreased fall risk.  (Target Date: Following 3rd visit)    Baseline  MET 03/06/2018    Time  4    Period  Weeks    Status  Achieved      PT SHORT TERM GOAL #2   Title  Pt will ambulate over indoor surfaces x 200' w/ SPC and B prostheses at mod I level in order to indicate safe home negotiation.     Baseline  MET 03/06/2018    Time  4    Period  Weeks    Status  Achieved      PT SHORT TERM GOAL #3   Title  Pt will improve gait speed to 2.62 ft/sec with single SPC in order to indicate more efficient gait and decreased fall risk.     Baseline  MET 03/06/2018 Gait velocity with cane & prostheses 2.98 ft/sec    Time  4    Period  Weeks    Status  Achieved      PT SHORT TERM GOAL #4   Title  Patient ambulates 400' outdoors on paved surfaces with cane & prostheses with supervision. (All updated STGs Target Date: 03/31/2018)    Time  3    Period  Weeks    Status  Achieved      PT SHORT TERM GOAL #5   Title  Patient negotiates ramps & curbs with cane & prostheses with supervision.     Time  3    Period  Weeks    Status  Achieved        PT Long Term Goals - 03/06/18 0907      PT LONG TERM GOAL #1   Title  Patient verbalizes & demonstrates understanding of proper prosthetic care to enable safe use of prostheses. (All LTGs Target Date: 15 visits after evaluation)    Baseline  03/06/2018  Patient requires cues for skin management, adjusting ply socks with limb volume changes and problem solving new issues.     Time  6    Period  Weeks    Status  New    Target Date  04/21/18      PT LONG TERM GOAL #2   Title  Patient tolerates wear of bilateral prostheses >90% of  awake hours without skin issues or limb pain to enable function throughout his day.     Baseline   03/06/2018  Patient is wearing prostheses >90% of awake hours but has skin changes on residual limb with potential for breakdown.     Time  6    Period  Weeks    Status  New    Target Date  04/21/18      PT LONG TERM GOAL #3   Title  Berg Balance >45/56 to indicate lower fall risk.     Baseline  03/06/2018: Merrilee Jansky 38/56    Time  6    Period  Weeks    Status  New    Target Date  04/21/18      PT LONG TERM GOAL #4   Title  Dynamic Gait Index >/= 18/24 to indicate lower fall risk    Baseline  03/06/2018  DGI with cane 13/24    Time  6    Period  Weeks    Status  New    Target Date  04/21/18      PT LONG TERM GOAL #5   Title  Patient ambulates 500' outdoors including grass with cane or less & prostheses modified independent for community mobility.     Baseline  03/06/2018 Patient ambulates 300' with cane & prostheses modified indepedent but only on indoor paved surfaces.     Time  6    Period  Weeks    Status  New    Target Date  04/21/18      PT LONG TERM GOAL #6   Title  Patient negotiates ramps, curbs & stairs with single rail with cane & prostheses modified independent for community access.     Baseline  03/06/2018  Patient requires minimal assist on ramps & moderate assist on curbs with cane & prostheses.     Time  6    Period  Weeks    Status  New    Target Date  04/21/18      PT LONG TERM GOAL #7   Title  Patient ambulates around furniture with prostheses only carrying items in both hands modified independent.     Baseline  Patient ambulates without assistive device with prostheses with minimal guard on straight path only & unable to carry items yet.     Time  6    Period  Weeks    Status  New    Target Date  04/21/18            Plan - 04/03/18 1205    Clinical Impression Statement  Today's skilled PT session focused on advanced balance activities including facilitating limb/knee (ankle), hip & step strategy.     Rehab Potential  Good    Clinical Impairments  Affecting Rehab Potential  Pt very motivated, limited by MCD visit limitations    PT Frequency  2x / week   then 2x/wk for 6 weeks   PT Duration  6 weeks   then 2x/wk for 6 weeks   PT Treatment/Interventions  ADLs/Self Care Home Management;DME Instruction;Gait training;Stair training;Functional mobility training;Therapeutic activities;Therapeutic exercise;Balance training;Neuromuscular re-education;Patient/family education;Prosthetic Training;Passive range of motion;Energy conservation;Vestibular    PT Next Visit Plan  check if his Medicaid will end with February, is so check LTGs. If not, continue to work on advanced balance & gait.     Consulted and Agree with Plan of Care  Patient       Patient will benefit from skilled therapeutic intervention in  order to improve the following deficits and impairments:  Abnormal gait, Cardiopulmonary status limiting activity, Decreased activity tolerance, Decreased balance, Decreased endurance, Decreased knowledge of use of DME, Decreased mobility, Decreased strength, Impaired perceived functional ability, Impaired flexibility, Impaired sensation, Postural dysfunction  Visit Diagnosis: Unsteadiness on feet  Other abnormalities of gait and mobility  Muscle weakness (generalized)  Abnormal posture     Problem List Patient Active Problem List   Diagnosis Date Noted  . Diabetic neuropathy ([place]) 10/22/2015  . Chronic systolic heart failure ([place]) 08/24/2011  . HTN (hypertension), malignant 08/14/2011  . Diabetes mellitus type 2, noninsulin dependent ([place]) 08/14/2011    Yoshino Broccoli  PT, DPT 04/03/2018, 12:17 PM  [facility] [address] [place], [place], [address] Phone: [phone]   Fax:  [phone]  Name: Dustin Lozano MRN: [id number] Date of Birth: [birth year]

## 2018-04-06 ENCOUNTER — Ambulatory Visit: Payer: Medicaid Other | Admitting: Physical Therapy

## 2018-04-06 ENCOUNTER — Encounter: Payer: Self-pay | Admitting: Physical Therapy

## 2018-04-06 DIAGNOSIS — R2681 Unsteadiness on feet: Secondary | ICD-10-CM | POA: Diagnosis not present

## 2018-04-06 DIAGNOSIS — R2689 Other abnormalities of gait and mobility: Secondary | ICD-10-CM

## 2018-04-06 DIAGNOSIS — M6281 Muscle weakness (generalized): Secondary | ICD-10-CM

## 2018-04-06 DIAGNOSIS — R293 Abnormal posture: Secondary | ICD-10-CM

## 2018-04-06 NOTE — Therapy (Signed)
[facility] [address] [place], [place], [address] Phone: [phone]   Fax:  [phone]  Physical Therapy Treatment  Patient Details  Name: Dustin Lozano MRN: [id number] Date of Birth: [birth year] Referring Provider (PT): Antonieta Pert, FNP   Encounter Date: 04/06/2018  PT End of Session - 04/06/18 1410    Visit Number  12    Number of Visits  16    Authorization Type  Medicaid    Authorization Time Period  3 visits 02/20/2018 - 03/12/2018,  12 visits 03/13/2018 - 04/23/2018    Authorization - Visit Number  8    Authorization - Number of Visits  12    PT Start Time  0845    PT Stop Time  0930    PT Time Calculation (min)  45 min    Equipment Utilized During Treatment  Gait belt    Activity Tolerance  Patient tolerated treatment well    Behavior During Therapy  [facility] for tasks assessed/performed       Past Medical History:  Diagnosis Date  . CHF (congestive heart failure) (HCC)    Systolic - LVEF 16-38  (45/3646)   . Coronary artery disease    Nonobstructive disease per cath 08/2011  . Diabetes mellitus   . HTN (hypertension)     Past Surgical History:  Procedure Laterality Date  . APPENDECTOMY    . LEFT AND RIGHT HEART CATHETERIZATION WITH CORONARY ANGIOGRAM N/A 08/17/2011   Procedure: LEFT AND RIGHT HEART CATHETERIZATION WITH CORONARY ANGIOGRAM;  Surgeon: Sherren Mocha, MD;  Location: [facility] CATH LAB;  Service: Cardiovascular;  Laterality: N/A;    There were no vitals filed for this visit.  Subjective Assessment - 04/06/18 0845    Subjective  He went to fitness room at his complex and did workout. No falls. He has not heard from [facility].     Pertinent History  DM, HTN, HDL    Limitations  Walking;Standing;House hold activities    Patient Stated Goals  "I want to walk without anything"     Currently in Pain?  No/denies       1. Standing with one foot on 2" block or book: 1) move other leg forward hold 2  seconds & back hold 2 seconds.  Repeat other leg. 10 reps ea LE  2) kick heel out to side hold 2 seconds, cross foot behind leg hold 2 seconds, kick out to side, cross foot in front. 10 reps ea LE  2.stand on foam head movements 1)right/left 2)up/down 3&4) diagonals             Progress to stepping off/on foam 1)forwards 2) backwards  3)right 4)left  3.stand on treadmill with 7% incline Make sure you stay close to bars to use for support with balance loss: face uphill 1)alternate stepping forward & back to feet beside each other  2)alternate stepping backwards & feet side by side Repeat facing downhill  Side stepping & crossovers with intermittent UE support.                           PT Short Term Goals - 03/30/18 0913      PT SHORT TERM GOAL #1   Title  Pt will initiate HEP in order to indicate improved strength and decreased fall risk.  (Target Date: Following 3rd visit)    Baseline  MET 03/06/2018    Time  4    Period  Weeks    Status  Achieved      PT SHORT TERM GOAL #2   Title  Pt will ambulate over indoor surfaces x 200' w/ SPC and B prostheses at mod I level in order to indicate safe home negotiation.     Baseline  MET 03/06/2018    Time  4    Period  Weeks    Status  Achieved      PT SHORT TERM GOAL #3   Title  Pt will improve gait speed to 2.62 ft/sec with single SPC in order to indicate more efficient gait and decreased fall risk.     Baseline  MET 03/06/2018 Gait velocity with cane & prostheses 2.98 ft/sec    Time  4    Period  Weeks    Status  Achieved      PT SHORT TERM GOAL #4   Title  Patient ambulates 400' outdoors on paved surfaces with cane & prostheses with supervision. (All updated STGs Target Date: 03/31/2018)    Time  3    Period  Weeks    Status  Achieved      PT SHORT TERM GOAL #5   Title  Patient negotiates ramps & curbs with cane & prostheses with supervision.     Time  3    Period  Weeks    Status  Achieved        PT  Long Term Goals - 03/06/18 0907      PT LONG TERM GOAL #1   Title  Patient verbalizes & demonstrates understanding of proper prosthetic care to enable safe use of prostheses. (All LTGs Target Date: 15 visits after evaluation)    Baseline  03/06/2018  Patient requires cues for skin management, adjusting ply socks with limb volume changes and problem solving new issues.     Time  6    Period  Weeks    Status  New    Target Date  04/21/18      PT LONG TERM GOAL #2   Title  Patient tolerates wear of bilateral prostheses >90% of awake hours without skin issues or limb pain to enable function throughout his day.     Baseline  03/06/2018  Patient is wearing prostheses >90% of awake hours but has skin changes on residual limb with potential for breakdown.     Time  6    Period  Weeks    Status  New    Target Date  04/21/18      PT LONG TERM GOAL #3   Title  Berg Balance >45/56 to indicate lower fall risk.     Baseline  03/06/2018: Merrilee Jansky 38/56    Time  6    Period  Weeks    Status  New    Target Date  04/21/18      PT LONG TERM GOAL #4   Title  Dynamic Gait Index >/= 18/24 to indicate lower fall risk    Baseline  03/06/2018  DGI with cane 13/24    Time  6    Period  Weeks    Status  New    Target Date  04/21/18      PT LONG TERM GOAL #5   Title  Patient ambulates 500' outdoors including grass with cane or less & prostheses modified independent for community mobility.     Baseline  03/06/2018 Patient ambulates 300' with cane & prostheses modified indepedent but only on indoor paved surfaces.  Time  6    Period  Weeks    Status  New    Target Date  04/21/18      PT LONG TERM GOAL #6   Title  Patient negotiates ramps, curbs & stairs with single rail with cane & prostheses modified independent for community access.     Baseline  03/06/2018  Patient requires minimal assist on ramps & moderate assist on curbs with cane & prostheses.     Time  6    Period  Weeks    Status  New    Target  Date  04/21/18      PT LONG TERM GOAL #7   Title  Patient ambulates around furniture with prostheses only carrying items in both hands modified independent.     Baseline  Patient ambulates without assistive device with prostheses with minimal guard on straight path only & unable to carry items yet.     Time  6    Period  Weeks    Status  New    Target Date  04/21/18            Plan - 04/06/18 1648    Clinical Impression Statement  PT advanced exercises / activities to facilitate balance reactions. Patient appears to understand new exercises.     Rehab Potential  Good    Clinical Impairments Affecting Rehab Potential  Pt very motivated, limited by MCD visit limitations    PT Frequency  2x / week   then 2x/wk for 6 weeks   PT Duration  6 weeks   then 2x/wk for 6 weeks   PT Treatment/Interventions  ADLs/Self Care Home Management;DME Instruction;Gait training;Stair training;Functional mobility training;Therapeutic activities;Therapeutic exercise;Balance training;Neuromuscular re-education;Patient/family education;Prosthetic Training;Passive range of motion;Energy conservation;Vestibular    PT Next Visit Plan  work towards [employer] and Agree with Plan of Care  Patient       Patient will benefit from skilled therapeutic intervention in order to improve the following deficits and impairments:  Abnormal gait, Cardiopulmonary status limiting activity, Decreased activity tolerance, Decreased balance, Decreased endurance, Decreased knowledge of use of DME, Decreased mobility, Decreased strength, Impaired perceived functional ability, Impaired flexibility, Impaired sensation, Postural dysfunction  Visit Diagnosis: Unsteadiness on feet  Other abnormalities of gait and mobility  Muscle weakness (generalized)  Abnormal posture     Problem List Patient Active Problem List   Diagnosis Date Noted  . Diabetic neuropathy ([place]) 10/22/2015  . Chronic systolic heart failure ([place])  08/24/2011  . HTN (hypertension), malignant 08/14/2011  . Diabetes mellitus type 2, noninsulin dependent ([place]) 08/14/2011    Tomy Khim PT, DPT 04/06/2018, 4:50 PM  [facility] [address] [place], [place], [address] Phone: [phone]   Fax:  [phone]  Name: Dustin Lozano MRN: [id number] Date of Birth: 1960/06/21

## 2018-04-06 NOTE — Patient Instructions (Addendum)
1. Standing with one foot on 2" block or book: 1) move other leg forward hold 2 seconds & back hold 2 seconds. Repeat other leg.  2) kick heel out to side hold 2 seconds, cross foot behind leg hold 2 seconds, kick out to side, cross foot in front.   2.stand on foam head movements 1)right/left 2)up/down 3&4) diagonals  Progress to stepping off/on foam 1)forwards 2) backwards  3)right 4)left  3.stand on treadmill with 7% incline Make sure you stay close to bars to use for support with balance loss: face uphill 1)alternate stepping forward & back to feet beside each other  2)alternate stepping backwards & feet side by side Repeat facing downhill

## 2018-04-10 ENCOUNTER — Ambulatory Visit: Payer: Medicaid Other | Attending: Nurse Practitioner | Admitting: Physical Therapy

## 2018-04-10 ENCOUNTER — Encounter: Payer: Self-pay | Admitting: Physical Therapy

## 2018-04-10 DIAGNOSIS — R293 Abnormal posture: Secondary | ICD-10-CM

## 2018-04-10 DIAGNOSIS — R2689 Other abnormalities of gait and mobility: Secondary | ICD-10-CM | POA: Insufficient documentation

## 2018-04-10 DIAGNOSIS — M6281 Muscle weakness (generalized): Secondary | ICD-10-CM | POA: Diagnosis present

## 2018-04-10 DIAGNOSIS — R2681 Unsteadiness on feet: Secondary | ICD-10-CM | POA: Insufficient documentation

## 2018-04-10 NOTE — Therapy (Signed)
[facility] [address] [place], [place], [address] Phone: [phone]   Fax:  [phone]  Physical Therapy Treatment  Patient Details  Name: Dustin Lozano MRN: [id number] Date of Birth: 07-03-1960 Referring Provider (PT): Antonieta Pert, FNP   Encounter Date: 04/10/2018  PT End of Session - 04/10/18 0843    Visit Number  13    Number of Visits  16    Authorization Type  Medicaid    Authorization Time Period  3 visits 02/20/2018 - 03/12/2018,  12 visits 03/13/2018 - 04/23/2018    Authorization - Visit Number  9    Authorization - Number of Visits  12    PT Start Time  0800    PT Stop Time  0843    PT Time Calculation (min)  43 min    Equipment Utilized During Treatment  Gait belt    Activity Tolerance  Patient tolerated treatment well    Behavior During Therapy  [facility] for tasks assessed/performed       Past Medical History:  Diagnosis Date  . CHF (congestive heart failure) (HCC)    Systolic - LVEF 11-57  (26/2035)   . Coronary artery disease    Nonobstructive disease per cath 08/2011  . Diabetes mellitus   . HTN (hypertension)     Past Surgical History:  Procedure Laterality Date  . APPENDECTOMY    . LEFT AND RIGHT HEART CATHETERIZATION WITH CORONARY ANGIOGRAM N/A 08/17/2011   Procedure: LEFT AND RIGHT HEART CATHETERIZATION WITH CORONARY ANGIOGRAM;  Surgeon: Sherren Mocha, MD;  Location: [facility] CATH LAB;  Service: Cardiovascular;  Laterality: N/A;    There were no vitals filed for this visit.  Subjective Assessment - 04/10/18 0800    Subjective  He has found standing still is impaired.    Pertinent History  DM, HTN, HDL    Limitations  Walking;Standing;House hold activities    Patient Stated Goals  "I want to walk without anything"     Currently in Pain?  No/denies                       [facility] Adult PT Treatment/Exercise - 04/10/18 0800      Transfers   Transfers  Sit to Stand;Stand to Sit    Sit to Stand  5: Supervision;With upper extremity assist;From chair/3-in-1;Other (comment)   inside bars without support   Stand to Sit  5: Supervision;With upper extremity assist;To chair/3-in-1      Ambulation/Gait   Ambulation/Gait  Yes    Ambulation/Gait Assistance  5: Supervision    Ambulation/Gait Assistance Details  visual & verbal cues on step width 2-4" & weight shift over stance limb    Ambulation Distance (Feet)  400 Feet    Assistive device  Prostheses;None    Gait Pattern  Step-through pattern;Decreased arm swing - left;Decreased arm swing - right;Decreased stride length;Lateral hip instability;Wide base of support    Ambulation Surface  Indoor;Level    Stairs  --    Stairs Assistance  --    Stair Management Technique  --    Number of Stairs  --      Neuro Re-ed    Neuro Re-ed Details   standing stationary with wide stance with feet offset to increase base of support with bilateral TTA prostheses      Knee/Hip Exercises: Machines for Strengthening   Cybex Leg Press  BLE 80# 15 reps, RLE & LLE 50# 15 reps, BLEs 90# 15  reps      Prosthetics   Current prosthetic wear tolerance (days/week)   daily    Current prosthetic wear tolerance (#hours/day)   most of awake hours, drying limb/liner q5 hrs or signs of sweating.    Residual limb condition   no issues          Balance Exercises - 04/10/18 0800      Balance Exercises: Standing   Standing Eyes Opened  Wide (BOA);5 reps;Head turns   up/down, right/left & diagonals, standing on 2X4 foam beam   Stepping Strategy  Anterior;Posterior;Lateral;5 reps   alt stepping off/stabilization anticipatory & randomly   Rockerboard  Anterior/posterior;Lateral;EO;Head turns;10 reps;Intermittent UE support   wt shifts, stabilization, recovery   Gait with Head Turns  Forward;5 reps          PT Short Term Goals - 03/30/18 0913      PT SHORT TERM GOAL #1   Title  Pt will initiate HEP in order to indicate improved strength and  decreased fall risk.  (Target Date: Following 3rd visit)    Baseline  MET 03/06/2018    Time  4    Period  Weeks    Status  Achieved      PT SHORT TERM GOAL #2   Title  Pt will ambulate over indoor surfaces x 200' w/ SPC and B prostheses at mod I level in order to indicate safe home negotiation.     Baseline  MET 03/06/2018    Time  4    Period  Weeks    Status  Achieved      PT SHORT TERM GOAL #3   Title  Pt will improve gait speed to 2.62 ft/sec with single SPC in order to indicate more efficient gait and decreased fall risk.     Baseline  MET 03/06/2018 Gait velocity with cane & prostheses 2.98 ft/sec    Time  4    Period  Weeks    Status  Achieved      PT SHORT TERM GOAL #4   Title  Patient ambulates 400' outdoors on paved surfaces with cane & prostheses with supervision. (All updated STGs Target Date: 03/31/2018)    Time  3    Period  Weeks    Status  Achieved      PT SHORT TERM GOAL #5   Title  Patient negotiates ramps & curbs with cane & prostheses with supervision.     Time  3    Period  Weeks    Status  Achieved        PT Long Term Goals - 03/06/18 0907      PT LONG TERM GOAL #1   Title  Patient verbalizes & demonstrates understanding of proper prosthetic care to enable safe use of prostheses. (All LTGs Target Date: 15 visits after evaluation)    Baseline  03/06/2018  Patient requires cues for skin management, adjusting ply socks with limb volume changes and problem solving new issues.     Time  6    Period  Weeks    Status  New    Target Date  04/21/18      PT LONG TERM GOAL #2   Title  Patient tolerates wear of bilateral prostheses >90% of awake hours without skin issues or limb pain to enable function throughout his day.     Baseline  03/06/2018  Patient is wearing prostheses >90% of awake hours but has skin changes on residual limb with potential for  breakdown.     Time  6    Period  Weeks    Status  New    Target Date  04/21/18      PT LONG TERM GOAL #3    Title  Berg Balance >45/56 to indicate lower fall risk.     Baseline  03/06/2018: Merrilee Jansky 38/56    Time  6    Period  Weeks    Status  New    Target Date  04/21/18      PT LONG TERM GOAL #4   Title  Dynamic Gait Index >/= 18/24 to indicate lower fall risk    Baseline  03/06/2018  DGI with cane 13/24    Time  6    Period  Weeks    Status  New    Target Date  04/21/18      PT LONG TERM GOAL #5   Title  Patient ambulates 500' outdoors including grass with cane or less & prostheses modified independent for community mobility.     Baseline  03/06/2018 Patient ambulates 300' with cane & prostheses modified indepedent but only on indoor paved surfaces.     Time  6    Period  Weeks    Status  New    Target Date  04/21/18      PT LONG TERM GOAL #6   Title  Patient negotiates ramps, curbs & stairs with single rail with cane & prostheses modified independent for community access.     Baseline  03/06/2018  Patient requires minimal assist on ramps & moderate assist on curbs with cane & prostheses.     Time  6    Period  Weeks    Status  New    Target Date  04/21/18      PT LONG TERM GOAL #7   Title  Patient ambulates around furniture with prostheses only carrying items in both hands modified independent.     Baseline  Patient ambulates without assistive device with prostheses with minimal guard on straight path only & unable to carry items yet.     Time  6    Period  Weeks    Status  New    Target Date  04/21/18            Plan - 04/10/18 2028    Clinical Impression Statement  Today's skilled session focused on leg press BLEs & single LEs for strength, balance reactions / training to facilitate residual limb/knee, hip & step strategies and gait with normal base to decrease lateral weight shifts.     Rehab Potential  Good    Clinical Impairments Affecting Rehab Potential  Pt very motivated, limited by MCD visit limitations    PT Frequency  2x / week   then 2x/wk for 6 weeks   PT  Duration  6 weeks   then 2x/wk for 6 weeks   PT Treatment/Interventions  ADLs/Self Care Home Management;DME Instruction;Gait training;Stair training;Functional mobility training;Therapeutic activities;Therapeutic exercise;Balance training;Neuromuscular re-education;Patient/family education;Prosthetic Training;Passive range of motion;Energy conservation;Vestibular    PT Next Visit Plan  work towards [employer] and Agree with Plan of Care  Patient       Patient will benefit from skilled therapeutic intervention in order to improve the following deficits and impairments:  Abnormal gait, Cardiopulmonary status limiting activity, Decreased activity tolerance, Decreased balance, Decreased endurance, Decreased knowledge of use of DME, Decreased mobility, Decreased strength, Impaired perceived functional ability, Impaired flexibility, Impaired sensation, Postural dysfunction  Visit Diagnosis: Unsteadiness on feet  Other abnormalities of gait and mobility  Muscle weakness (generalized)  Abnormal posture     Problem List Patient Active Problem List   Diagnosis Date Noted  . Diabetic neuropathy ([place]) 10/22/2015  . Chronic systolic heart failure ([place]) 08/24/2011  . HTN (hypertension), malignant 08/14/2011  . Diabetes mellitus type 2, noninsulin dependent ([place]) 08/14/2011    Kyliee Ortego PT, DPT 04/10/2018, 8:32 PM  [facility] [address] [place], [place], [address] Phone: [phone]   Fax:  [phone]  Name: Dustin Lozano MRN: [id number] Date of Birth: March 10, 1960

## 2018-04-13 ENCOUNTER — Encounter: Payer: Self-pay | Admitting: Physical Therapy

## 2018-04-13 ENCOUNTER — Ambulatory Visit: Payer: Medicaid Other | Admitting: Physical Therapy

## 2018-04-13 DIAGNOSIS — R2681 Unsteadiness on feet: Secondary | ICD-10-CM | POA: Diagnosis not present

## 2018-04-13 DIAGNOSIS — M6281 Muscle weakness (generalized): Secondary | ICD-10-CM

## 2018-04-13 DIAGNOSIS — R2689 Other abnormalities of gait and mobility: Secondary | ICD-10-CM

## 2018-04-13 DIAGNOSIS — R293 Abnormal posture: Secondary | ICD-10-CM

## 2018-04-13 NOTE — Patient Instructions (Signed)
Access Code: [email or web address]  URL: [email or web address]  Date: 04/13/2018  Prepared by: Jamey Reas   Exercises  Standing Hip Flexion with Resistance - 10 reps - 1 sets - 1x daily - 7x weekly  Standing Hip Extension with Resistance - 10 reps - 1 sets - 1x daily - 7x weekly  Standing Hip Adduction with Resistance - 10 reps - 1 sets - 1x daily - 7x weekly  Standing Hip Abduction with Theraband Resistance - 10 reps - 1 sets - 1x daily - 7x weekly  Alternating Punch with Resistance - 10 reps - 1 sets - 5 seconds hold - 1x daily - 5x weekly  Standing Scapular Protraction with Resistance - 10 reps - 1 sets - 5 seconds hold - 1x daily - 7x weekly  Standing Row with Anchored Resistance - 10 reps - 1 sets - 5 seconds hold - 1x daily - 7x weekly  Standing alternate rows with resistance - 10 reps - 1 sets - 5 seconds hold - 1x daily - 7x weekly

## 2018-04-13 NOTE — Therapy (Signed)
[facility] [address] [place], [place], [address] Phone: [phone]   Fax:  [phone]  Physical Therapy Treatment  Patient Details  Name: Dustin Lozano MRN: [id number] Date of Birth: 23-Jan-1961 Referring Provider (PT): Antonieta Pert, FNP   Encounter Date: 04/13/2018  PT End of Session - 04/13/18 0958    Visit Number  14    Number of Visits  16    Authorization Type  Medicaid    Authorization Time Period  3 visits 02/20/2018 - 03/12/2018,  12 visits 03/13/2018 - 04/23/2018    Authorization - Visit Number  10    Authorization - Number of Visits  12    PT Start Time  0800    PT Stop Time  0845    PT Time Calculation (min)  45 min    Equipment Utilized During Treatment  Gait belt    Activity Tolerance  Patient tolerated treatment well    Behavior During Therapy  [facility] for tasks assessed/performed       Past Medical History:  Diagnosis Date  . CHF (congestive heart failure) (HCC)    Systolic - LVEF 76-19  (50/9326)   . Coronary artery disease    Nonobstructive disease per cath 08/2011  . Diabetes mellitus   . HTN (hypertension)     Past Surgical History:  Procedure Laterality Date  . APPENDECTOMY    . LEFT AND RIGHT HEART CATHETERIZATION WITH CORONARY ANGIOGRAM N/A 08/17/2011   Procedure: LEFT AND RIGHT HEART CATHETERIZATION WITH CORONARY ANGIOGRAM;  Surgeon: Sherren Mocha, MD;  Location: [facility] CATH LAB;  Service: Cardiovascular;  Laterality: N/A;    There were no vitals filed for this visit.  Subjective Assessment - 04/13/18 0800    Subjective  He walked a lot yesterday and took breaks as needed. No issues with limbs. He is going back to work in a few weeks.     Pertinent History  DM, HTN, HDL    Limitations  Walking;Standing;House hold activities    Patient Stated Goals  "I want to walk without anything"     Currently in Pain?  No/denies                       [facility] Adult PT Treatment/Exercise -  04/13/18 0800      Transfers   Transfers  Sit to Stand;Stand to Sit;Floor to Transfer    Sit to Stand  6: Modified independent (Device/Increase time);With upper extremity assist;With armrests;From chair/3-in-1    Stand to Sit  6: Modified independent (Device/Increase time);With upper extremity assist;With armrests;To chair/3-in-1    Floor to Transfer  5: Supervision;With upper extremity assist      Ambulation/Gait   Ambulation/Gait  Yes    Ambulation/Gait Assistance  5: Supervision    Ambulation/Gait Assistance Details  Left prosthesis did not click suspension pin. Left prosthesis fell off and patient fell onto residual limb    Assistive device  Prostheses;None      Neuro Re-ed    Neuro Re-ed Details   standing with intermittent UE support: green theraband alternating & BUE rows, shoulder extension with straight arm, forward punch & shoulder flexion with straight arm.      Prosthetics   Prosthetic Care Comments   PT demo & instructed in ice massage to residual limb. PT recommended 2-3 times per day for 5 minutes.     Current prosthetic wear tolerance (days/week)   daily    Current prosthetic wear tolerance (#hours/day)  most of awake hours, drying limb/liner q5 hrs or signs of sweating.    Residual limb condition   no issues. He fell during PT session onto left limb with pain with weight bearing.     Education Provided  Skin check;Residual limb care;Proper wear schedule/adjustment    Person(s) Educated  Patient    Education Method  Explanation;Demonstration;Verbal cues    Education Method  Verbalized understanding    Donning Prosthesis  Modified independent (device/increased time)    Doffing Prosthesis  Modified independent (device/increased time)             PT Education - 04/13/18 0845    Education Details  added HEP green theraband UE motions & red therband hip kicks    Person(s) Educated  Patient    Methods  Explanation;Demonstration;Tactile cues;Verbal cues;Handout     Comprehension  Verbalized understanding;Returned demonstration;Need further instruction       PT Short Term Goals - 03/30/18 0913      PT SHORT TERM GOAL #1   Title  Pt will initiate HEP in order to indicate improved strength and decreased fall risk.  (Target Date: Following 3rd visit)    Baseline  MET 03/06/2018    Time  4    Period  Weeks    Status  Achieved      PT SHORT TERM GOAL #2   Title  Pt will ambulate over indoor surfaces x 200' w/ SPC and B prostheses at mod I level in order to indicate safe home negotiation.     Baseline  MET 03/06/2018    Time  4    Period  Weeks    Status  Achieved      PT SHORT TERM GOAL #3   Title  Pt will improve gait speed to 2.62 ft/sec with single SPC in order to indicate more efficient gait and decreased fall risk.     Baseline  MET 03/06/2018 Gait velocity with cane & prostheses 2.98 ft/sec    Time  4    Period  Weeks    Status  Achieved      PT SHORT TERM GOAL #4   Title  Patient ambulates 400' outdoors on paved surfaces with cane & prostheses with supervision. (All updated STGs Target Date: 03/31/2018)    Time  3    Period  Weeks    Status  Achieved      PT SHORT TERM GOAL #5   Title  Patient negotiates ramps & curbs with cane & prostheses with supervision.     Time  3    Period  Weeks    Status  Achieved        PT Long Term Goals - 03/06/18 0907      PT LONG TERM GOAL #1   Title  Patient verbalizes & demonstrates understanding of proper prosthetic care to enable safe use of prostheses. (All LTGs Target Date: 15 visits after evaluation)    Baseline  03/06/2018  Patient requires cues for skin management, adjusting ply socks with limb volume changes and problem solving new issues.     Time  6    Period  Weeks    Status  New    Target Date  04/21/18      PT LONG TERM GOAL #2   Title  Patient tolerates wear of bilateral prostheses >90% of awake hours without skin issues or limb pain to enable function throughout his day.      Baseline  03/06/2018  Patient  is wearing prostheses >90% of awake hours but has skin changes on residual limb with potential for breakdown.     Time  6    Period  Weeks    Status  New    Target Date  04/21/18      PT LONG TERM GOAL #3   Title  Berg Balance >45/56 to indicate lower fall risk.     Baseline  03/06/2018: Merrilee Jansky 38/56    Time  6    Period  Weeks    Status  New    Target Date  04/21/18      PT LONG TERM GOAL #4   Title  Dynamic Gait Index >/= 18/24 to indicate lower fall risk    Baseline  03/06/2018  DGI with cane 13/24    Time  6    Period  Weeks    Status  New    Target Date  04/21/18      PT LONG TERM GOAL #5   Title  Patient ambulates 500' outdoors including grass with cane or less & prostheses modified independent for community mobility.     Baseline  03/06/2018 Patient ambulates 300' with cane & prostheses modified indepedent but only on indoor paved surfaces.     Time  6    Period  Weeks    Status  New    Target Date  04/21/18      PT LONG TERM GOAL #6   Title  Patient negotiates ramps, curbs & stairs with single rail with cane & prostheses modified independent for community access.     Baseline  03/06/2018  Patient requires minimal assist on ramps & moderate assist on curbs with cane & prostheses.     Time  6    Period  Weeks    Status  New    Target Date  04/21/18      PT LONG TERM GOAL #7   Title  Patient ambulates around furniture with prostheses only carrying items in both hands modified independent.     Baseline  Patient ambulates without assistive device with prostheses with minimal guard on straight path only & unable to carry items yet.     Time  6    Period  Weeks    Status  New    Target Date  04/21/18            Plan - 04/13/18 1712    Clinical Impression Statement  Patient fell today when left prosthesis came off due to suspension pin not engaged. Patient appears to have possible contusion on left limb from fall today. Patient seems to  understand new exercises instructed today.     Rehab Potential  Good    Clinical Impairments Affecting Rehab Potential  Pt very motivated, limited by MCD visit limitations    PT Frequency  2x / week   then 2x/wk for 6 weeks   PT Duration  6 weeks   then 2x/wk for 6 weeks   PT Treatment/Interventions  ADLs/Self Care Home Management;DME Instruction;Gait training;Stair training;Functional mobility training;Therapeutic activities;Therapeutic exercise;Balance training;Neuromuscular re-education;Patient/family education;Prosthetic Training;Passive range of motion;Energy conservation;Vestibular    PT Next Visit Plan  check residual limb after fall, work towards [employer] and Agree with Plan of Care  Patient       Patient will benefit from skilled therapeutic intervention in order to improve the following deficits and impairments:  Abnormal gait, Cardiopulmonary status limiting activity, Decreased activity tolerance, Decreased balance, Decreased endurance,  Decreased knowledge of use of DME, Decreased mobility, Decreased strength, Impaired perceived functional ability, Impaired flexibility, Impaired sensation, Postural dysfunction  Visit Diagnosis: Unsteadiness on feet  Other abnormalities of gait and mobility  Muscle weakness (generalized)  Abnormal posture     Problem List Patient Active Problem List   Diagnosis Date Noted  . Diabetic neuropathy ([place]) 10/22/2015  . Chronic systolic heart failure ([place]) 08/24/2011  . HTN (hypertension), malignant 08/14/2011  . Diabetes mellitus type 2, noninsulin dependent ([place]) 08/14/2011    Akiva Josey  PT, DPT 04/13/2018, 5:16 PM  [facility] [address] [place], [place], [address] Phone: [phone]   Fax:  [phone]  Name: Dustin Lozano MRN: [id number] Date of Birth: [birth year]

## 2018-04-17 ENCOUNTER — Ambulatory Visit: Payer: Medicaid Other | Admitting: Physical Therapy

## 2018-04-17 ENCOUNTER — Encounter: Payer: Self-pay | Admitting: Physical Therapy

## 2018-04-17 DIAGNOSIS — R2689 Other abnormalities of gait and mobility: Secondary | ICD-10-CM

## 2018-04-17 NOTE — Therapy (Addendum)
[facility] [address] [place], [place], [address] Phone: [phone]   Fax:  [phone]  Physical Therapy Treatment  Patient Details  Name: Dustin Lozano MRN: [id number] Date of Birth: 1961-01-08 Referring Provider (PT): Antonieta Pert, FNP   Encounter Date: 04/17/2018    Past Medical History:  Diagnosis Date  . CHF (congestive heart failure) (HCC)    Systolic - LVEF 10-17  (51/0258)   . Coronary artery disease    Nonobstructive disease per cath 08/2011  . Diabetes mellitus   . HTN (hypertension)     Past Surgical History:  Procedure Laterality Date  . APPENDECTOMY    . LEFT AND RIGHT HEART CATHETERIZATION WITH CORONARY ANGIOGRAM N/A 08/17/2011   Procedure: LEFT AND RIGHT HEART CATHETERIZATION WITH CORONARY ANGIOGRAM;  Surgeon: Sherren Mocha, MD;  Location: [facility] CATH LAB;  Service: Cardiovascular;  Laterality: N/A;    There were no vitals filed for this visit.  Subjective Assessment - 04/17/18 0814    Subjective  He has increased swelling in the leg after his fall and was unable to get the liner on 3/6. Has been wearing both legs 4 hrs/day since. Has been icing leg twice a day. Pt reports pain in L residual limb with WB.      Currently in Pain?  Yes    Pain Location  Knee   left residual limb pain w/ WB   Pain Orientation  Left    Pain Onset  In the past 7 days   s/p fall 4 days ago       Pt presents to PT today with slight discoloration distal to the L patella and swelling in redisual limb. Pt was seen with no charge today due to concerns for potential L tibial plateau fracture given proximal discoloration and persistent swelling in L residual limb s/p fall four days ago. Pt referred to PCP for imaging and instructed to follow MD's WB precautions for L limb. Educated pt to continue wearing R prosthesis to decrease falls risk. Appointments held for this week pending imaging results. Will f/u with pt after he  visits his PCP.    PT Short Term Goals - 03/30/18 0913      PT SHORT TERM GOAL #1   Title  Pt will initiate HEP in order to indicate improved strength and decreased fall risk.  (Target Date: Following 3rd visit)    Baseline  MET 03/06/2018    Time  4    Period  Weeks    Status  Achieved      PT SHORT TERM GOAL #2   Title  Pt will ambulate over indoor surfaces x 200' w/ SPC and B prostheses at mod I level in order to indicate safe home negotiation.     Baseline  MET 03/06/2018    Time  4    Period  Weeks    Status  Achieved      PT SHORT TERM GOAL #3   Title  Pt will improve gait speed to 2.62 ft/sec with single SPC in order to indicate more efficient gait and decreased fall risk.     Baseline  MET 03/06/2018 Gait velocity with cane & prostheses 2.98 ft/sec    Time  4    Period  Weeks    Status  Achieved      PT SHORT TERM GOAL #4   Title  Patient ambulates 400' outdoors on paved surfaces with cane & prostheses with supervision. (All updated STGs  Target Date: 03/31/2018)    Time  3    Period  Weeks    Status  Achieved      PT SHORT TERM GOAL #5   Title  Patient negotiates ramps & curbs with cane & prostheses with supervision.     Time  3    Period  Weeks    Status  Achieved        PT Long Term Goals - 03/06/18 0907      PT LONG TERM GOAL #1   Title  Patient verbalizes & demonstrates understanding of proper prosthetic care to enable safe use of prostheses. (All LTGs Target Date: 15 visits after evaluation)    Baseline  03/06/2018  Patient requires cues for skin management, adjusting ply socks with limb volume changes and problem solving new issues.     Time  6    Period  Weeks    Status  New    Target Date  04/21/18      PT LONG TERM GOAL #2   Title  Patient tolerates wear of bilateral prostheses >90% of awake hours without skin issues or limb pain to enable function throughout his day.     Baseline  03/06/2018  Patient is wearing prostheses >90% of awake hours but has  skin changes on residual limb with potential for breakdown.     Time  6    Period  Weeks    Status  New    Target Date  04/21/18      PT LONG TERM GOAL #3   Title  Berg Balance >45/56 to indicate lower fall risk.     Baseline  03/06/2018: Merrilee Jansky 38/56    Time  6    Period  Weeks    Status  New    Target Date  04/21/18      PT LONG TERM GOAL #4   Title  Dynamic Gait Index >/= 18/24 to indicate lower fall risk    Baseline  03/06/2018  DGI with cane 13/24    Time  6    Period  Weeks    Status  New    Target Date  04/21/18      PT LONG TERM GOAL #5   Title  Patient ambulates 500' outdoors including grass with cane or less & prostheses modified independent for community mobility.     Baseline  03/06/2018 Patient ambulates 300' with cane & prostheses modified indepedent but only on indoor paved surfaces.     Time  6    Period  Weeks    Status  New    Target Date  04/21/18      PT LONG TERM GOAL #6   Title  Patient negotiates ramps, curbs & stairs with single rail with cane & prostheses modified independent for community access.     Baseline  03/06/2018  Patient requires minimal assist on ramps & moderate assist on curbs with cane & prostheses.     Time  6    Period  Weeks    Status  New    Target Date  04/21/18      PT LONG TERM GOAL #7   Title  Patient ambulates around furniture with prostheses only carrying items in both hands modified independent.     Baseline  Patient ambulates without assistive device with prostheses with minimal guard on straight path only & unable to carry items yet.     Time  6    Period  Weeks  Status  New    Target Date  04/21/18         Patient will benefit from skilled therapeutic intervention in order to improve the following deficits and impairments:     Visit Diagnosis: Other abnormalities of gait and mobility     Problem List Patient Active Problem List   Diagnosis Date Noted  . Diabetic neuropathy ([place]) 10/22/2015  . Chronic  systolic heart failure ([place]) 08/24/2011  . HTN (hypertension), malignant 08/14/2011  . Diabetes mellitus type 2, noninsulin dependent ([place]) 08/14/2011   Joaquin Courts, SPT 04/17/2018, 8:20 AM  [facility] [address] [place], [place], [address] Phone: [phone]   Fax:  [phone]  Name: Dustin Lozano MRN: [id number] Date of Birth: [birth year]   Patient is on target to meet Long Term Goals with 2 additional visits remaining on Medicaid authorization. Patient fell last week injuring his left residual limb.  Patient sent PT an email stating no fractures were found with X-rays. He will continue to limit weight bearing as PT recommended based on pain. PT is holding PT visits to allow left limb to heal so he can tolerate weight bearing with prosthesis.   Jamey Reas, PT, DPT PT Specializing in [place] 04/17/18 11:30 AM Phone:  [phone]  Fax:  [phone] [place] [facility] [address] [place], [place] [address]

## 2018-04-20 ENCOUNTER — Ambulatory Visit: Payer: Medicaid Other | Admitting: Physical Therapy

## 2018-04-25 ENCOUNTER — Encounter: Payer: Self-pay | Admitting: Internal Medicine

## 2018-04-26 ENCOUNTER — Encounter: Payer: Self-pay | Admitting: Physical Therapy

## 2018-04-26 ENCOUNTER — Ambulatory Visit (INDEPENDENT_AMBULATORY_CARE_PROVIDER_SITE_OTHER): Payer: Medicaid Other | Admitting: Family

## 2018-04-26 ENCOUNTER — Encounter (INDEPENDENT_AMBULATORY_CARE_PROVIDER_SITE_OTHER): Payer: Self-pay | Admitting: Family

## 2018-04-26 ENCOUNTER — Ambulatory Visit: Payer: Medicaid Other | Admitting: Physical Therapy

## 2018-04-26 ENCOUNTER — Telehealth: Payer: Self-pay | Admitting: Physical Therapy

## 2018-04-26 ENCOUNTER — Other Ambulatory Visit: Payer: Self-pay

## 2018-04-26 ENCOUNTER — Telehealth (INDEPENDENT_AMBULATORY_CARE_PROVIDER_SITE_OTHER): Payer: Self-pay

## 2018-04-26 VITALS — Ht 71.0 in | Wt 202.0 lb

## 2018-04-26 DIAGNOSIS — Z89512 Acquired absence of left leg below knee: Secondary | ICD-10-CM

## 2018-04-26 DIAGNOSIS — Z89511 Acquired absence of right leg below knee: Secondary | ICD-10-CM | POA: Diagnosis not present

## 2018-04-26 DIAGNOSIS — M79605 Pain in left leg: Secondary | ICD-10-CM

## 2018-04-26 DIAGNOSIS — R2681 Unsteadiness on feet: Secondary | ICD-10-CM | POA: Diagnosis not present

## 2018-04-26 DIAGNOSIS — R293 Abnormal posture: Secondary | ICD-10-CM

## 2018-04-26 DIAGNOSIS — M6281 Muscle weakness (generalized): Secondary | ICD-10-CM

## 2018-04-26 DIAGNOSIS — R2689 Other abnormalities of gait and mobility: Secondary | ICD-10-CM

## 2018-04-26 NOTE — Telephone Encounter (Signed)
Patient is scheduled today at 1:00pm

## 2018-04-26 NOTE — Telephone Encounter (Signed)
[facility] called [phone] and states patient fell 2 weeks ago in Physical therapy and prosthetics fell off -Bil BKA- Saw PCP and X-rays were done. I called patient to schedule appt.he is scheduled this PM.   Patients CB # [phone]

## 2018-04-26 NOTE — Therapy (Signed)
[facility] [address] [place], [place], [address] Phone: [phone]   Fax:  [phone]  Physical Therapy Treatment  Patient Details  Name: Dustin Lozano MRN: [id number] Date of Birth: 07-Dec-1960 Referring Provider (PT): Antonieta Pert, FNP   Encounter Date: 04/26/2018  PT End of Session - 04/26/18 0858    Visit Number  15    Number of Visits  16    Authorization Type  Medicaid    Authorization Time Period  3 visits 02/20/2018 - 03/12/2018,  12 visits 03/13/2018 - 04/23/2018, 2 visits 04/26/2018 - 05/09/2018    Authorization - Visit Number  1    Authorization - Number of Visits  2    PT Start Time  0800    PT Stop Time  0840    PT Time Calculation (min)  40 min    Equipment Utilized During Treatment  Gait belt    Activity Tolerance  Patient tolerated treatment well    Behavior During Therapy  [facility] for tasks assessed/performed       Past Medical History:  Diagnosis Date  . CHF (congestive heart failure) (HCC)    Systolic - LVEF 16-60  (60/0459)   . Coronary artery disease    Nonobstructive disease per cath 08/2011  . Diabetes mellitus   . HTN (hypertension)     Past Surgical History:  Procedure Laterality Date  . APPENDECTOMY    . LEFT AND RIGHT HEART CATHETERIZATION WITH CORONARY ANGIOGRAM N/A 08/17/2011   Procedure: LEFT AND RIGHT HEART CATHETERIZATION WITH CORONARY ANGIOGRAM;  Surgeon: Sherren Mocha, MD;  Location: [facility] CATH LAB;  Service: Cardiovascular;  Laterality: N/A;    There were no vitals filed for this visit.  Subjective Assessment - 04/26/18 0759    Subjective  He has been wearing right prosthesis daily except Saturday. He has wearing left prosthesis 6-8 hrs/day. He has to use RW as cane is too painful.     Currently in Pain?  Yes    Pain Score  6    6/10 walking with RW, 0/10 sitting, 10/10 shooting   Pain Location  Leg    Pain Orientation  Left    Pain Descriptors / Indicators  Shooting    Pain  Type  Acute pain    Pain Onset  In the past 7 days   s/p fall 4 days ago   Pain Frequency  Constant    Aggravating Factors   standing / walking with prosthesis, more weight on prosthesis     Pain Relieving Factors  sitting & no weight on left leg, use of RW limits pain, use of cane pain is 10/10    Effect of Pain on Daily Activities  limits walking      He had X-ray of left knee & limb on 04/17/2018 which showed no acute fracture, mild knee OA.   PT recommended wearing right prosthesis all awake hours to decrease fall risk and assist with transfers.  Left prosthesis is not causing pain without weight bearing. PT recommended wearing 4-6 hrs on, 2hrs off for 2-3 times per day. Left residual limb has no edema or color changes and mild tenderness at distal-lateral area.  He has no pain with sitting or non-weight bearing situations. He is limiting gait as PT recommended. Gait with RW using UEs to limit weight bearing causes left leg pain 6/10 radiating from limb to hip burning, sharp shooting pain. He reports that he has tried cane in home and pain  is 10/10. PT discussed issues with patient with recommendation for referral to orthopedist. With pt agreement, PT called Meridee Score, MD with referral. His office called PT back to inform that patient has appointment this afternoon.                           PT Short Term Goals - 03/30/18 0913      PT SHORT TERM GOAL #1   Title  Pt will initiate HEP in order to indicate improved strength and decreased fall risk.  (Target Date: Following 3rd visit)    Baseline  MET 03/06/2018    Time  4    Period  Weeks    Status  Achieved      PT SHORT TERM GOAL #2   Title  Pt will ambulate over indoor surfaces x 200' w/ SPC and B prostheses at mod I level in order to indicate safe home negotiation.     Baseline  MET 03/06/2018    Time  4    Period  Weeks    Status  Achieved      PT SHORT TERM GOAL #3   Title  Pt will improve gait speed to  2.62 ft/sec with single SPC in order to indicate more efficient gait and decreased fall risk.     Baseline  MET 03/06/2018 Gait velocity with cane & prostheses 2.98 ft/sec    Time  4    Period  Weeks    Status  Achieved      PT SHORT TERM GOAL #4   Title  Patient ambulates 400' outdoors on paved surfaces with cane & prostheses with supervision. (All updated STGs Target Date: 03/31/2018)    Time  3    Period  Weeks    Status  Achieved      PT SHORT TERM GOAL #5   Title  Patient negotiates ramps & curbs with cane & prostheses with supervision.     Time  3    Period  Weeks    Status  Achieved        PT Long Term Goals - 03/06/18 0907      PT LONG TERM GOAL #1   Title  Patient verbalizes & demonstrates understanding of proper prosthetic care to enable safe use of prostheses. (All LTGs Target Date: 15 visits after evaluation)    Baseline  03/06/2018  Patient requires cues for skin management, adjusting ply socks with limb volume changes and problem solving new issues.     Time  6    Period  Weeks    Status  New    Target Date  04/21/18      PT LONG TERM GOAL #2   Title  Patient tolerates wear of bilateral prostheses >90% of awake hours without skin issues or limb pain to enable function throughout his day.     Baseline  03/06/2018  Patient is wearing prostheses >90% of awake hours but has skin changes on residual limb with potential for breakdown.     Time  6    Period  Weeks    Status  New    Target Date  04/21/18      PT LONG TERM GOAL #3   Title  Berg Balance >45/56 to indicate lower fall risk.     Baseline  03/06/2018: Merrilee Jansky 38/56    Time  6    Period  Weeks    Status  New  Target Date  04/21/18      PT LONG TERM GOAL #4   Title  Dynamic Gait Index >/= 18/24 to indicate lower fall risk    Baseline  03/06/2018  DGI with cane 13/24    Time  6    Period  Weeks    Status  New    Target Date  04/21/18      PT LONG TERM GOAL #5   Title  Patient ambulates 500' outdoors  including grass with cane or less & prostheses modified independent for community mobility.     Baseline  03/06/2018 Patient ambulates 300' with cane & prostheses modified indepedent but only on indoor paved surfaces.     Time  6    Period  Weeks    Status  New    Target Date  04/21/18      PT LONG TERM GOAL #6   Title  Patient negotiates ramps, curbs & stairs with single rail with cane & prostheses modified independent for community access.     Baseline  03/06/2018  Patient requires minimal assist on ramps & moderate assist on curbs with cane & prostheses.     Time  6    Period  Weeks    Status  New    Target Date  04/21/18      PT LONG TERM GOAL #7   Title  Patient ambulates around furniture with prostheses only carrying items in both hands modified independent.     Baseline  Patient ambulates without assistive device with prostheses with minimal guard on straight path only & unable to carry items yet.     Time  6    Period  Weeks    Status  New    Target Date  04/21/18            Plan - 04/26/18 0902    Clinical Impression Statement  Patient's left limb is still painful with any weight bearing. He needs to have an orthopedist assess limb /leg more thoroughly. PT made referral Dr. Sharol Given. who set up appointment for this afternoon.     Rehab Potential  Good    Clinical Impairments Affecting Rehab Potential  Pt very motivated, limited by MCD visit limitations    PT Frequency  2x / week   then 2x/wk for 6 weeks   PT Duration  6 weeks   then 2x/wk for 6 weeks   PT Treatment/Interventions  ADLs/Self Care Home Management;DME Instruction;Gait training;Stair training;Functional mobility training;Therapeutic activities;Therapeutic exercise;Balance training;Neuromuscular re-education;Patient/family education;Prosthetic Training;Passive range of motion;Energy conservation;Vestibular    PT Next Visit Plan  check residual limb after fall, work towards [employer] and Agree with Plan of  Care  Patient       Patient will benefit from skilled therapeutic intervention in order to improve the following deficits and impairments:  Abnormal gait, Cardiopulmonary status limiting activity, Decreased activity tolerance, Decreased balance, Decreased endurance, Decreased knowledge of use of DME, Decreased mobility, Decreased strength, Impaired perceived functional ability, Impaired flexibility, Impaired sensation, Postural dysfunction  Visit Diagnosis: Other abnormalities of gait and mobility  Unsteadiness on feet  Muscle weakness (generalized)  Abnormal posture     Problem List Patient Active Problem List   Diagnosis Date Noted  . Diabetic neuropathy ([place]) 10/22/2015  . Chronic systolic heart failure ([place]) 08/24/2011  . HTN (hypertension), malignant 08/14/2011  . Diabetes mellitus type 2, noninsulin dependent ([place]) 08/14/2011    Tylique Aull PT DPT 04/26/2018, 9:07 AM  [facility] [address] [place], [place], [address] Phone: [phone]   Fax:  [phone]  Name: Dustin Lozano MRN: [id number] Date of Birth: 08-12-1960
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
## 2018-04-26 NOTE — Progress Notes (Signed)
Office Visit Note   Patient: Dustin Lozano           Date of Birth: February 06, 1961           MRN: [id number] Visit Date: 04/26/2018              Requested by: Deborah Chalk, FNP [address] [place], [place] [address] PCP: Deborah Chalk, FNP  Chief Complaint  Patient presents with  . Left Leg - Pain    NP; left leg stump in pain w/prosthetic due to previous fall 2 wks ago      HPI: The patient is a 58 year old gentleman seen today for evaluation of pain to his left residual limb.  He is status post bilateral below the knee amputations May of last year.  He fell 2 weeks ago in therapy his prosthetic was not secured fell off he fell landing what appears to be his distal fibula.  Had pain in his stump knee and hip.  Initial radiographs of the tib-fib were negative.  Having no pain at rest.  Pain standing is about a 6 out of a 10 burning pain that shoots from his distal limb up to his knee laterally.  This is very difficult for him to ambulate in his prosthesis now due to pain.  Assessment & Plan: Visit Diagnoses: No diagnosis found.  Plan: Encouraged him that this will take time.  Reassurance of no fracture.  Will hold off on therapy until pain has improved.  Shirlean Mylar has delayed his therapy visit till March 30.  We will evaluate at that time.  Feel that he has soft tissue injury.  Follow-Up Instructions: Return if symptoms worsen or fail to improve.   Ortho Exam  Patient is alert, oriented, no adenopathy, well-dressed, normal affect, normal respiratory effort. On examination the left residual limb is well consolidated and well-healed there is no erythema no edema does have some tenderness to the distal fibula.  Cannot reproduce shooting pain no radicular symptoms.  Negative straight leg raise.  Imaging: No results found. No images are attached to the encounter.  Labs: Lab Results  Component Value Date   HGBA1C 5.9 11/17/2016   HGBA1C 8.2 08/18/2016   HGBA1C 7.8  (H) 05/17/2016     Lab Results  Component Value Date   ALBUMIN 2.5 (L) 05/11/2017   ALBUMIN 4.1 05/17/2016   ALBUMIN 4.3 10/09/2015    Body mass index is 28.17 kg/m.  Orders:  No orders of the defined types were placed in this encounter.  No orders of the defined types were placed in this encounter.    Procedures: No procedures performed  Clinical Data: No additional findings.  ROS:  All other systems negative, except as noted in the HPI. Review of Systems  Objective: Vital Signs: Ht 5\' 11"  (1.803 m)   Wt 202 lb (91.6 kg)   BMI 28.17 kg/m   Specialty Comments:  No specialty comments available.  PMFS History: Patient Active Problem List   Diagnosis Date Noted  . Diabetic neuropathy ([place]) 10/22/2015  . Chronic systolic heart failure ([place]) 08/24/2011  . HTN (hypertension), malignant 08/14/2011  . Diabetes mellitus type 2, noninsulin dependent ([place]) 08/14/2011   Past Medical History:  Diagnosis Date  . CHF (congestive heart failure) (HCC)    Systolic - LVEF 79-39  (04/90)   . Coronary artery disease    Nonobstructive disease per cath 08/2011  . Diabetes mellitus   . HTN (hypertension)  Family History  Problem Relation Age of Onset  . Diabetes Father   . Heart failure Father   . Cancer Mother        lymphoma  . Diabetes Mother   . Colon cancer Neg Hx   . Esophageal cancer Neg Hx   . Rectal cancer Neg Hx   . Stomach cancer Neg Hx     Past Surgical History:  Procedure Laterality Date  . APPENDECTOMY    . LEFT AND RIGHT HEART CATHETERIZATION WITH CORONARY ANGIOGRAM N/A 08/17/2011   Procedure: LEFT AND RIGHT HEART CATHETERIZATION WITH CORONARY ANGIOGRAM;  Surgeon: Sherren Mocha, MD;  Location[facility] CATH LAB;  Service: Cardiovascular;  Laterality: N/A;   Social History   Occupational History  . Occupation: salesman  Tobacco Use  . Smoking status: Former Smoker    Packs/day: 0.70    Years: 15.00    Pack years: 10.50    Last attempt to quit:  02/08/2006    Years since quitting: 12.2  . Smokeless tobacco: Never Used  Substance and Sexual Activity  . Alcohol use: Yes    Comment: rare  . Drug use: No  . Sexual activity: Not on file

## 2018-05-01 ENCOUNTER — Ambulatory Visit: Payer: Medicaid Other | Admitting: Physical Therapy

## 2018-05-08 ENCOUNTER — Encounter: Payer: Self-pay | Admitting: Physical Therapy

## 2018-05-17 ENCOUNTER — Telehealth: Payer: Self-pay | Admitting: Occupational Therapy

## 2018-05-17 NOTE — Telephone Encounter (Signed)
Left message regarding extended reduction of Outpatient [facility] Services due to concerns for community transmission of COVID-19.  Notified pt we can be reached by telephone during limited business hours and that e-visits may be an option for continuing care.  Pt to return call if interested in e-visit or if questions/concerns.      Vianne Bulls, OTR/L [facility] [address] [place], [place]  [address] [phone] phone [phone] 05/17/18 11:55 AM

## 2018-06-21 ENCOUNTER — Other Ambulatory Visit: Payer: Self-pay

## 2018-06-21 ENCOUNTER — Ambulatory Visit (INDEPENDENT_AMBULATORY_CARE_PROVIDER_SITE_OTHER): Payer: Medicaid Other | Admitting: Orthopedic Surgery

## 2018-06-21 ENCOUNTER — Encounter: Payer: Self-pay | Admitting: Family

## 2018-06-21 VITALS — Ht 71.0 in | Wt 202.0 lb

## 2018-06-21 DIAGNOSIS — Z89512 Acquired absence of left leg below knee: Secondary | ICD-10-CM | POA: Diagnosis not present

## 2018-06-21 DIAGNOSIS — Z89511 Acquired absence of right leg below knee: Secondary | ICD-10-CM
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
## 2018-06-30 ENCOUNTER — Ambulatory Visit (AMBULATORY_SURGERY_CENTER): Payer: Self-pay

## 2018-06-30 ENCOUNTER — Other Ambulatory Visit: Payer: Self-pay

## 2018-06-30 VITALS — Ht 72.0 in | Wt 200.0 lb

## 2018-06-30 DIAGNOSIS — Z86010 Personal history of colonic polyps: Secondary | ICD-10-CM

## 2018-06-30 MED ORDER — NA SULFATE-K SULFATE-MG SULF 17.5-3.13-1.6 GM/177ML PO SOLN: 1.0000 | Freq: Once | ORAL | 0 refills | Status: AC

## 2018-06-30 NOTE — Progress Notes (Signed)
Denies allergies to eggs or soy products. Denies complication of anesthesia or sedation. Denies use of weight loss medication. Denies use of O2.   Emmi instructions given for colonoscopy.  Pre-Visit was conducted by phone due to Covid 19. Instructions were reviewed with patient and mailed to confirmed home address. Insurance was verified. Patient was encouraged to call if he had any questions or concerns regarding instructions.

## 2018-07-07 ENCOUNTER — Telehealth: Payer: Self-pay | Admitting: *Deleted

## 2018-07-07 NOTE — Telephone Encounter (Signed)
Covid-19 screening questions  Have you traveled in the last 14 days? no If yes where?  Do you now or have you had a fever in the last 14 days? no  Do you have any respiratory symptoms of shortness of breath or cough now or in the last 14 days? no  Do you have any family members or close contacts with diagnosed or suspected Covid-19 in the past 14 days? no  Have you been tested for Covid-19 and found to be positive? No  Pt is aware that care partner will wait in the car during parking lot- I did remind patient that their care partner needs to stay in the parking lot the entire time. Pt will wear mask into building        

## 2018-07-10 ENCOUNTER — Ambulatory Visit: Payer: Medicaid Other | Attending: Nurse Practitioner | Admitting: Physical Therapy

## 2018-07-10 ENCOUNTER — Encounter: Payer: Self-pay | Admitting: Internal Medicine

## 2018-07-10 ENCOUNTER — Ambulatory Visit (AMBULATORY_SURGERY_CENTER): Payer: Medicaid Other | Admitting: Internal Medicine

## 2018-07-10 ENCOUNTER — Other Ambulatory Visit: Payer: Self-pay

## 2018-07-10 ENCOUNTER — Encounter: Payer: Self-pay | Admitting: Physical Therapy

## 2018-07-10 VITALS — BP 147/87 | HR 69 | Temp 97.7°F | Resp 16 | Ht 71.0 in | Wt 202.0 lb

## 2018-07-10 DIAGNOSIS — M6281 Muscle weakness (generalized): Secondary | ICD-10-CM

## 2018-07-10 DIAGNOSIS — R293 Abnormal posture: Secondary | ICD-10-CM | POA: Insufficient documentation

## 2018-07-10 DIAGNOSIS — Z86010 Personal history of colonic polyps: Secondary | ICD-10-CM

## 2018-07-10 DIAGNOSIS — R2681 Unsteadiness on feet: Secondary | ICD-10-CM

## 2018-07-10 DIAGNOSIS — R2689 Other abnormalities of gait and mobility: Secondary | ICD-10-CM

## 2018-07-10 MED ORDER — SODIUM CHLORIDE 0.9 % IV SOLN: 500.0000 mL | Freq: Once | INTRAVENOUS | Status: DC

## 2018-07-10 NOTE — Progress Notes (Signed)
Report to PACU, RN, vss, BBS= Clear.  

## 2018-07-10 NOTE — Patient Instructions (Signed)
Thank you for allowing [place] to care for you today!  Resume previous diet and medications today.  Return to your normal activities tomorrow  Recommend next screening colonoscopy in 10 years.     YOU HAD AN ENDOSCOPIC PROCEDURE TODAY AT [facility]:   Refer to the procedure report that was given to you for any specific questions about what was found during the examination.  If the procedure report does not answer your questions, please call your gastroenterologist to clarify.  If you requested that your care partner not be given the details of your procedure findings, then the procedure report has been included in a sealed envelope for you to review at your convenience later.  YOU SHOULD EXPECT: Some feelings of bloating in the abdomen. Passage of more gas than usual.  Walking can help get rid of the air that was put into your GI tract during the procedure and reduce the bloating. If you had a lower endoscopy (such as a colonoscopy or flexible sigmoidoscopy) you may notice spotting of blood in your stool or on the toilet paper. If you underwent a bowel prep for your procedure, you may not have a normal bowel movement for a few days.  Please Note:  You might notice some irritation and congestion in your nose or some drainage.  This is from the oxygen used during your procedure.  There is no need for concern and it should clear up in a day or so.  SYMPTOMS TO REPORT IMMEDIATELY:   Following lower endoscopy (colonoscopy or flexible sigmoidoscopy):  Excessive amounts of blood in the stool  Significant tenderness or worsening of abdominal pains  Swelling of the abdomen that is new, acute  Fever of 100F or higher    For urgent or emergent issues, a gastroenterologist can be reached at any hour by calling [phone].   DIET:  We do recommend a small meal at first, but then you may proceed to your regular diet.  Drink plenty of fluids but you should avoid alcoholic beverages  for 24 hours.  ACTIVITY:  You should plan to take it easy for the rest of today and you should NOT DRIVE or use heavy machinery until tomorrow (because of the sedation medicines used during the test).    FOLLOW UP: Our staff will call the number listed on your records 48-72 hours following your procedure to check on you and address any questions or concerns that you may have regarding the information given to you following your procedure. If we do not reach you, we will leave a message.  We will attempt to reach you two times.  During this call, we will ask if you have developed any symptoms of COVID 19. If you develop any symptoms (ie: fever, flu-like symptoms, shortness of breath, cough etc.) before then, please call [phone].  If you test positive for Covid 19 in the 2 weeks post procedure, please call and report this information to [place].    If any biopsies were taken you will be contacted by phone or by letter within the next 1-3 weeks.  Please call us at [phone] if you have not heard about the biopsies in 3 weeks.    SIGNATURES/CONFIDENTIALITY: You and/or your care partner have signed paperwork which will be entered into your electronic medical record.  These signatures attest to the fact that that the information above on your After Visit Summary has been reviewed and is understood.  Full responsibility of the confidentiality  of this discharge information lies with you and/or your care-partner.

## 2018-07-10 NOTE — Op Note (Signed)
[facility] Patient Name: Dustin Lozano Procedure Date: 07/10/2018 1:03 PM MRN: [id number] Endoscopist: Jerene Bears , MD Age: 57 Referring MD:  Date of Birth: 04-19-1960 Gender: Male Account #: [id number] Procedure:                Colonoscopy Indications:              High risk colon cancer surveillance: Personal                            history of non-advanced adenoma, Last colonoscopy 5                            years ago Medicines:                Monitored Anesthesia Care Procedure:                Pre-Anesthesia Assessment:                           - Prior to the procedure, a History and Physical                            was performed, and patient medications and                            allergies were reviewed. The patient's tolerance of                            previous anesthesia was also reviewed. The risks                            and benefits of the procedure and the sedation                            options and risks were discussed with the patient.                            All questions were answered, and informed consent                            was obtained. Prior Anticoagulants: The patient has                            taken no previous anticoagulant or antiplatelet                            agents except for aspirin. ASA Grade Assessment:                            III - A patient with severe systemic disease. After                            reviewing the risks and benefits, the patient was  deemed in satisfactory condition to undergo the                            procedure.                           After obtaining informed consent, the colonoscope                            was passed under direct vision. Throughout the                            procedure, the patient's blood pressure, pulse, and                            oxygen saturations were monitored continuously. The   Colonoscope was introduced through the anus and                            advanced to the terminal ileum. The colonoscopy was                            performed without difficulty. The patient tolerated                            the procedure well. The quality of the bowel                            preparation was good. The terminal ileum, ileocecal                            valve, appendiceal orifice, and rectum were                            photographed. Scope In: 1:34:51 PM Scope Out: 1:45:35 PM Scope Withdrawal Time: 0 hours 9 minutes 19 seconds  Total Procedure Duration: 0 hours 10 minutes 44 seconds  Findings:                 The digital rectal exam was normal.                           There was evidence of a prior end-to-side                            ileo-colonic anastomosis in the cecum. This was                            patent and was characterized by healthy appearing                            mucosa.                           The neo-terminal ileum appeared normal.  The entire examined colon appeared normal on direct                            and retroflexion views. Complications:            No immediate complications. Impression:               - Patent end-to-side ileo-colonic anastomosis,                            characterized by healthy appearing mucosa.                           - The examined portion of the ileum was normal.                           - The entire examined colon is normal on direct and                            retroflexion views.                           - No specimens collected. Recommendation:           - Patient has a contact number available for                            emergencies. The signs and symptoms of potential                            delayed complications were discussed with the                            patient. Return to normal activities tomorrow.                            Written  discharge instructions were provided to the                            patient.                           - Resume previous diet.                           - Continue present medications.                           - Repeat colonoscopy in 10 years for surveillance. Jerene Bears, MD 07/10/2018 1:50:22 PM This report has been signed electronically.

## 2018-07-10 NOTE — Therapy (Addendum)
[facility] [address] [place], [place], [address] Phone: [phone]   Fax:  [phone]  Physical Therapy Treatment  Patient Details  Name: Dustin Lozano MRN: [id number] Date of Birth: 27-Oct-1960 Referring Provider (PT): Antonieta Pert, FNP   Encounter Date: 07/10/2018   PHYSICAL THERAPY DISCHARGE SUMMARY  Visits from Start of Care: 16  Current functional level related to goals / functional outcomes: See below   Remaining deficits: See below   Education / Equipment: Prosthetic care & ongoing fitness plan  Plan: Patient agrees to discharge.  Patient goals were met. Patient is being discharged due to meeting the stated rehab goals.  ?????       PT End of Session - 07/10/18 0919    Visit Number  16    Number of Visits  16    Authorization Type  Medicaid    Authorization Time Period  3 visits 02/20/2018 - 03/12/2018,  12 visits 03/13/2018 - 04/23/2018, 2 visits 04/26/2018 - 05/09/2018    PT Start Time  0750    PT Stop Time  0850    PT Time Calculation (min)  60 min    Equipment Utilized During Treatment  Gait belt    Activity Tolerance  Patient tolerated treatment well    Behavior During Therapy  WFL for tasks assessed/performed       CLINIC OPERATION CHANGES: [facility] is operating at a low capacity due to COVID-19.  The patient was brought into the clinic for evaluation and/or treatment following universal masking by staff, social distancing, and <10 people in the clinic.  The patient's COVID risk of complications score is 3.  Past Medical History:  Diagnosis Date  . Allergy   . Arthritis   . CHF (congestive heart failure) (HCC)    Systolic - LVEF 81-85  (63/1497)   . Coronary artery disease    Nonobstructive disease per cath 08/2011  . Diabetes mellitus   . GERD (gastroesophageal reflux disease)   . HTN (hypertension)   . Hyperlipidemia   . Neuromuscular disorder ([place])    Nerve damage    Past Surgical History:  Procedure Laterality Date  . APPENDECTOMY    . BELOW KNEE LEG AMPUTATION Bilateral   . LEFT AND RIGHT HEART CATHETERIZATION WITH CORONARY ANGIOGRAM N/A 08/17/2011   Procedure: LEFT AND RIGHT HEART CATHETERIZATION WITH CORONARY ANGIOGRAM;  Surgeon: Sherren Mocha, MD;  Location: [facility] CATH LAB;  Service: Cardiovascular;  Laterality: N/A;    There were no vitals filed for this visit.  Subjective Assessment - 07/10/18 0755    Subjective  He has fallen 3 times since last PT visit in March. No injuries.  Still on Medicaid. He went to work for 2 weeks in March, none in April and May 4 days/wk for 4hrs. 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  Dr. Sharol Given wrote Rx for socket revisions. He saw [employer] and Staci Righter, [facility] feels current sockets for ~2 months.     Patient Stated Goals  To be active as he was prior.     Currently in Pain?  No/denies    Pain Onset  In the past 7 days   s/p fall 4 days ago        [facility] PT Assessment - 07/10/18 0750      Assessment   Medical Diagnosis  Bilateral Transtibial Amputation    Referring Provider (PT)  Antonieta Pert, FNP  Transfers   Transfers  Sit to Stand;Stand to Sit    Sit to Stand  6: Modified independent (Device/Increase time);With upper extremity assist;From chair/3-in-1    Stand to Sit  6: Modified independent (Device/Increase time);With upper extremity assist;To chair/3-in-1      Ambulation/Gait   Ambulation/Gait  Yes    Ambulation/Gait Assistance  6: Modified independent (Device/Increase time)    Ambulation Distance (Feet)  600 Feet    Assistive device  Prostheses;None    Gait Pattern  Step-through pattern;Wide base of support    Ambulation Surface  Indoor;Level;Outdoor;Paved;Gravel;Grass    Gait velocity  3.13 ft/sec comfortable no AD & 3.93 ft/sec fast speed   2.14 ft/sec at eval   Stairs  Yes    Stairs Assistance  6: Modified independent (Device/Increase  time)    Stairs Assistance Details (indicate cue type and reason)  PT recommended descend alternating lead limb and alternate last 2 steps. Pt return demo understanding.     Stair Management Technique  One rail Left;One rail Right;Alternating pattern;Step to pattern;Forwards   ascend alternating & descend step-to initially then alternat   Number of Stairs  4   3 reps   Ramp  6: Modified independent (Device)   prostheses only   Curb  6: Modified independent (Device/increase time);5: Supervision   prostheses only   Curb Details (indicate cue type and reason)  PT demo technique to improve safety with bil. TTA prostheses & no AD. Pt return demo understanding.       Berg Balance Test   Sit to Stand  Able to stand  independently using hands    Standing Unsupported  Able to stand safely 2 minutes    Sitting with Back Unsupported but Feet Supported on Floor or Stool  Able to sit safely and securely 2 minutes    Stand to Sit  Sits safely with minimal use of hands    Transfers  Able to transfer safely, minor use of hands    Standing Unsupported with Eyes Closed  Able to stand 10 seconds safely    Standing Unsupported with Feet Together  Able to place feet together independently and stand 1 minute safely    From Standing, Reach Forward with Outstretched Arm  Can reach confidently >25 cm (10")    From Standing Position, Pick up Object from Floor  Able to pick up shoe safely and easily    From Standing Position, Turn to Look Behind Over each Shoulder  Looks behind from both sides and weight shifts well    Turn 360 Degrees  Able to turn 360 degrees safely but slowly    Standing Unsupported, Alternately Place Feet on Step/Stool  Able to stand independently and complete 8 steps >20 seconds    Standing Unsupported, One Foot in Front  Able to take small step independently and hold 30 seconds    Standing on One Leg  Tries to lift leg/unable to hold 3 seconds but remains standing independently    Total Score   47    Berg comment:  02/13/2018 was 36/56      Dynamic Gait Index   Level Surface  Normal    Change in Gait Speed  Normal    Gait with Horizontal Head Turns  Normal    Gait with Vertical Head Turns  Normal    Gait and Pivot Turn  Normal    Step Over Obstacle  Mild Impairment    Step Around Obstacles  Normal    Steps  Moderate Impairment    Total Score  21    DGI comment:  eval was 11/24      Functional Gait  Assessment   Gait assessed   Yes    Gait Level Surface  Walks 20 ft in less than 7 sec but greater than 5.5 sec, uses assistive device, slower speed, mild gait deviations, or deviates 6-10 in outside of the 12 in walkway width.    Change in Gait Speed  Able to smoothly change walking speed without loss of balance or gait deviation. Deviate no more than 6 in outside of the 12 in walkway width.    Gait with Horizontal Head Turns  Performs head turns smoothly with no change in gait. Deviates no more than 6 in outside 12 in walkway width    Gait with Vertical Head Turns  Performs head turns with no change in gait. Deviates no more than 6 in outside 12 in walkway width.    Gait and Pivot Turn  Pivot turns safely within 3 sec and stops quickly with no loss of balance.    Step Over Obstacle  Is able to step over one shoe box (4.5 in total height) but must slow down and adjust steps to clear box safely. May require verbal cueing.    Gait with Narrow Base of Support  Ambulates less than 4 steps heel to toe or cannot perform without assistance.    Gait with Eyes Closed  Walks 20 ft, uses assistive device, slower speed, mild gait deviations, deviates 6-10 in outside 12 in walkway width. Ambulates 20 ft in less than 9 sec but greater than 7 sec.    Ambulating Backwards  Walks 20 ft, uses assistive device, slower speed, mild gait deviations, deviates 6-10 in outside 12 in walkway width.    Steps  Two feet to a stair, must use rail.    Total Score  20      Prosthetics Assessment - 07/10/18 0750       Prosthetics   Prosthetic Care Independent with  Skin check;Residual limb care;Prosthetic cleaning;Ply sock cleaning;Correct ply sock adjustment;Proper wear schedule/adjustment;Proper weight-bearing schedule/adjustment    Prosthetic Care Comments   discussed skin checks, rash management,     Donning prosthesis   Modified independent (Device/Increase time)    Doffing prosthesis   Modified independent (Device/Increase time)    Current prosthetic wear tolerance (days/week)   daily    Current prosthetic wear tolerance (#hours/day)   most of awake hours    Current prosthetic weight-bearing tolerance (hours/day)   Patient tolerated 30 minutes of standing & gait activities with no c/o pain or discomfort.     Edema  no edema    Residual limb condition   right limb has 49m X 880mcallous opening at distal lateral tibia, left no open areas but light red rash distal limb from sweat issues.  bilateral have cylinderical shape.     K code/activity level with prosthetic use   K3 full community with variable cadence                            PT Short Term Goals - 03/30/18 0913      PT SHORT TERM GOAL #1   Title  Pt will initiate HEP in order to indicate improved strength and decreased fall risk.  (Target Date: Following 3rd visit)    Baseline  MET 03/06/2018    Time  4  Period  Weeks    Status  Achieved      PT SHORT TERM GOAL #2   Title  Pt will ambulate over indoor surfaces x 200' w/ SPC and B prostheses at mod I level in order to indicate safe home negotiation.     Baseline  MET 03/06/2018    Time  4    Period  Weeks    Status  Achieved      PT SHORT TERM GOAL #3   Title  Pt will improve gait speed to 2.62 ft/sec with single SPC in order to indicate more efficient gait and decreased fall risk.     Baseline  MET 03/06/2018 Gait velocity with cane & prostheses 2.98 ft/sec    Time  4    Period  Weeks    Status  Achieved      PT SHORT TERM GOAL #4   Title  Patient  ambulates 400' outdoors on paved surfaces with cane & prostheses with supervision. (All updated STGs Target Date: 03/31/2018)    Time  3    Period  Weeks    Status  Achieved      PT SHORT TERM GOAL #5   Title  Patient negotiates ramps & curbs with cane & prostheses with supervision.     Time  3    Period  Weeks    Status  Achieved        PT Long Term Goals - 07/10/18 1007      PT LONG TERM GOAL #1   Title  Patient verbalizes & demonstrates understanding of proper prosthetic care to enable safe use of prostheses. (All LTGs Target Date: 15 visits after evaluation)    Baseline  MET 07/10/2018    Time  6    Period  Weeks    Status  Achieved      PT LONG TERM GOAL #2   Title  Patient tolerates wear of bilateral prostheses >90% of awake hours without skin issues or limb pain to enable function throughout his day.     Baseline  MET 07/10/2018    Time  6    Period  Weeks    Status  Achieved      PT LONG TERM GOAL #3   Title  Berg Balance >45/56 to indicate lower fall risk.     Baseline  MET 07/10/2018  Berg Balance 47/56    Time  6    Period  Weeks    Status  Achieved      PT LONG TERM GOAL #4   Title  Dynamic Gait Index >/= 18/24 to indicate lower fall risk    Baseline  MET 07/10/2018  Dynamic Gait Index 21/24  and Functional Gait Assessment 20/30     Time  6    Period  Weeks    Status  Achieved      PT LONG TERM GOAL #5   Title  Patient ambulates 500' outdoors including grass with cane or less & prostheses modified independent for community mobility.     Baseline  MET 07/10/2018 with prostheses only     Time  6    Period  Weeks    Status  Achieved      PT LONG TERM GOAL #6   Title  Patient negotiates ramps, curbs & stairs with single rail with cane & prostheses modified independent for community access.     Baseline  MET 07/10/2018 with prostheses only     Time  6    Period  Weeks    Status  Achieved      PT LONG TERM GOAL #7   Title  Patient ambulates around furniture with  prostheses only carrying items in both hands modified independent.     Baseline  MET 07/10/2018    Time  6    Period  Weeks    Status  Achieved            Plan - 07/10/18 1009    Clinical Impression Statement  PT was held with COVID social distancing limitations. PT reassessed today to determine he needs additional PT services. Patient met all LTGs established. Berg Balance improved to 47/56 indicating lower fall risk.  Dynamic Gait Index 21/24 and Functional Gait Assessment 20/30 both indicate lower fall risk. Patient has a superficial wound on right limb but appears from recent fall. He reports 3 recent falls but all were related to tripping over items when not paying attention. He is increasing his awareness of environment to minimize falls. Patient does not appear to need further skilled PT services at this time.     Rehab Potential  Good    Clinical Impairments Affecting Rehab Potential  Pt very motivated, limited by MCD visit limitations    PT Frequency  2x / week   then 2x/wk for 6 weeks   PT Duration  6 weeks   then 2x/wk for 6 weeks   PT Treatment/Interventions  ADLs/Self Care Home Management;DME Instruction;Gait training;Stair training;Functional mobility training;Therapeutic activities;Therapeutic exercise;Balance training;Neuromuscular re-education;Patient/family education;Prosthetic Training;Passive range of motion;Energy conservation;Vestibular    PT Next Visit Plan  discharge PT    Consulted and Agree with Plan of Care  Patient       Patient will benefit from skilled therapeutic intervention in order to improve the following deficits and impairments:  Abnormal gait, Cardiopulmonary status limiting activity, Decreased activity tolerance, Decreased balance, Decreased endurance, Decreased knowledge of use of DME, Decreased mobility, Decreased strength, Impaired perceived functional ability, Impaired flexibility, Impaired sensation, Postural dysfunction  Visit Diagnosis: Other  abnormalities of gait and mobility  Unsteadiness on feet  Muscle weakness (generalized)  Abnormal posture     Problem List Patient Active Problem List   Diagnosis Date Noted  . S/P bilateral below knee amputation ([place]) 04/26/2018  . Diabetic neuropathy ([place]) 10/22/2015  . Chronic systolic heart failure ([place]) 08/24/2011  . HTN (hypertension), malignant 08/14/2011  . Diabetes mellitus type 2, noninsulin dependent ([place]) 08/14/2011    Brodrick Curran  PT, DPT 07/10/2018, 10:14 AM  [facility] [address] [place], [place], [address] Phone: [phone]   Fax:  [phone]  Name: Dustin Lozano MRN: [id number] Date of Birth: March 22, 1960

## 2018-07-12 ENCOUNTER — Telehealth: Payer: Self-pay

## 2018-07-12 NOTE — Telephone Encounter (Signed)
  Follow up Call-  Call back number 07/10/2018  Post procedure Call Back phone  # [phone]  Permission to leave phone message Yes  Some recent data might be hidden     Patient questions:  Do you have a fever, pain , or abdominal swelling? No. Pain Score  0 *  Have you tolerated food without any problems? Yes.    Have you been able to return to your normal activities? Yes.    Do you have any questions about your discharge instructions: Diet   No. Medications  No. Follow up visit  No.  Do you have questions or concerns about your Care? No.  Actions: * If pain score is 4 or above: No action needed, pain <4.   1. Have you developed a fever since your procedure? no  2.   Have you had an respiratory symptoms (SOB or cough) since your procedure? no  3.   Have you tested positive for COVID 19 since your procedure no  4.   Have you had any family members/close contacts diagnosed with the COVID 19 since your procedure?  no   If yes to any of these questions please route to Joylene John, RN and Alphonsa Gin, Therapist, sports.

## 2018-11-23 ENCOUNTER — Other Ambulatory Visit: Payer: Self-pay

## 2018-11-23 ENCOUNTER — Encounter: Payer: Self-pay | Admitting: Orthopedic Surgery

## 2018-11-23 ENCOUNTER — Ambulatory Visit (INDEPENDENT_AMBULATORY_CARE_PROVIDER_SITE_OTHER): Payer: Medicaid Other | Admitting: Orthopedic Surgery

## 2018-11-23 VITALS — Ht 71.0 in | Wt 202.0 lb

## 2018-11-23 DIAGNOSIS — Z89511 Acquired absence of right leg below knee: Secondary | ICD-10-CM | POA: Diagnosis not present

## 2018-11-23 DIAGNOSIS — Z89512 Acquired absence of left leg below knee: Secondary | ICD-10-CM | POA: Diagnosis not present

## 2018-11-23 NOTE — Progress Notes (Signed)
Office Visit Note   Patient: Dustin Lozano           Date of Birth: [birth year]           MRN: [id number] Visit Date: 11/23/2018              Requested by: Deborah Chalk, FNP [id number] [place],  [place] [address] PCP: Deborah Chalk, FNP  Chief Complaint  Patient presents with  . Right Leg - Follow-up    Bil Prosthetic sockets S/p Bil BKA 06/21/2018  . Left Leg - Follow-up      HPI: Patient is a 58 year old gentleman who is status post bilateral transtibial amputations.  Patient is being seen by [employer] for prosthetic sockets.  Patient needs new stump shrinker's as well as a new socket due to loss of residual volume.  Assessment & Plan: Visit Diagnoses:  1. S/P bilateral below knee amputation ([place])     Plan: Patient was given a prescription for [employer] for new socket new liners new shrinkers.  Follow-Up Instructions: Return if symptoms worsen or fail to improve.   Ortho Exam  Patient is alert, oriented, no adenopathy, well-dressed, normal affect, normal respiratory effort. Examination patient has lost the residual volume he does not have rotational stability he has an bearing on the residual limb within the socket there is no ulcers no breakdown of the skin no signs of infection.  Imaging: No results found. No images are attached to the encounter.  Labs: Lab Results  Component Value Date   HGBA1C 5.9 11/17/2016   HGBA1C 8.2 08/18/2016   HGBA1C 7.8 (H) 05/17/2016     Lab Results  Component Value Date   ALBUMIN 2.5 (L) 05/11/2017   ALBUMIN 4.1 05/17/2016   ALBUMIN 4.3 10/09/2015    Lab Results  Component Value Date   MG 1.9 08/15/2011   MG 2.0 08/14/2011   No results found for: VD25OH  No results found for: PREALBUMIN CBC EXTENDED Latest Ref Rng & Units 05/11/2017 05/17/2016 10/09/2015  WBC 4.0 - 10.5 K/uL 22.5(H) 8.0 9.0  RBC 4.22 - 5.81 MIL/uL 4.55 4.52 4.65  HGB 13.0 - 17.0 g/dL 12.6(L) 13.3 13.8  HCT 39.0 - 52.0 % 37.2(L) 38.7(L)  40.5  PLT 150 - 400 K/uL 305 292.0 324.0  NEUTROABS 1.7 - 7.7 K/uL 19.7(H) 4.3 5.5  LYMPHSABS 0.7 - 4.0 K/uL 0.7 2.1 2.2     Body mass index is 28.17 kg/m.  Orders:  No orders of the defined types were placed in this encounter.  No orders of the defined types were placed in this encounter.    Procedures: No procedures performed  Clinical Data: No additional findings.  ROS:  All other systems negative, except as noted in the HPI. Review of Systems  Objective: Vital Signs: Ht 5\' 11"  (1.803 m)   Wt 202 lb (91.6 kg)   BMI 28.17 kg/m   Specialty Comments:  No specialty comments available.  PMFS History: Patient Active Problem List   Diagnosis Date Noted  . S/P bilateral below knee amputation ([place]) 04/26/2018  . Diabetic neuropathy ([place]) 10/22/2015  . Chronic systolic heart failure ([place]) 08/24/2011  . HTN (hypertension), malignant 08/14/2011  . Diabetes mellitus type 2, noninsulin dependent ([place]) 08/14/2011   Past Medical History:  Diagnosis Date  . Allergy   . Arthritis   . CHF (congestive heart failure) (HCC)    Systolic - LVEF 123456  (0000000)   . Coronary artery disease  Nonobstructive disease per cath 08/2011  . Diabetes mellitus   . GERD (gastroesophageal reflux disease)   . HTN (hypertension)   . Hyperlipidemia   . Neuromuscular disorder (HCC)    Nerve damage    Family History  Problem Relation Age of Onset  . Diabetes Father   . Heart failure Father   . Cancer Mother        lymphoma  . Diabetes Mother   . Colon cancer Neg Hx   . Esophageal cancer Neg Hx   . Rectal cancer Neg Hx   . Stomach cancer Neg Hx     Past Surgical History:  Procedure Laterality Date  . APPENDECTOMY    . BELOW KNEE LEG AMPUTATION Bilateral   . COLONOSCOPY    . LEFT AND RIGHT HEART CATHETERIZATION WITH CORONARY ANGIOGRAM N/A 08/17/2011   Procedure: LEFT AND RIGHT HEART CATHETERIZATION WITH CORONARY ANGIOGRAM;  Surgeon: Sherren Mocha, MD;  Location: [facility] CATH LAB;   Service: Cardiovascular;  Laterality: N/A;  . POLYPECTOMY     Social History   Occupational History  . Occupation: salesman  Tobacco Use  . Smoking status: Former Smoker    Packs/day: 0.70    Years: 15.00    Pack years: 10.50    Quit date: 02/08/2006    Years since quitting: 12.8  . Smokeless tobacco: Never Used  Substance and Sexual Activity  . Alcohol use: Yes    Comment: rare  . Drug use: No  . Sexual activity: Not on file

## 2018-12-06 ENCOUNTER — Encounter: Payer: Self-pay | Admitting: Orthopedic Surgery

## 2019-05-31 ENCOUNTER — Encounter: Payer: Self-pay | Admitting: Physical Therapy

## 2019-12-06 ENCOUNTER — Ambulatory Visit: Payer: Medicaid Other | Admitting: Orthopedic Surgery

## 2019-12-13 ENCOUNTER — Ambulatory Visit (INDEPENDENT_AMBULATORY_CARE_PROVIDER_SITE_OTHER): Payer: 59 | Admitting: Physician Assistant

## 2019-12-13 ENCOUNTER — Encounter: Payer: Self-pay | Admitting: Orthopedic Surgery

## 2019-12-13 DIAGNOSIS — Z89512 Acquired absence of left leg below knee: Secondary | ICD-10-CM | POA: Diagnosis not present

## 2019-12-13 DIAGNOSIS — Z89511 Acquired absence of right leg below knee: Secondary | ICD-10-CM

## 2019-12-13 NOTE — Progress Notes (Signed)
Office Visit Note   Patient: Dustin Lozano           Date of Birth: [birth year]           MRN: [id number] Visit Date: 12/13/2019              Requested by: Deborah Chalk, FNP [address] [place],  [place] [address] PCP: Deborah Chalk, FNP  Chief Complaint  Patient presents with  . Right Knee - Blister      HPI: Patient presents today he is status post bilateral below-knee amputations.  He has began to have some abrading on the medial side of his right amputation stump because of an ill fitting socket left socket is also way to big he is having we are 18 ply socks bilaterally.  He feels unstable in his current sockets  Assessment & Plan: Visit Diagnoses: No diagnosis found.  Plan: Prescription was provided for bilateral sockets follow-up as needed I asked that he not wear his right socket as much as possible until the new socket is fashioned  Follow-Up Instructions: No follow-ups on file.   Ortho Exam  Patient is alert, oriented, no adenopathy, well-dressed, normal affect, normal respiratory effort. Bilateral sockets are significantly too large.  He has an abrasion on the inside of his right thigh.  There is no surrounding cellulitis or drainage. Patient is an existing bilateral transtibial  amputee.  Patient's current comorbidities are not expected to impact the ability to function with the prescribed prosthesis. Patient verbally communicates a strong desire to use a prosthesis. Patient currently requires mobility aids to ambulate without a prosthesis.  Expects not to use mobility aids with a new prosthesis.  Patient is a K2 level ambulator that will use a prosthesis to walk around their home and the community over low level environmental barriers.     Imaging: No results found. No images are attached to the encounter.  Labs: Lab Results  Component Value Date   HGBA1C 5.9 11/17/2016   HGBA1C 8.2 08/18/2016   HGBA1C 7.8 (H) 05/17/2016     Lab  Results  Component Value Date   ALBUMIN 2.5 (L) 05/11/2017   ALBUMIN 4.1 05/17/2016   ALBUMIN 4.3 10/09/2015    Lab Results  Component Value Date   MG 1.9 08/15/2011   MG 2.0 08/14/2011   No results found for: VD25OH  No results found for: PREALBUMIN CBC EXTENDED Latest Ref Rng & Units 05/11/2017 05/17/2016 10/09/2015  WBC 4.0 - 10.5 K/uL 22.5(H) 8.0 9.0  RBC 4.22 - 5.81 MIL/uL 4.55 4.52 4.65  HGB 13.0 - 17.0 g/dL 12.6(L) 13.3 13.8  HCT 39 - 52 % 37.2(L) 38.7(L) 40.5  PLT 150 - 400 K/uL 305 292.0 324.0  NEUTROABS 1.7 - 7.7 K/uL 19.7(H) 4.3 5.5  LYMPHSABS 0.7 - 4.0 K/uL 0.7 2.1 2.2     There is no height or weight on file to calculate BMI.  Orders:  No orders of the defined types were placed in this encounter.  No orders of the defined types were placed in this encounter.    Procedures: No procedures performed  Clinical Data: No additional findings.  ROS:  All other systems negative, except as noted in the HPI. Review of Systems  Objective: Vital Signs: There were no vitals taken for this visit.  Specialty Comments:  No specialty comments available.  PMFS History: Patient Active Problem List   Diagnosis Date Noted  . S/P bilateral below knee amputation ([place])  04/26/2018  . Diabetic neuropathy ([place]) 10/22/2015  . Chronic systolic heart failure ([place]) 08/24/2011  . HTN (hypertension), malignant 08/14/2011  . Diabetes mellitus type 2, noninsulin dependent ([place]) 08/14/2011   Past Medical History:  Diagnosis Date  . Allergy   . Arthritis   . CHF (congestive heart failure) (HCC)    Systolic - LVEF 93-79  (03/4095)   . Coronary artery disease    Nonobstructive disease per cath 08/2011  . Diabetes mellitus   . GERD (gastroesophageal reflux disease)   . HTN (hypertension)   . Hyperlipidemia   . Neuromuscular disorder (HCC)    Nerve damage    Family History  Problem Relation Age of Onset  . Diabetes Father   . Heart failure Father   . Cancer Mother         lymphoma  . Diabetes Mother   . Colon cancer Neg Hx   . Esophageal cancer Neg Hx   . Rectal cancer Neg Hx   . Stomach cancer Neg Hx     Past Surgical History:  Procedure Laterality Date  . APPENDECTOMY    . BELOW KNEE LEG AMPUTATION Bilateral   . COLONOSCOPY    . LEFT AND RIGHT HEART CATHETERIZATION WITH CORONARY ANGIOGRAM N/A 08/17/2011   Procedure: LEFT AND RIGHT HEART CATHETERIZATION WITH CORONARY ANGIOGRAM;  Surgeon: Sherren Mocha, MD;  Location: [facility] CATH LAB;  Service: Cardiovascular;  Laterality: N/A;  . POLYPECTOMY     Social History   Occupational History  . Occupation: salesman  Tobacco Use  . Smoking status: Former Smoker    Packs/day: 0.70    Years: 15.00    Pack years: 10.50    Quit date: 02/08/2006    Years since quitting: 13.8  . Smokeless tobacco: Never Used  Substance and Sexual Activity  . Alcohol use: Yes    Comment: rare  . Drug use: No  . Sexual activity: Not on file

## 2019-12-17 ENCOUNTER — Ambulatory Visit: Payer: Medicaid Other | Admitting: Orthopedic Surgery

## 2019-12-20 ENCOUNTER — Ambulatory Visit: Payer: Medicaid Other | Admitting: Orthopedic Surgery

## 2020-04-02 ENCOUNTER — Encounter: Payer: Self-pay | Admitting: Physician Assistant

## 2020-04-02 ENCOUNTER — Ambulatory Visit (INDEPENDENT_AMBULATORY_CARE_PROVIDER_SITE_OTHER): Payer: 59 | Admitting: Physician Assistant

## 2020-04-02 DIAGNOSIS — Z89512 Acquired absence of left leg below knee: Secondary | ICD-10-CM | POA: Diagnosis not present

## 2020-04-02 DIAGNOSIS — Z89511 Acquired absence of right leg below knee: Secondary | ICD-10-CM | POA: Diagnosis not present

## 2020-04-02 NOTE — Progress Notes (Signed)
Office Visit Note   Patient: Dustin Lozano           Date of Birth: 01/01/1961           MRN: [id number] Visit Date: 04/02/2020              Requested by: Deborah Chalk, FNP [address] [place],  [place] [address] PCP: Deborah Chalk, FNP  No chief complaint on file.     HPI: Patient is a pleasant 60 year old gentleman who has a history of bilateral transtibial amputations.  Late last fall he was given a prescription to obtain new prosthetics.  He comes in today because he has developed a callus at the end of his left amputation stump.  Also some redness consistent with pressure on the inside of his right leg.  He thinks this is from the prosthetic fit and has made arrangements to see [place]: Visit Diagnoses: No diagnosis found.  Plan: On the left side I encouraged him to wear a vive shrinker and have measured him for size large today.  Explained to him how to use this.  Wash daily and keep dry and of course take his socket off as much as he can.  On the right side I think this is clearly an area of pressure from the prosthetic.  There is no associated signs of infection but I do think he needs to work with [employer] to improve the fit  Follow-Up Instructions: No follow-ups on file.   Ortho Exam  Patient is alert, oriented, no adenopathy, well-dressed, normal affect, normal respiratory effort. Left amputation stump no cellulitis no swelling he does have a callused area at the end of the stump but does not probe deeply there is no surrounding cellulitis or signs of infection On the right amputation stump he has on the medial knee he does have an area of some increased redness secondary to pressure but there is no fluctuance there is no cellulitis no signs of infection  Imaging: No results found. No images are attached to the encounter.  Labs: Lab Results  Component Value Date   HGBA1C 5.9 11/17/2016   HGBA1C 8.2 08/18/2016   HGBA1C 7.8 (H)  05/17/2016     Lab Results  Component Value Date   ALBUMIN 2.5 (L) 05/11/2017   ALBUMIN 4.1 05/17/2016   ALBUMIN 4.3 10/09/2015    Lab Results  Component Value Date   MG 1.9 08/15/2011   MG 2.0 08/14/2011   No results found for: VD25OH  No results found for: PREALBUMIN CBC EXTENDED Latest Ref Rng & Units 05/11/2017 05/17/2016 10/09/2015  WBC 4.0 - 10.5 K/uL 22.5(H) 8.0 9.0  RBC 4.22 - 5.81 MIL/uL 4.55 4.52 4.65  HGB 13.0 - 17.0 g/dL 12.6(L) 13.3 13.8  HCT 39.0 - 52.0 % 37.2(L) 38.7(L) 40.5  PLT 150 - 400 K/uL 305 292.0 324.0  NEUTROABS 1.7 - 7.7 K/uL 19.7(H) 4.3 5.5  LYMPHSABS 0.7 - 4.0 K/uL 0.7 2.1 2.2     There is no height or weight on file to calculate BMI.  Orders:  No orders of the defined types were placed in this encounter.  No orders of the defined types were placed in this encounter.    Procedures: No procedures performed  Clinical Data: No additional findings.  ROS:  All other systems negative, except as noted in the HPI. Review of Systems  Objective: Vital Signs: There were no vitals taken for this visit.  Specialty Comments:  No specialty comments available.  PMFS History: Patient Active Problem List   Diagnosis Date Noted  . S/P bilateral below knee amputation ([place]) 04/26/2018  . Diabetic neuropathy ([place]) 10/22/2015  . Chronic systolic heart failure ([place]) 08/24/2011  . HTN (hypertension), malignant 08/14/2011  . Diabetes mellitus type 2, noninsulin dependent ([place]) 08/14/2011   Past Medical History:  Diagnosis Date  . Allergy   . Arthritis   . CHF (congestive heart failure) (HCC)    Systolic - LVEF 93-23  (55/7322)   . Coronary artery disease    Nonobstructive disease per cath 08/2011  . Diabetes mellitus   . GERD (gastroesophageal reflux disease)   . HTN (hypertension)   . Hyperlipidemia   . Neuromuscular disorder (HCC)    Nerve damage    Family History  Problem Relation Age of Onset  . Diabetes Father   . Heart failure Father    . Cancer Mother        lymphoma  . Diabetes Mother   . Colon cancer Neg Hx   . Esophageal cancer Neg Hx   . Rectal cancer Neg Hx   . Stomach cancer Neg Hx     Past Surgical History:  Procedure Laterality Date  . APPENDECTOMY    . BELOW KNEE LEG AMPUTATION Bilateral   . COLONOSCOPY    . LEFT AND RIGHT HEART CATHETERIZATION WITH CORONARY ANGIOGRAM N/A 08/17/2011   Procedure: LEFT AND RIGHT HEART CATHETERIZATION WITH CORONARY ANGIOGRAM;  Surgeon: Sherren Mocha, MD;  Location: [facility] CATH LAB;  Service: Cardiovascular;  Laterality: N/A;  . POLYPECTOMY     Social History   Occupational History  . Occupation: salesman  Tobacco Use  . Smoking status: Former Smoker    Packs/day: 0.70    Years: 15.00    Pack years: 10.50    Quit date: 02/08/2006    Years since quitting: 14.1  . Smokeless tobacco: Never Used  Substance and Sexual Activity  . Alcohol use: Yes    Comment: rare  . Drug use: No  . Sexual activity: Not on file

## 2020-05-29 ENCOUNTER — Encounter: Payer: Self-pay | Admitting: Orthopedic Surgery

## 2020-05-29 ENCOUNTER — Ambulatory Visit (INDEPENDENT_AMBULATORY_CARE_PROVIDER_SITE_OTHER): Payer: 59 | Admitting: Orthopedic Surgery

## 2020-05-29 DIAGNOSIS — Z89511 Acquired absence of right leg below knee: Secondary | ICD-10-CM | POA: Diagnosis not present

## 2020-05-29 DIAGNOSIS — Z89512 Acquired absence of left leg below knee: Secondary | ICD-10-CM | POA: Diagnosis not present

## 2020-05-29 NOTE — Progress Notes (Signed)
Office Visit Note   Patient: Dustin Lozano           Date of Birth: 04/20/1960           MRN: [id number] Visit Date: 05/29/2020              Requested by: Deborah Chalk, FNP [address] [place],  [place] [address] PCP: Deborah Chalk, FNP  Chief Complaint  Patient presents with  . Left Knee - Follow-up  . Right Knee - Follow-up      HPI: Patient is a 60 year old gentleman who presents in follow-up for bilateral below-knee amputations.  Patient had an ulcer proximal medial on the right knee and distal of the left residual limb.  Patient states he has had new sockets fabricated new liners.  He feels like he is making good progress.  Assessment & Plan: Visit Diagnoses:  1. S/P bilateral below knee amputation ([place])     Plan: Continue  Areas new prosthesis follow-up if there is any new ulcerations.  Follow-Up Instructions: Return if symptoms worsen or fail to improve.   Ortho Exam  Patient is alert, oriented, no adenopathy, well-dressed, normal affect, normal respiratory effort. Examination the ulcer proximal medial on the right knee has completely healed there is no redness or cellulitis of the right residual limb.  Left residual limb has almost completely healed the end bearing ulcer this is approximately 5 mm in diameter with good granulation tissue there is no depth no exposed bone or tendon no drainage no cellulitis.  Imaging: No results found. No images are attached to the encounter.  Labs: Lab Results  Component Value Date   HGBA1C 5.9 11/17/2016   HGBA1C 8.2 08/18/2016   HGBA1C 7.8 (H) 05/17/2016     Lab Results  Component Value Date   ALBUMIN 2.5 (L) 05/11/2017   ALBUMIN 4.1 05/17/2016   ALBUMIN 4.3 10/09/2015    Lab Results  Component Value Date   MG 1.9 08/15/2011   MG 2.0 08/14/2011   No results found for: VD25OH  No results found for: PREALBUMIN CBC EXTENDED Latest Ref Rng & Units 05/11/2017 05/17/2016 10/09/2015  WBC 4.0 - 10.5  K/uL 22.5(H) 8.0 9.0  RBC 4.22 - 5.81 MIL/uL 4.55 4.52 4.65  HGB 13.0 - 17.0 g/dL 12.6(L) 13.3 13.8  HCT 39.0 - 52.0 % 37.2(L) 38.7(L) 40.5  PLT 150 - 400 K/uL 305 292.0 324.0  NEUTROABS 1.7 - 7.7 K/uL 19.7(H) 4.3 5.5  LYMPHSABS 0.7 - 4.0 K/uL 0.7 2.1 2.2     There is no height or weight on file to calculate BMI.  Orders:  No orders of the defined types were placed in this encounter.  No orders of the defined types were placed in this encounter.    Procedures: No procedures performed  Clinical Data: No additional findings.  ROS:  All other systems negative, except as noted in the HPI. Review of Systems  Objective: Vital Signs: There were no vitals taken for this visit.  Specialty Comments:  No specialty comments available.  PMFS History: Patient Active Problem List   Diagnosis Date Noted  . S/P bilateral below knee amputation ([place]) 04/26/2018  . Diabetic neuropathy ([place]) 10/22/2015  . Chronic systolic heart failure ([place]) 08/24/2011  . HTN (hypertension), malignant 08/14/2011  . Diabetes mellitus type 2, noninsulin dependent ([place]) 08/14/2011   Past Medical History:  Diagnosis Date  . Allergy   . Arthritis   . CHF (congestive heart failure) ([place])  Systolic - LVEF 10-31  (59/4585)   . Coronary artery disease    Nonobstructive disease per cath 08/2011  . Diabetes mellitus   . GERD (gastroesophageal reflux disease)   . HTN (hypertension)   . Hyperlipidemia   . Neuromuscular disorder (HCC)    Nerve damage    Family History  Problem Relation Age of Onset  . Diabetes Father   . Heart failure Father   . Cancer Mother        lymphoma  . Diabetes Mother   . Colon cancer Neg Hx   . Esophageal cancer Neg Hx   . Rectal cancer Neg Hx   . Stomach cancer Neg Hx     Past Surgical History:  Procedure Laterality Date  . APPENDECTOMY    . BELOW KNEE LEG AMPUTATION Bilateral   . COLONOSCOPY    . LEFT AND RIGHT HEART CATHETERIZATION WITH CORONARY ANGIOGRAM N/A  08/17/2011   Procedure: LEFT AND RIGHT HEART CATHETERIZATION WITH CORONARY ANGIOGRAM;  Surgeon: Sherren Mocha, MD;  Location: [facility] CATH LAB;  Service: Cardiovascular;  Laterality: N/A;  . POLYPECTOMY     Social History   Occupational History  . Occupation: salesman  Tobacco Use  . Smoking status: Former Smoker    Packs/day: 0.70    Years: 15.00    Pack years: 10.50    Quit date: 02/08/2006    Years since quitting: 14.3  . Smokeless tobacco: Never Used  Substance and Sexual Activity  . Alcohol use: Yes    Comment: rare  . Drug use: No  . Sexual activity: Not on file

## 2020-11-06 ENCOUNTER — Ambulatory Visit (INDEPENDENT_AMBULATORY_CARE_PROVIDER_SITE_OTHER): Payer: 59 | Admitting: Physician Assistant

## 2020-11-06 ENCOUNTER — Encounter: Payer: Self-pay | Admitting: Orthopedic Surgery

## 2020-11-06 DIAGNOSIS — Z89512 Acquired absence of left leg below knee: Secondary | ICD-10-CM | POA: Diagnosis not present

## 2020-11-06 DIAGNOSIS — Z89511 Acquired absence of right leg below knee: Secondary | ICD-10-CM | POA: Diagnosis not present

## 2020-11-06 NOTE — Progress Notes (Signed)
Office Visit Note   Patient: Dustin Lozano           Date of Birth: Sep 29, 1960           MRN: [id number] Visit Date: 11/06/2020              Requested by: Deborah Chalk, FNP [address] [place],  [place] [address] PCP: Deborah Chalk, FNP  Chief Complaint  Patient presents with   Right Leg - Follow-up   Left Leg - Follow-up      HPI: Patient is a pleasant 60 year old gentleman who is a bilateral below-knee amputee.  He comes in today because he has been wearing 18-20 ply socks bilaterally.  His prosthetic is having some rubbing on the back of one of his knees.  He feels unstable with his prosthetics.  Assessment & Plan: Visit Diagnoses: No diagnosis found.  Plan: Prescription was provided for bilateral sockets and supplies.  Also gave prescription for size large vive shrinkers  Follow-Up Instructions: No follow-ups on file.   Ortho Exam  Patient is alert, oriented, no adenopathy, well-dressed, normal affect, normal respiratory effort. Examination demonstrates just a small abrasion without any surrounding cellulitis or skin breakdown just from the socket rubbing on 1 side.  This side is completely normal no cellulitis has lost significant volume not compatible with current sockets Patient is an existing bilateral transtibial  amputee.  Patient's current comorbidities are not expected to impact the ability to function with the prescribed prosthesis. Patient verbally communicates a strong desire to use a prosthesis. Patient currently requires mobility aids to ambulate without a prosthesis.  Expects not to use mobility aids with a new prosthesis.  Patient is a K3 level ambulator that spends a lot of time walking around on uneven terrain over obstacles, up and down stairs, and ambulates with a variable cadence.    Imaging: No results found. No images are attached to the encounter.  Labs: Lab Results  Component Value Date   HGBA1C 5.9 11/17/2016    HGBA1C 8.2 08/18/2016   HGBA1C 7.8 (H) 05/17/2016     Lab Results  Component Value Date   ALBUMIN 2.5 (L) 05/11/2017   ALBUMIN 4.1 05/17/2016   ALBUMIN 4.3 10/09/2015    Lab Results  Component Value Date   MG 1.9 08/15/2011   MG 2.0 08/14/2011   No results found for: VD25OH  No results found for: PREALBUMIN CBC EXTENDED Latest Ref Rng & Units 05/11/2017 05/17/2016 10/09/2015  WBC 4.0 - 10.5 K/uL 22.5(H) 8.0 9.0  RBC 4.22 - 5.81 MIL/uL 4.55 4.52 4.65  HGB 13.0 - 17.0 g/dL 12.6(L) 13.3 13.8  HCT 39.0 - 52.0 % 37.2(L) 38.7(L) 40.5  PLT 150 - 400 K/uL 305 292.0 324.0  NEUTROABS 1.7 - 7.7 K/uL 19.7(H) 4.3 5.5  LYMPHSABS 0.7 - 4.0 K/uL 0.7 2.1 2.2     There is no height or weight on file to calculate BMI.  Orders:  No orders of the defined types were placed in this encounter.  No orders of the defined types were placed in this encounter.    Procedures: No procedures performed  Clinical Data: No additional findings.  ROS:  All other systems negative, except as noted in the HPI. Review of Systems  Objective: Vital Signs: There were no vitals taken for this visit.  Specialty Comments:  No specialty comments available.  PMFS History: Patient Active Problem List   Diagnosis Date Noted   S/P bilateral below knee  amputation ([place]) 04/26/2018   Diabetic neuropathy ([place]) 69/67/8938   Chronic systolic heart failure ([place]) 08/24/2011   HTN (hypertension), malignant 08/14/2011   Diabetes mellitus type 2, noninsulin dependent ([place]) 08/14/2011   Past Medical History:  Diagnosis Date   Allergy    Arthritis    CHF (congestive heart failure) (HCC)    Systolic - LVEF 10-17  (51/0258)    Coronary artery disease    Nonobstructive disease per cath 08/2011   Diabetes mellitus    GERD (gastroesophageal reflux disease)    HTN (hypertension)    Hyperlipidemia    Neuromuscular disorder (HCC)    Nerve damage    Family History  Problem Relation Age of Onset   Diabetes Father     Heart failure Father    Cancer Mother        lymphoma   Diabetes Mother    Colon cancer Neg Hx    Esophageal cancer Neg Hx    Rectal cancer Neg Hx    Stomach cancer Neg Hx     Past Surgical History:  Procedure Laterality Date   APPENDECTOMY     BELOW KNEE LEG AMPUTATION Bilateral    COLONOSCOPY     LEFT AND RIGHT HEART CATHETERIZATION WITH CORONARY ANGIOGRAM N/A 08/17/2011   Procedure: LEFT AND RIGHT HEART CATHETERIZATION WITH CORONARY ANGIOGRAM;  Surgeon: Sherren Mocha, MD;  Location: [facility] CATH LAB;  Service: Cardiovascular;  Laterality: N/A;   POLYPECTOMY     Social History   Occupational History   Occupation: salesman  Tobacco Use   Smoking status: Former    Packs/day: 0.70    Years: 15.00    Pack years: 10.50    Types: Cigarettes    Quit date: 02/08/2006    Years since quitting: 14.7   Smokeless tobacco: Never  Substance and Sexual Activity   Alcohol use: Yes    Comment: rare   Drug use: No   Sexual activity: Not on file

## 2021-11-19 ENCOUNTER — Encounter: Payer: Self-pay | Admitting: Orthopedic Surgery

## 2021-11-19 ENCOUNTER — Ambulatory Visit (INDEPENDENT_AMBULATORY_CARE_PROVIDER_SITE_OTHER): Payer: 59 | Admitting: Orthopedic Surgery

## 2021-11-19 DIAGNOSIS — Z89512 Acquired absence of left leg below knee: Secondary | ICD-10-CM

## 2021-11-19 DIAGNOSIS — Z89511 Acquired absence of right leg below knee: Secondary | ICD-10-CM | POA: Diagnosis not present

## 2021-11-19 NOTE — Progress Notes (Signed)
Office Visit Note   Patient: Dustin Lozano           Date of Birth: 1960-04-20           MRN: [id number] Visit Date: 11/19/2021              Requested by: Deborah Chalk, FNP [address] [place],  [place] [address] PCP: Deborah Chalk, FNP  Chief Complaint  Patient presents with   Left Leg - Follow-up    BKA   Right Leg - Follow-up    BKA      HPI: Patient is a 61 year old gentleman with bilateral transtibial amputation.  Patient has been increasing problems with subsiding into the socket with end bearing ulceration wearing increased ply sock as well as socket liners.  Assessment & Plan: Visit Diagnoses:  1. S/P bilateral below knee amputation [facility])     Plan: Patient was provided a prescription for [employer] for new foot ankle socket liners and compression socks.  Follow-Up Instructions: Return if symptoms worsen or fail to improve.   Ortho Exam  Patient is alert, oriented, no adenopathy, well-dressed, normal affect, normal respiratory effort. Examination patient has redness over the residual limb secondary to subsiding in his socket.  There is no full-thickness ulcers at this time there is no dermatitis no rashes.  There is also pressure over the patella from subsiding into the socket.  Patient is currently wearing multiple ply sock and has liners placed in his socket.  The foot and ankle is also broken.  Patient is an existing bilateral transtibial  amputee.  Patient's current comorbidities are not expected to impact the ability to function with the prescribed prosthesis. Patient verbally communicates a strong desire to use a prosthesis. Patient currently requires mobility aids to ambulate without a prosthesis.  Expects not to use mobility aids with a new prosthesis.  Patient is a K3 level ambulator that spends a lot of time walking around on uneven terrain over obstacles, up and down stairs, and ambulates with a variable cadence.     Imaging: No  results found. No images are attached to the encounter.  Labs: Lab Results  Component Value Date   HGBA1C 5.9 11/17/2016   HGBA1C 8.2 08/18/2016   HGBA1C 7.8 (H) 05/17/2016     Lab Results  Component Value Date   ALBUMIN 2.5 (L) 05/11/2017   ALBUMIN 4.1 05/17/2016   ALBUMIN 4.3 10/09/2015    Lab Results  Component Value Date   MG 1.9 08/15/2011   MG 2.0 08/14/2011   No results found for: "VD25OH"  No results found for: "PREALBUMIN"    Latest Ref Rng & Units 05/11/2017   12:07 PM 05/17/2016    8:25 AM 10/09/2015    9:15 AM  CBC EXTENDED  WBC 4.0 - 10.5 K/uL 22.5  8.0  9.0   RBC 4.22 - 5.81 MIL/uL 4.55  4.52  4.65   Hemoglobin 13.0 - 17.0 g/dL 12.6  13.3  13.8   HCT 39.0 - 52.0 % 37.2  38.7  40.5   Platelets 150 - 400 K/uL 305  292.0  324.0   NEUT# 1.7 - 7.7 K/uL 19.7  4.3  5.5   Lymph# 0.7 - 4.0 K/uL 0.7  2.1  2.2      There is no height or weight on file to calculate BMI.  Orders:  No orders of the defined types were placed in this encounter.  No orders of the defined types  were placed in this encounter.    Procedures: No procedures performed  Clinical Data: No additional findings.  ROS:  All other systems negative, except as noted in the HPI. Review of Systems  Objective: Vital Signs: There were no vitals taken for this visit.  Specialty Comments:  No specialty comments available.  PMFS History: Patient Active Problem List   Diagnosis Date Noted   S/P bilateral below knee amputation ([place]) 04/26/2018   Diabetic neuropathy ([place]) 23/55/7322   Chronic systolic heart failure ([place]) 08/24/2011   HTN (hypertension), malignant 08/14/2011   Diabetes mellitus type 2, noninsulin dependent ([place]) 08/14/2011   Past Medical History:  Diagnosis Date   Allergy    Arthritis    CHF (congestive heart failure) (HCC)    Systolic - LVEF 02-54  (27/0623)    Coronary artery disease    Nonobstructive disease per cath 08/2011   Diabetes mellitus    GERD  (gastroesophageal reflux disease)    HTN (hypertension)    Hyperlipidemia    Neuromuscular disorder (HCC)    Nerve damage    Family History  Problem Relation Age of Onset   Diabetes Father    Heart failure Father    Cancer Mother        lymphoma   Diabetes Mother    Colon cancer Neg Hx    Esophageal cancer Neg Hx    Rectal cancer Neg Hx    Stomach cancer Neg Hx     Past Surgical History:  Procedure Laterality Date   APPENDECTOMY     BELOW KNEE LEG AMPUTATION Bilateral    COLONOSCOPY     LEFT AND RIGHT HEART CATHETERIZATION WITH CORONARY ANGIOGRAM N/A 08/17/2011   Procedure: LEFT AND RIGHT HEART CATHETERIZATION WITH CORONARY ANGIOGRAM;  Surgeon: Sherren Mocha, MD;  Location: [facility] CATH LAB;  Service: Cardiovascular;  Laterality: N/A;   POLYPECTOMY     Social History   Occupational History   Occupation: salesman  Tobacco Use   Smoking status: Former    Packs/day: 0.70    Years: 15.00    Total pack years: 10.50    Types: Cigarettes    Quit date: 02/08/2006    Years since quitting: 15.7   Smokeless tobacco: Never  Substance and Sexual Activity   Alcohol use: Yes    Comment: rare   Drug use: No   Sexual activity: Not on file

## 2022-04-08 ENCOUNTER — Other Ambulatory Visit: Payer: Self-pay | Admitting: Otolaryngology

## 2022-04-13 ENCOUNTER — Encounter (HOSPITAL_BASED_OUTPATIENT_CLINIC_OR_DEPARTMENT_OTHER): Payer: Self-pay | Admitting: Otolaryngology

## 2022-04-13 ENCOUNTER — Other Ambulatory Visit: Payer: Self-pay

## 2022-04-14 ENCOUNTER — Encounter (HOSPITAL_BASED_OUTPATIENT_CLINIC_OR_DEPARTMENT_OTHER)
Admission: RE | Admit: 2022-04-14 | Discharge: 2022-04-14 | Disposition: A | Discharge status: Home / Self Care | Payer: 59 | Source: Ambulatory Visit | Attending: Otolaryngology | Admitting: Otolaryngology

## 2022-04-14 DIAGNOSIS — Z01812 Encounter for preprocedural laboratory examination: Secondary | ICD-10-CM | POA: Insufficient documentation

## 2022-04-14 LAB — BASIC METABOLIC PANEL
Anion gap: 9 (ref 5–15)
BUN: 26 mg/dL — ABNORMAL HIGH (ref 6–20)
CO2: 23 mmol/L (ref 22–32)
Calcium: 8.7 mg/dL — ABNORMAL LOW (ref 8.9–10.3)
Chloride: 104 mmol/L (ref 98–111)
Creatinine, Ser: 2.12 mg/dL — ABNORMAL HIGH (ref 0.61–1.24)
GFR, Estimated: 35 mL/min — ABNORMAL LOW (ref >60)
Glucose, Bld: 230 mg/dL — ABNORMAL HIGH (ref 70–99)
Potassium: 4.6 mmol/L (ref 3.5–5.1)
Sodium: 136 mmol/L (ref 135–145)

## 2022-04-14 NOTE — Progress Notes (Signed)
EKG reviewed by Valma Cava, MD who approved moving forward with surgery

## 2022-04-14 NOTE — Progress Notes (Signed)

## 2022-04-20 NOTE — Progress Notes (Signed)
Labs okay for DOS per oddonno

## 2022-04-21 ENCOUNTER — Other Ambulatory Visit (HOSPITAL_BASED_OUTPATIENT_CLINIC_OR_DEPARTMENT_OTHER): Payer: Self-pay

## 2022-04-21 ENCOUNTER — Ambulatory Visit (HOSPITAL_BASED_OUTPATIENT_CLINIC_OR_DEPARTMENT_OTHER): Payer: 59 | Admitting: Certified Registered Nurse Anesthetist

## 2022-04-21 ENCOUNTER — Encounter (HOSPITAL_BASED_OUTPATIENT_CLINIC_OR_DEPARTMENT_OTHER)
Admission: RE | Disposition: A | Discharge status: Home / Self Care | Payer: Self-pay | Source: Ambulatory Visit | Attending: Otolaryngology

## 2022-04-21 ENCOUNTER — Ambulatory Visit (HOSPITAL_BASED_OUTPATIENT_CLINIC_OR_DEPARTMENT_OTHER)
Admission: RE | Admit: 2022-04-21 | Discharge: 2022-04-21 | Disposition: A | Discharge status: Home / Self Care | Payer: 59 | Source: Ambulatory Visit | Attending: Otolaryngology | Admitting: Otolaryngology

## 2022-04-21 ENCOUNTER — Encounter (HOSPITAL_BASED_OUTPATIENT_CLINIC_OR_DEPARTMENT_OTHER): Payer: Self-pay | Admitting: Otolaryngology

## 2022-04-21 ENCOUNTER — Other Ambulatory Visit: Payer: Self-pay

## 2022-04-21 DIAGNOSIS — Z7985 Long-term (current) use of injectable non-insulin antidiabetic drugs: Secondary | ICD-10-CM | POA: Diagnosis not present

## 2022-04-21 DIAGNOSIS — I509 Heart failure, unspecified: Secondary | ICD-10-CM | POA: Diagnosis not present

## 2022-04-21 DIAGNOSIS — E119 Type 2 diabetes mellitus without complications: Secondary | ICD-10-CM | POA: Diagnosis not present

## 2022-04-21 DIAGNOSIS — Z7984 Long term (current) use of oral hypoglycemic drugs: Secondary | ICD-10-CM | POA: Diagnosis not present

## 2022-04-21 DIAGNOSIS — Z01818 Encounter for other preprocedural examination: Secondary | ICD-10-CM

## 2022-04-21 DIAGNOSIS — H02831 Dermatochalasis of right upper eyelid: Secondary | ICD-10-CM | POA: Diagnosis present

## 2022-04-21 DIAGNOSIS — I110 Hypertensive heart disease with heart failure: Secondary | ICD-10-CM

## 2022-04-21 DIAGNOSIS — H02834 Dermatochalasis of left upper eyelid: Secondary | ICD-10-CM

## 2022-04-21 DIAGNOSIS — K219 Gastro-esophageal reflux disease without esophagitis: Secondary | ICD-10-CM | POA: Diagnosis not present

## 2022-04-21 DIAGNOSIS — I2510 Atherosclerotic heart disease of native coronary artery without angina pectoris: Secondary | ICD-10-CM

## 2022-04-21 DIAGNOSIS — Z87891 Personal history of nicotine dependence: Secondary | ICD-10-CM | POA: Diagnosis not present

## 2022-04-21 HISTORY — PX: BROW LIFT: SHX178

## 2022-04-21 LAB — GLUCOSE, CAPILLARY
Glucose-Capillary: 141 mg/dL — ABNORMAL HIGH (ref 70–99)
Glucose-Capillary: 129 mg/dL — ABNORMAL HIGH (ref 70–99)

## 2022-04-21 SURGERY — MINOR BLEPHAROPLASTY
Anesthesia: General | Site: Eye | Laterality: Bilateral

## 2022-04-21 MED ORDER — TETRACAINE HCL 0.5 % OP SOLN
OPHTHALMIC | Status: AC
  Filled 2022-04-21: qty 4

## 2022-04-21 MED ORDER — PROPOFOL 500 MG/50ML IV EMUL
INTRAVENOUS | Status: DC | PRN
  Administered 2022-04-21: 100 ug/kg/min via INTRAVENOUS

## 2022-04-21 MED ORDER — LIDOCAINE 2% (20 MG/ML) 5 ML SYRINGE
INTRAMUSCULAR | Status: AC
  Filled 2022-04-21: qty 5

## 2022-04-21 MED ORDER — FENTANYL CITRATE (PF) 100 MCG/2ML IJ SOLN
INTRAMUSCULAR | Status: AC
  Filled 2022-04-21: qty 2

## 2022-04-21 MED ORDER — LIDOCAINE-EPINEPHRINE 1 %-1:100000 IJ SOLN
INTRAMUSCULAR | Status: DC | PRN
  Administered 2022-04-21: 4 mL

## 2022-04-21 MED ORDER — ERYTHROMYCIN 5 MG/GM OP OINT
TOPICAL_OINTMENT | OPHTHALMIC | 0 refills | Status: DC
  Filled 2022-04-21: qty 3.5, 7d supply, fill #0

## 2022-04-21 MED ORDER — BSS IO SOLN
INTRAOCULAR | Status: AC
  Filled 2022-04-21: qty 15

## 2022-04-21 MED ORDER — FENTANYL CITRATE (PF) 100 MCG/2ML IJ SOLN
INTRAMUSCULAR | Status: DC | PRN
  Administered 2022-04-21: 50 ug via INTRAVENOUS

## 2022-04-21 MED ORDER — FENTANYL CITRATE (PF) 100 MCG/2ML IJ SOLN: 25.0000 ug | INTRAMUSCULAR | Status: DC | PRN

## 2022-04-21 MED ORDER — TOBRAMYCIN 0.3 % OP OINT
TOPICAL_OINTMENT | OPHTHALMIC | Status: DC | PRN
  Administered 2022-04-21: 1 via OPHTHALMIC

## 2022-04-21 MED ORDER — ACETAMINOPHEN 500 MG PO TABS
1000.0000 mg | ORAL_TABLET | Freq: Once | ORAL | Status: AC
  Administered 2022-04-21: 1000 mg via ORAL

## 2022-04-21 MED ORDER — MIDAZOLAM HCL 5 MG/5ML IJ SOLN
INTRAMUSCULAR | Status: DC | PRN
  Administered 2022-04-21 (×2): 1 mg via INTRAVENOUS

## 2022-04-21 MED ORDER — CEFAZOLIN SODIUM-DEXTROSE 2-4 GM/100ML-% IV SOLN
2.0000 g | INTRAVENOUS | Status: AC
  Administered 2022-04-21: 2 g via INTRAVENOUS

## 2022-04-21 MED ORDER — SODIUM BICARBONATE 4.2 % IV SOLN
INTRAVENOUS | Status: AC
  Filled 2022-04-21: qty 10

## 2022-04-21 MED ORDER — ONDANSETRON HCL 4 MG/2ML IJ SOLN
INTRAMUSCULAR | Status: AC
  Filled 2022-04-21: qty 2

## 2022-04-21 MED ORDER — MIDAZOLAM HCL 2 MG/2ML IJ SOLN
INTRAMUSCULAR | Status: AC
  Filled 2022-04-21: qty 2

## 2022-04-21 MED ORDER — CEFAZOLIN SODIUM-DEXTROSE 2-4 GM/100ML-% IV SOLN
INTRAVENOUS | Status: AC
  Filled 2022-04-21: qty 100

## 2022-04-21 MED ORDER — ACETAMINOPHEN 500 MG PO TABS
ORAL_TABLET | ORAL | Status: AC
  Filled 2022-04-21: qty 2

## 2022-04-21 MED ORDER — LACTATED RINGERS IV SOLN: INTRAVENOUS | Status: DC

## 2022-04-21 MED ORDER — ONDANSETRON HCL 4 MG/2ML IJ SOLN
INTRAMUSCULAR | Status: DC | PRN
  Administered 2022-04-21: 4 mg via INTRAVENOUS

## 2022-04-21 MED ORDER — PROPOFOL 500 MG/50ML IV EMUL
INTRAVENOUS | Status: AC
  Filled 2022-04-21: qty 50

## 2022-04-21 MED ORDER — PROPOFOL 10 MG/ML IV BOLUS
INTRAVENOUS | Status: DC | PRN
  Administered 2022-04-21: 20 mg via INTRAVENOUS

## 2022-04-21 SURGICAL SUPPLY — 62 items
APL SRG 3 HI ABS STRL LF PLS (MISCELLANEOUS) ×1
APL SWBSTK 6 STRL LF DISP (MISCELLANEOUS)
APPLICATOR COTTON TIP 6 STRL (MISCELLANEOUS) IMPLANT
APPLICATOR COTTON TIP 6IN STRL (MISCELLANEOUS)
APPLICATOR DR MATTHEWS STRL (MISCELLANEOUS) ×1 IMPLANT
BLADE SURG 11 STRL SS (BLADE) IMPLANT
BLADE SURG 15 STRL LF DISP TIS (BLADE) ×1 IMPLANT
BLADE SURG 15 STRL SS (BLADE) ×1
BNDG EYE OVAL 2 1/8 X 2 5/8 (GAUZE/BANDAGES/DRESSINGS) IMPLANT
CAUTERY EYE LOW TEMP OLD (MISCELLANEOUS) IMPLANT
CLEANER CAUTERY TIP 5X5 PAD (MISCELLANEOUS) IMPLANT
CNTNR URN SCR LID CUP LEK RST (MISCELLANEOUS) IMPLANT
CONT SPEC 4OZ STRL OR WHT (MISCELLANEOUS)
CORD BIPOLAR FORCEPS 12FT (ELECTRODE) IMPLANT
COVER BACK TABLE 60X90IN (DRAPES) ×1 IMPLANT
COVER MAYO STAND STRL (DRAPES) ×1 IMPLANT
DRAPE HALF SHEET 40X57 (DRAPES) ×1 IMPLANT
DRAPE SURG 17X23 STRL (DRAPES) IMPLANT
DRAPE U-SHAPE 76X120 STRL (DRAPES) ×1 IMPLANT
DRSG TEGADERM 2-3/8X2-3/4 SM (GAUZE/BANDAGES/DRESSINGS) ×1 IMPLANT
DRSG TELFA 3X8 NADH STRL (GAUZE/BANDAGES/DRESSINGS) IMPLANT
ELECT COATED BLADE 2.86 ST (ELECTRODE) IMPLANT
ELECT NDL BLADE 2-5/6 (NEEDLE) ×1 IMPLANT
ELECT NEEDLE BLADE 2-5/6 (NEEDLE) ×1 IMPLANT
ELECT REM PT RETURN 9FT ADLT (ELECTROSURGICAL) ×1
ELECTRODE REM PT RTRN 9FT ADLT (ELECTROSURGICAL) ×1 IMPLANT
FORCEPS BIPOLAR SPETZLER 8 1.0 (NEUROSURGERY SUPPLIES) IMPLANT
GAUZE XEROFORM 1X8 LF (GAUZE/BANDAGES/DRESSINGS) IMPLANT
GLOVE BIO SURGEON STRL SZ7.5 (GLOVE) ×1 IMPLANT
GLOVE BIOGEL PI IND STRL 8 (GLOVE) ×1 IMPLANT
GOWN STRL REUS W/ TWL LRG LVL3 (GOWN DISPOSABLE) ×1 IMPLANT
GOWN STRL REUS W/ TWL XL LVL3 (GOWN DISPOSABLE) ×1 IMPLANT
GOWN STRL REUS W/TWL LRG LVL3 (GOWN DISPOSABLE) ×1
GOWN STRL REUS W/TWL XL LVL3 (GOWN DISPOSABLE) ×1
MARKER SKIN DUAL TIP RULER LAB (MISCELLANEOUS) IMPLANT
NDL HYPO 27GX1-1/4 (NEEDLE) IMPLANT
NDL HYPO 30GX1 BEV (NEEDLE) IMPLANT
NDL HYPO 30X.5 LL (NEEDLE) IMPLANT
NEEDLE HYPO 27GX1-1/4 (NEEDLE) IMPLANT
NEEDLE HYPO 30GX1 BEV (NEEDLE) IMPLANT
NEEDLE HYPO 30X.5 LL (NEEDLE) IMPLANT
NS IRRIG 1000ML POUR BTL (IV SOLUTION) ×1 IMPLANT
PACK BASIN DAY SURGERY FS (CUSTOM PROCEDURE TRAY) ×1 IMPLANT
PENCIL SMOKE EVACUATOR (MISCELLANEOUS) ×1 IMPLANT
SHEILD EYE MED CORNL SHD 22X21 (OPHTHALMIC RELATED) ×1
SHIELD EYE MED CORNL SHD 22X21 (OPHTHALMIC RELATED) ×1 IMPLANT
SLEEVE SCD COMPRESS KNEE MED (STOCKING) IMPLANT
SUT ETHILON 2 0 FS 18 (SUTURE) IMPLANT
SUT MERSILENE 4 0 P 3 (SUTURE) IMPLANT
SUT MNCRL 6-0 UNDY P1 1X18 (SUTURE) IMPLANT
SUT MONOCRYL 6-0 P1 1X18 (SUTURE)
SUT PLAIN 5 0 P 3 18 (SUTURE) IMPLANT
SUT PLAIN GUT FAST 5-0 (SUTURE) IMPLANT
SUT PROLENE 6 0 P 1 18 (SUTURE) IMPLANT
SUT PROLENE 6 0 PC 1 (SUTURE) IMPLANT
SUT VIC AB 5-0 P-3 18X BRD (SUTURE) IMPLANT
SUT VIC AB 5-0 P3 18 (SUTURE)
SUT VICRYL 4-0 PS2 18IN ABS (SUTURE) IMPLANT
SYR 3ML 23GX1 SAFETY (SYRINGE) IMPLANT
SYR CONTROL 10ML LL (SYRINGE) IMPLANT
TOWEL GREEN STERILE FF (TOWEL DISPOSABLE) ×1 IMPLANT
TUBE CONNECTING 20X1/4 (TUBING) IMPLANT

## 2022-04-21 NOTE — H&P (Signed)
Dustin Lozano is an 62 y.o. male.    Chief Complaint:  Dermatochalasis  HPI: Patient presents today for planned elective procedure.  He/she denies any interval change in history since office visit on 03/05/22.  Past Medical History:  Diagnosis Date   Allergy    Arthritis    CHF (congestive heart failure) (HCC)    Systolic - LVEF 123456  (0000000)    Coronary artery disease    Nonobstructive disease per cath 08/2011   Diabetes mellitus    GERD (gastroesophageal reflux disease)    HTN (hypertension)    Hyperlipidemia    Neuromuscular disorder (HCC)    Nerve damage    Past Surgical History:  Procedure Laterality Date   APPENDECTOMY     BELOW KNEE LEG AMPUTATION Bilateral    COLONOSCOPY     LEFT AND RIGHT HEART CATHETERIZATION WITH CORONARY ANGIOGRAM N/A 08/17/2011   Procedure: LEFT AND RIGHT HEART CATHETERIZATION WITH CORONARY ANGIOGRAM;  Surgeon: Sherren Mocha, MD;  Location: [facility] CATH LAB;  Service: Cardiovascular;  Laterality: N/A;   POLYPECTOMY      Family History  Problem Relation Age of Onset   Diabetes Father    Heart failure Father    Cancer Mother        lymphoma   Diabetes Mother    Colon cancer Neg Hx    Esophageal cancer Neg Hx    Rectal cancer Neg Hx    Stomach cancer Neg Hx     Social History:  reports that he quit smoking about 16 years ago. His smoking use included cigarettes. He has a 10.50 pack-year smoking history. He has never used smokeless tobacco. He reports current alcohol use. He reports that he does not use drugs.  Allergies: No Known Allergies  Medications Prior to Admission  Medication Sig Dispense Refill   ACCU-CHEK FASTCLIX LANCETS MISC 1 Device by Does not apply route 2 (two) times daily. 100 each 12   acetaminophen (TYLENOL) 325 MG tablet Take 650 mg by mouth every 6 (six) hours as needed.     aspirin EC 81 MG tablet Take 81 mg by mouth daily.     atorvastatin (LIPITOR) 20 MG tablet Take 20 mg by mouth daily.     carvedilol (COREG) 12.5  MG tablet Take 1 tablet (12.5 mg total) by mouth 2 (two) times daily with a meal. Take one tablet daily in the AM. 180 tablet 1   clotrimazole-betamethasone (LOTRISONE) cream Apply 1 application topically 2 (two) times daily. 30 g 0   doxylamine, Sleep, (UNISOM) 25 MG tablet Take 25 mg by mouth at bedtime as needed for sleep.     Dulaglutide (TRULICITY [place]) Inject 1.5 mg into the skin once a week.     Emollient (RESTA) CREA Apply topically. Apply to dry skin on residual limbs as directed     FREESTYLE LITE test strip USE ONE STRIP TO CHECK GLUCOSE TWICE DAILY. 100 each 2   lisinopril (PRINIVIL,ZESTRIL) 2.5 MG tablet Take 2.5 mg by mouth daily.     metFORMIN (GLUCOPHAGE-XR) 500 MG 24 hr tablet Take 500 mg by mouth 2 (two) times daily.     Multiple Vitamins-Minerals (ONE-A-DAY MENS 50+ ADVANTAGE) TABS Take 1 tablet by mouth daily.     tamsulosin (FLOMAX) 0.4 MG CAPS capsule Take 0.4 mg by mouth.     zolpidem (AMBIEN) 10 MG tablet Take 10 mg by mouth at bedtime as needed for sleep.     Insulin Pen Needle (BD PEN NEEDLE NANO  U/F) 32G X 4 MM MISC Test blood sugars as directed. (Patient not taking: Reported on 07/10/2018) 30 each 0    Results for orders placed or performed during the hospital encounter of 04/21/22 (from the past 48 hour(s))  Glucose, capillary     Status: Abnormal   Collection Time: 04/21/22  9:59 AM  Result Value Ref Range   Glucose-Capillary 141 (H) 70 - 99 mg/dL    Comment: Glucose reference range applies only to samples taken after fasting for at least 8 hours.   No results found.  ROS: negative other than stated in HPI  Blood pressure (!) 127/90, pulse 83, temperature (!) 97.2 F (36.2 C), temperature source Oral, resp. rate 18, height '5\' 11"'$  (1.803 m), weight 90 kg, SpO2 100 %.  PHYSICAL EXAM: General: Resting comfortably in NAD  Lungs: Non-labored respiratinos  Studies Reviewed: none.   Assessment/Plan Upper eyelid dermatochalasis causing visual field  disturbance.  Proceed with bilateral upper eyelid blepharoplasty under MAC. Informed consent obtained. R/B/A discussed including risks of pain, bleeding, retrobulbar hematoma, infection, corneal abrasion, scarring, numbness, lagophthalmos, dry eye, poor cosmesis, need for further procedures, risks of anesthesia.   Electronically signed by:  [name], MD  Staff Physician Facial Plastic & Reconstructive Surgery Otolaryngology - Head and Neck Surgery [facility] [place]  04/21/2022, 10:15 AM

## 2022-04-21 NOTE — Anesthesia Preprocedure Evaluation (Addendum)
Anesthesia Evaluation  Patient identified by MRN, date of birth, ID band Patient awake    Reviewed: Allergy & Precautions, NPO status , Patient's Chart, lab work & pertinent test results, reviewed documented beta blocker date and time   Airway Mallampati: III  TM Distance: >3 FB Neck ROM: Full    Dental no notable dental hx. (+) Teeth Intact, Dental Advisory Given   Pulmonary former smoker   Pulmonary exam normal breath sounds clear to auscultation       Cardiovascular hypertension, Pt. on medications and Pt. on home beta blockers + CAD and +CHF  Normal cardiovascular exam Rhythm:Regular Rate:Normal     Neuro/Psych negative neurological ROS  negative psych ROS   GI/Hepatic Neg liver ROS,GERD  ,,  Endo/Other  diabetes, Type 2, Oral Hypoglycemic Agents    Renal/GU negative Renal ROS  negative genitourinary   Musculoskeletal  (+) Arthritis ,    Abdominal   Peds  Hematology negative hematology ROS (+)   Anesthesia Other Findings   Reproductive/Obstetrics                             Anesthesia Physical Anesthesia Plan  ASA: 3  Anesthesia Plan: MAC   Post-op Pain Management: Tylenol PO (pre-op)*   Induction: Intravenous  PONV Risk Score and Plan: 1 and Ondansetron, Dexamethasone and Midazolam  Airway Management Planned: Natural Airway and Simple Face Mask  Additional Equipment:   Intra-op Plan:   Post-operative Plan:   Informed Consent: I have reviewed the patients History and Physical, chart, labs and discussed the procedure including the risks, benefits and alternatives for the proposed anesthesia with the patient or authorized representative who has indicated his/her understanding and acceptance.     Dental advisory given  Plan Discussed with: CRNA  Anesthesia Plan Comments:        Anesthesia Quick Evaluation

## 2022-04-21 NOTE — Anesthesia Postprocedure Evaluation (Signed)
Anesthesia Post Note  Patient: Dustin Lozano  Procedure(s) Performed: UPPER EYELID BLEPHAROPLASTY (Bilateral: Eye)     Patient location during evaluation: PACU Anesthesia Type: General Level of consciousness: awake Pain management: pain level controlled Vital Signs Assessment: post-procedure vital signs reviewed and stable Respiratory status: spontaneous breathing, nonlabored ventilation and respiratory function stable Cardiovascular status: stable and blood pressure returned to baseline Postop Assessment: no apparent nausea or vomiting Anesthetic complications: no   No notable events documented.  Last Vitals:  Vitals:   04/21/22 1415 04/21/22 1430  BP: (!) 149/88 (!) 158/96  Pulse: 77 72  Resp: 15 16  Temp:  (!) 36.2 C  SpO2: 94% 96%    Last Pain:  Vitals:   04/21/22 1430  TempSrc:   PainSc: 2                  Nilda Simmer

## 2022-04-21 NOTE — Discharge Instructions (Addendum)
  Post Anesthesia Home Care Instructions  Activity: Get plenty of rest for the remainder of the day. A responsible individual must stay with you for 24 hours following the procedure.  For the next 24 hours, DO NOT: -Drive a car -Paediatric nurse -Drink alcoholic beverages -Take any medication unless instructed by your physician -Make any legal decisions or sign important papers.  Meals: Start with liquid foods such as gelatin or soup. Progress to regular foods as tolerated. Avoid greasy, spicy, heavy foods. If nausea and/or vomiting occur, drink only clear liquids until the nausea and/or vomiting subsides. Call your physician if vomiting continues.  Special Instructions/Symptoms: Your throat may feel dry or sore from the anesthesia or the breathing tube placed in your throat during surgery. If this causes discomfort, gargle with warm salt water. The discomfort should disappear within 24 hours.   May have tylenol again after 730pm     Place eye ointment twice daily on the eyelid incisions. Use ice packs 20 minutes on/20 minutes off for the next several days to help with bruising. F/u with Dr. Sabino Gasser in the office for suture removal in 1 week.  [facility] [phone] [address] [place], [place] [address] Phone: [phone]

## 2022-04-21 NOTE — Transfer of Care (Signed)
Immediate Anesthesia Transfer of Care Note  Patient: Dustin Lozano  Procedure(s) Performed: UPPER EYELID BLEPHAROPLASTY (Bilateral: Eye)  Patient Location: PACU  Anesthesia Type:General  Level of Consciousness: awake, alert , and oriented  Airway & Oxygen Therapy: Patient Spontanous Breathing and Patient connected to face mask oxygen  Post-op Assessment: Report given to RN and Post -op Vital signs reviewed and stable  Post vital signs: Reviewed and stable  Last Vitals:  Vitals Value Taken Time  BP 110/77 04/21/22 1315  Temp    Pulse 81 04/21/22 1317  Resp 20 04/21/22 1317  SpO2 97 % 04/21/22 1317  Vitals shown include unvalidated device data.  Last Pain:  Vitals:   04/21/22 0956  TempSrc: Oral  PainSc: 0-No pain      Patients Stated Pain Goal: 3 ([id number])  Complications: No notable events documented.

## 2022-04-21 NOTE — Op Note (Signed)
FACIAL PLASTIC SURGERY OPERATIVE NOTE  [name] Date/Time of Admission: 04/21/2022  9:40 AM  CSN: [id number] Attending Provider: [name], MD Room/Bed: MCSP/NONE DOB: May 13, 1960 Age: 62 y.o.   Pre-Op Diagnosis: Acquired involutional ptosis of eyelid; Dermatochalasis of both upper eyelids  Post-Op Diagnosis: Acquired involutional ptosis of eyelid; Dermatochalasis of both upper eyelids  Procedure: Procedure(s): BILATERAL UPPER EYELID BLEPHAROPLASTY - CPT [id number]  Anesthesia: Monitor Anesthesia Care  Surgeon(s): Pamala Hurry, MD  Staff: Circulator: Anson Crofts, RN Scrub Person: Buddy Duty A  Implants: * No implants in log *  Specimens: * No specimens in log *  Complications: none  EBL: Minimal   IVF: Per anesthesia  Condition: stable  Operative Findings:  Bilateral upper eyelid dermatochalasis   Indications for Procedure Dustin Lozano is a 62 year old male with a history of T2DM, bilateral below the knee amputations, and superior visual field disturbance due to upper eyelid dermatochalasis and ptosis. He has requested definitive surgical management of upper eyelid dermatochalasis.  Informed consent obtained. R/B/A discussed including risks of of pain, bleeding, retrobulbar hematoma, infection, corneal abrasion, scarring, numbness, lagophthalmos, dry eye, poor cosmesis, need for further procedures, risks of anesthesia.    Description of Operation:  The patient was identified in the pre-operative area and consent confirmed in the chart. He was brought to the OR by anesthesia and MAC anesthesia induced. A pre-operative time out was performed.  The bed was turned 90 degrees from anesthesia.  The eyes were anesthetized with .5% tetracaine eye gtts. The supra-tarsal creases were marked and measured at 79m. 138mof upper eyelid skin was measured below the brow-upper eyelid skin junction to preserve 2054mf upper eyelid. A pinch test  was performed with brown forceps and the bilateral upper eyelid blepharoplasty incisions were designed from the medial canthus to several mm lateral to the lateral canthus, slightly larger on the left by 2-3mm51me to worse dermatochalasis. The upper eyelids were anesthetized with 1% lidocaine 1:100K epi and sodium bicarbonate. The patient was prepped and draped in standard sterile fashion.   Starting on the left eye, a 15 blade was used to excise the skin only flap from the prior upper blepharoplasty markings. [place]otts scissors were used to harvest the excessive skin the bovie used for cautery. Cold sterile compresses were applied.  The identical procedure was performed on the left side.  The eyelids were closed with running 5-0 plain gut. Two simple interrupted 6--0 nylons were placed centrally and laterally for extra strength. There was ease of eye closure at the end of the procedure. Abx ointment was placed on the incision.   The patient was turned back to the anesthetist who brought him to the recovery in stable condition.   Electronically signed by:  StevJenetta Downer  Staff Physician Facial Plastic & Reconstructive Surgery Otolaryngology - Head and Neck Surgery [facility]

## 2022-04-22 ENCOUNTER — Encounter (HOSPITAL_BASED_OUTPATIENT_CLINIC_OR_DEPARTMENT_OTHER): Payer: Self-pay | Admitting: Otolaryngology

## 2022-05-04 ENCOUNTER — Other Ambulatory Visit (HOSPITAL_BASED_OUTPATIENT_CLINIC_OR_DEPARTMENT_OTHER): Payer: Self-pay

## 2022-05-04 MED ORDER — TRULICITY 1.5 MG/0.5ML ~~LOC~~ SOAJ
SUBCUTANEOUS | 1 refills | Status: DC
  Filled 2022-05-04: qty 2, 28d supply, fill #0
  Filled 2022-06-02: qty 2, 28d supply, fill #1
  Filled 2022-06-29: qty 2, 28d supply, fill #2
  Filled 2022-07-27: qty 2, 28d supply, fill #3
  Filled 2022-08-21 (×2): qty 2, 28d supply, fill #4

## 2022-05-21 ENCOUNTER — Telehealth: Payer: Self-pay | Admitting: Internal Medicine

## 2022-05-21 NOTE — Telephone Encounter (Signed)
Good Afternoon Dr Rhea Belton,  I have received a call from this patient wishing to transfer his care back to you and have a colonoscopy done. Patient had a consult with [facility] back in January but says he would be more comfortable continuing care with you. I informed patient he is not due for a colonoscopy until 06/2028 but he says his PCP is recommending one now. Records are available to view in Epic via Care Everywhere, please review them at your earliest convenience and advise on scheduling.   Thank You

## 2022-05-24 NOTE — Telephone Encounter (Signed)
Patient had a GI consult with [facility] likely because his PCP is [facility] This was for normocytic anemia with borderline iron studies Low iron anemia would potentially change the decision about endoscopic workup Okay for an appointment with me or APP Thank you

## 2022-05-25 ENCOUNTER — Encounter: Payer: Self-pay | Admitting: Internal Medicine

## 2022-05-25 NOTE — Telephone Encounter (Signed)
Pt scheduled 7/25 at 9:50

## 2022-06-02 ENCOUNTER — Other Ambulatory Visit (HOSPITAL_BASED_OUTPATIENT_CLINIC_OR_DEPARTMENT_OTHER): Payer: Self-pay

## 2022-06-16 ENCOUNTER — Other Ambulatory Visit (HOSPITAL_BASED_OUTPATIENT_CLINIC_OR_DEPARTMENT_OTHER): Payer: Self-pay

## 2022-06-18 ENCOUNTER — Other Ambulatory Visit (HOSPITAL_BASED_OUTPATIENT_CLINIC_OR_DEPARTMENT_OTHER): Payer: Self-pay

## 2022-06-18 MED ORDER — METFORMIN HCL ER 500 MG PO TB24
1000.0000 mg | ORAL_TABLET | Freq: Two times a day (BID) | ORAL | 0 refills | Status: DC
  Filled 2022-06-18 – 2022-08-21 (×3): qty 360, 90d supply, fill #0

## 2022-06-18 MED ORDER — GABAPENTIN 800 MG PO TABS
800.0000 mg | ORAL_TABLET | Freq: Two times a day (BID) | ORAL | 0 refills | Status: DC
  Filled 2022-06-18: qty 180, 90d supply, fill #0

## 2022-06-18 MED ORDER — DAPAGLIFLOZIN PROPANEDIOL 5 MG PO TABS
5.0000 mg | ORAL_TABLET | Freq: Every day | ORAL | 0 refills | Status: DC
  Filled 2022-06-18 – 2022-08-25 (×4): qty 90, 90d supply, fill #0
  Filled ????-??-??: fill #0

## 2022-06-18 MED ORDER — AMLODIPINE BESYLATE 10 MG PO TABS
10.0000 mg | ORAL_TABLET | Freq: Every day | ORAL | 0 refills | Status: DC
  Filled 2022-06-18 – 2022-10-22 (×3): qty 90, 90d supply, fill #0

## 2022-06-18 MED ORDER — ATORVASTATIN CALCIUM 20 MG PO TABS
20.0000 mg | ORAL_TABLET | Freq: Every day | ORAL | 2 refills | Status: DC
  Filled 2022-06-18 – 2022-08-04 (×2): qty 90, 90d supply, fill #0
  Filled 2022-11-10: qty 90, 90d supply, fill #1
  Filled 2023-02-17: qty 90, 90d supply, fill #2

## 2022-06-18 MED ORDER — OMEPRAZOLE 40 MG PO CPDR
40.0000 mg | DELAYED_RELEASE_CAPSULE | Freq: Every day | ORAL | 2 refills | Status: DC
  Filled 2022-06-18 – 2022-08-21 (×3): qty 90, 90d supply, fill #0
  Filled 2022-11-27: qty 90, 90d supply, fill #1
  Filled 2023-02-17: qty 90, 90d supply, fill #2

## 2022-06-18 MED ORDER — CARVEDILOL 12.5 MG PO TABS
12.5000 mg | ORAL_TABLET | Freq: Two times a day (BID) | ORAL | 0 refills | Status: AC
  Filled 2022-06-18 – 2022-10-02 (×6): qty 180, 90d supply, fill #0
  Filled ????-??-?? (×2): fill #0

## 2022-06-18 MED ORDER — TAMSULOSIN HCL 0.4 MG PO CAPS
0.4000 mg | ORAL_CAPSULE | Freq: Every day | ORAL | 0 refills | Status: DC
  Filled 2022-06-18 – 2022-08-21 (×3): qty 90, 90d supply, fill #0

## 2022-06-18 MED ORDER — LISINOPRIL 40 MG PO TABS
40.0000 mg | ORAL_TABLET | Freq: Every day | ORAL | 0 refills | Status: DC
  Filled 2022-06-18 – 2022-10-02 (×5): qty 90, 90d supply, fill #0
  Filled ????-??-??: fill #0

## 2022-06-22 ENCOUNTER — Other Ambulatory Visit (HOSPITAL_BASED_OUTPATIENT_CLINIC_OR_DEPARTMENT_OTHER): Payer: Self-pay

## 2022-06-22 MED ORDER — ICOSAPENT ETHYL 1 G PO CAPS
2.0000 g | ORAL_CAPSULE | Freq: Two times a day (BID) | ORAL | 3 refills | Status: DC
  Filled 2022-06-22: qty 360, 90d supply, fill #0
  Filled 2022-09-08: qty 360, 90d supply, fill #1
  Filled 2022-12-10 – 2022-12-11 (×2): qty 360, 90d supply, fill #2
  Filled 2023-03-08: qty 360, 90d supply, fill #3

## 2022-07-27 ENCOUNTER — Other Ambulatory Visit (HOSPITAL_BASED_OUTPATIENT_CLINIC_OR_DEPARTMENT_OTHER): Payer: Self-pay

## 2022-07-29 ENCOUNTER — Other Ambulatory Visit (HOSPITAL_BASED_OUTPATIENT_CLINIC_OR_DEPARTMENT_OTHER): Payer: Self-pay

## 2022-07-29 MED ORDER — AMLODIPINE BESYLATE 10 MG PO TABS
10.0000 mg | ORAL_TABLET | Freq: Every day | ORAL | 0 refills | Status: DC
  Filled 2022-07-29: qty 90, 90d supply, fill #0

## 2022-08-03 ENCOUNTER — Other Ambulatory Visit (HOSPITAL_BASED_OUTPATIENT_CLINIC_OR_DEPARTMENT_OTHER): Payer: Self-pay

## 2022-08-04 ENCOUNTER — Other Ambulatory Visit (HOSPITAL_BASED_OUTPATIENT_CLINIC_OR_DEPARTMENT_OTHER): Payer: Self-pay

## 2022-08-05 ENCOUNTER — Other Ambulatory Visit (HOSPITAL_BASED_OUTPATIENT_CLINIC_OR_DEPARTMENT_OTHER): Payer: Self-pay

## 2022-08-06 ENCOUNTER — Other Ambulatory Visit (HOSPITAL_BASED_OUTPATIENT_CLINIC_OR_DEPARTMENT_OTHER): Payer: Self-pay

## 2022-08-21 ENCOUNTER — Other Ambulatory Visit (HOSPITAL_BASED_OUTPATIENT_CLINIC_OR_DEPARTMENT_OTHER): Payer: Self-pay

## 2022-08-25 ENCOUNTER — Encounter (HOSPITAL_BASED_OUTPATIENT_CLINIC_OR_DEPARTMENT_OTHER): Payer: Self-pay

## 2022-08-25 ENCOUNTER — Other Ambulatory Visit: Payer: Self-pay

## 2022-08-25 ENCOUNTER — Other Ambulatory Visit (HOSPITAL_BASED_OUTPATIENT_CLINIC_OR_DEPARTMENT_OTHER): Payer: Self-pay

## 2022-08-31 ENCOUNTER — Other Ambulatory Visit (HOSPITAL_BASED_OUTPATIENT_CLINIC_OR_DEPARTMENT_OTHER): Payer: Self-pay

## 2022-08-31 ENCOUNTER — Other Ambulatory Visit: Payer: Self-pay

## 2022-09-02 ENCOUNTER — Ambulatory Visit: Payer: Medicaid Other | Admitting: Internal Medicine

## 2022-09-08 ENCOUNTER — Other Ambulatory Visit (HOSPITAL_BASED_OUTPATIENT_CLINIC_OR_DEPARTMENT_OTHER): Payer: Self-pay

## 2022-09-09 ENCOUNTER — Other Ambulatory Visit (HOSPITAL_BASED_OUTPATIENT_CLINIC_OR_DEPARTMENT_OTHER): Payer: Self-pay

## 2022-09-09 MED ORDER — FUROSEMIDE 20 MG PO TABS
20.0000 mg | ORAL_TABLET | Freq: Every day | ORAL | 0 refills | Status: AC
  Filled 2022-09-09: qty 3, 3d supply, fill #0

## 2022-09-09 MED ORDER — TRULICITY 3 MG/0.5ML ~~LOC~~ SOAJ
3.0000 mg | SUBCUTANEOUS | 3 refills | Status: DC
  Filled 2022-09-09: qty 2, 28d supply, fill #0
  Filled 2022-10-02: qty 2, 28d supply, fill #1
  Filled 2022-11-09: qty 2, 28d supply, fill #2
  Filled 2022-12-03: qty 2, 28d supply, fill #3

## 2022-09-10 ENCOUNTER — Other Ambulatory Visit (HOSPITAL_BASED_OUTPATIENT_CLINIC_OR_DEPARTMENT_OTHER): Payer: Self-pay

## 2022-09-13 ENCOUNTER — Other Ambulatory Visit (HOSPITAL_BASED_OUTPATIENT_CLINIC_OR_DEPARTMENT_OTHER): Payer: Self-pay

## 2022-09-14 ENCOUNTER — Other Ambulatory Visit (HOSPITAL_BASED_OUTPATIENT_CLINIC_OR_DEPARTMENT_OTHER): Payer: Self-pay

## 2022-09-15 ENCOUNTER — Other Ambulatory Visit (HOSPITAL_BASED_OUTPATIENT_CLINIC_OR_DEPARTMENT_OTHER): Payer: Self-pay

## 2022-09-16 ENCOUNTER — Other Ambulatory Visit (HOSPITAL_BASED_OUTPATIENT_CLINIC_OR_DEPARTMENT_OTHER): Payer: Self-pay

## 2022-09-16 MED ORDER — GABAPENTIN 800 MG PO TABS
800.0000 mg | ORAL_TABLET | Freq: Two times a day (BID) | ORAL | 1 refills | Status: DC
  Filled 2022-09-16: qty 180, 90d supply, fill #0
  Filled 2022-12-10 – 2022-12-11 (×2): qty 180, 90d supply, fill #1

## 2022-10-02 ENCOUNTER — Other Ambulatory Visit (HOSPITAL_BASED_OUTPATIENT_CLINIC_OR_DEPARTMENT_OTHER): Payer: Self-pay

## 2022-10-03 ENCOUNTER — Other Ambulatory Visit (HOSPITAL_BASED_OUTPATIENT_CLINIC_OR_DEPARTMENT_OTHER): Payer: Self-pay

## 2022-10-04 ENCOUNTER — Other Ambulatory Visit (HOSPITAL_BASED_OUTPATIENT_CLINIC_OR_DEPARTMENT_OTHER): Payer: Self-pay

## 2022-10-19 LAB — COLOGUARD: COLOGUARD: NEGATIVE

## 2022-10-22 ENCOUNTER — Other Ambulatory Visit: Payer: Self-pay

## 2022-10-22 ENCOUNTER — Other Ambulatory Visit (HOSPITAL_BASED_OUTPATIENT_CLINIC_OR_DEPARTMENT_OTHER): Payer: Self-pay

## 2022-10-22 MED ORDER — INFLUENZA VIRUS VACC SPLIT PF (FLUZONE) 0.5 ML IM SUSY
0.5000 mL | PREFILLED_SYRINGE | Freq: Once | INTRAMUSCULAR | 0 refills | Status: AC
  Filled 2022-10-22: qty 0.5, 1d supply, fill #0

## 2022-10-22 MED ORDER — COVID-19 MRNA VAC-TRIS(PFIZER) 30 MCG/0.3ML IM SUSY
0.3000 mL | PREFILLED_SYRINGE | Freq: Once | INTRAMUSCULAR | 0 refills | Status: AC
  Filled 2022-10-22: qty 0.3, 1d supply, fill #0

## 2022-11-12 ENCOUNTER — Other Ambulatory Visit (HOSPITAL_BASED_OUTPATIENT_CLINIC_OR_DEPARTMENT_OTHER): Payer: Self-pay

## 2022-11-20 ENCOUNTER — Other Ambulatory Visit (HOSPITAL_BASED_OUTPATIENT_CLINIC_OR_DEPARTMENT_OTHER): Payer: Self-pay

## 2022-11-23 ENCOUNTER — Other Ambulatory Visit (HOSPITAL_BASED_OUTPATIENT_CLINIC_OR_DEPARTMENT_OTHER): Payer: Self-pay

## 2022-11-23 MED ORDER — METFORMIN HCL ER 500 MG PO TB24
1000.0000 mg | ORAL_TABLET | Freq: Two times a day (BID) | ORAL | 0 refills | Status: DC
  Filled 2022-11-23: qty 360, 90d supply, fill #0

## 2022-11-27 ENCOUNTER — Other Ambulatory Visit (HOSPITAL_BASED_OUTPATIENT_CLINIC_OR_DEPARTMENT_OTHER): Payer: Self-pay

## 2022-11-29 ENCOUNTER — Other Ambulatory Visit: Payer: Self-pay

## 2022-11-29 ENCOUNTER — Other Ambulatory Visit (HOSPITAL_BASED_OUTPATIENT_CLINIC_OR_DEPARTMENT_OTHER): Payer: Self-pay

## 2022-11-29 MED ORDER — ZOLPIDEM TARTRATE 10 MG PO TABS
10.0000 mg | ORAL_TABLET | Freq: Every evening | ORAL | 0 refills | Status: DC
  Filled 2022-11-29: qty 30, 30d supply, fill #0

## 2022-12-03 ENCOUNTER — Other Ambulatory Visit (HOSPITAL_BASED_OUTPATIENT_CLINIC_OR_DEPARTMENT_OTHER): Payer: Self-pay

## 2022-12-08 ENCOUNTER — Other Ambulatory Visit (HOSPITAL_BASED_OUTPATIENT_CLINIC_OR_DEPARTMENT_OTHER): Payer: Self-pay

## 2022-12-10 ENCOUNTER — Other Ambulatory Visit (HOSPITAL_BASED_OUTPATIENT_CLINIC_OR_DEPARTMENT_OTHER): Payer: Self-pay

## 2022-12-11 ENCOUNTER — Other Ambulatory Visit (HOSPITAL_BASED_OUTPATIENT_CLINIC_OR_DEPARTMENT_OTHER): Payer: Self-pay

## 2022-12-14 ENCOUNTER — Other Ambulatory Visit (HOSPITAL_BASED_OUTPATIENT_CLINIC_OR_DEPARTMENT_OTHER): Payer: Self-pay

## 2022-12-14 MED ORDER — TAMSULOSIN HCL 0.4 MG PO CAPS
0.4000 mg | ORAL_CAPSULE | Freq: Every day | ORAL | 0 refills | Status: DC
  Filled 2022-12-14: qty 90, 90d supply, fill #0

## 2022-12-20 ENCOUNTER — Other Ambulatory Visit (HOSPITAL_BASED_OUTPATIENT_CLINIC_OR_DEPARTMENT_OTHER): Payer: Self-pay

## 2022-12-29 ENCOUNTER — Other Ambulatory Visit (HOSPITAL_BASED_OUTPATIENT_CLINIC_OR_DEPARTMENT_OTHER): Payer: Self-pay

## 2023-01-03 ENCOUNTER — Other Ambulatory Visit (HOSPITAL_BASED_OUTPATIENT_CLINIC_OR_DEPARTMENT_OTHER): Payer: Self-pay

## 2023-01-03 MED ORDER — DAPAGLIFLOZIN PROPANEDIOL 5 MG PO TABS
5.0000 mg | ORAL_TABLET | Freq: Every day | ORAL | 0 refills | Status: DC
  Filled 2023-01-03: qty 90, 90d supply, fill #0

## 2023-01-13 ENCOUNTER — Other Ambulatory Visit (HOSPITAL_BASED_OUTPATIENT_CLINIC_OR_DEPARTMENT_OTHER): Payer: Self-pay

## 2023-01-13 MED ORDER — TRULICITY 3 MG/0.5ML ~~LOC~~ SOAJ
3.0000 mg | SUBCUTANEOUS | 3 refills | Status: DC
  Filled 2023-01-13: qty 2, 28d supply, fill #0
  Filled 2023-02-11 – 2023-02-12 (×2): qty 2, 28d supply, fill #1
  Filled 2023-03-08: qty 2, 28d supply, fill #2
  Filled 2023-04-05: qty 2, 28d supply, fill #3

## 2023-01-20 ENCOUNTER — Encounter (HOSPITAL_BASED_OUTPATIENT_CLINIC_OR_DEPARTMENT_OTHER): Payer: Self-pay

## 2023-01-20 ENCOUNTER — Other Ambulatory Visit (HOSPITAL_BASED_OUTPATIENT_CLINIC_OR_DEPARTMENT_OTHER): Payer: Self-pay

## 2023-01-25 ENCOUNTER — Other Ambulatory Visit (HOSPITAL_BASED_OUTPATIENT_CLINIC_OR_DEPARTMENT_OTHER): Payer: Self-pay

## 2023-01-25 MED ORDER — AMLODIPINE BESYLATE 10 MG PO TABS
10.0000 mg | ORAL_TABLET | Freq: Every day | ORAL | 0 refills | Status: DC
  Filled 2023-01-25: qty 90, 90d supply, fill #0

## 2023-02-01 ENCOUNTER — Other Ambulatory Visit (HOSPITAL_BASED_OUTPATIENT_CLINIC_OR_DEPARTMENT_OTHER): Payer: Self-pay

## 2023-02-11 ENCOUNTER — Other Ambulatory Visit (HOSPITAL_BASED_OUTPATIENT_CLINIC_OR_DEPARTMENT_OTHER): Payer: Self-pay

## 2023-02-12 ENCOUNTER — Other Ambulatory Visit (HOSPITAL_BASED_OUTPATIENT_CLINIC_OR_DEPARTMENT_OTHER): Payer: Self-pay

## 2023-02-14 ENCOUNTER — Other Ambulatory Visit (HOSPITAL_BASED_OUTPATIENT_CLINIC_OR_DEPARTMENT_OTHER): Payer: Self-pay

## 2023-02-15 ENCOUNTER — Other Ambulatory Visit (HOSPITAL_BASED_OUTPATIENT_CLINIC_OR_DEPARTMENT_OTHER): Payer: Self-pay

## 2023-02-15 MED ORDER — METFORMIN HCL ER 500 MG PO TB24
1000.0000 mg | ORAL_TABLET | Freq: Two times a day (BID) | ORAL | 0 refills | Status: DC
  Filled 2023-02-15: qty 360, 90d supply, fill #0

## 2023-02-16 ENCOUNTER — Other Ambulatory Visit (HOSPITAL_BASED_OUTPATIENT_CLINIC_OR_DEPARTMENT_OTHER): Payer: Self-pay

## 2023-02-16 MED ORDER — LISINOPRIL 40 MG PO TABS
40.0000 mg | ORAL_TABLET | Freq: Every day | ORAL | 0 refills | Status: DC
  Filled 2023-02-16: qty 90, 90d supply, fill #0

## 2023-02-17 ENCOUNTER — Other Ambulatory Visit (HOSPITAL_BASED_OUTPATIENT_CLINIC_OR_DEPARTMENT_OTHER): Payer: Self-pay

## 2023-02-17 ENCOUNTER — Other Ambulatory Visit: Payer: Self-pay

## 2023-02-18 ENCOUNTER — Other Ambulatory Visit (HOSPITAL_BASED_OUTPATIENT_CLINIC_OR_DEPARTMENT_OTHER): Payer: Self-pay

## 2023-02-18 MED ORDER — ZOLPIDEM TARTRATE 10 MG PO TABS
10.0000 mg | ORAL_TABLET | Freq: Every evening | ORAL | 0 refills | Status: DC | PRN
  Filled 2023-02-18: qty 30, 30d supply, fill #0

## 2023-02-23 ENCOUNTER — Other Ambulatory Visit (HOSPITAL_BASED_OUTPATIENT_CLINIC_OR_DEPARTMENT_OTHER): Payer: Self-pay

## 2023-02-26 ENCOUNTER — Other Ambulatory Visit (HOSPITAL_BASED_OUTPATIENT_CLINIC_OR_DEPARTMENT_OTHER): Payer: Self-pay

## 2023-03-01 ENCOUNTER — Other Ambulatory Visit (HOSPITAL_BASED_OUTPATIENT_CLINIC_OR_DEPARTMENT_OTHER): Payer: Self-pay

## 2023-03-01 MED ORDER — CARVEDILOL 12.5 MG PO TABS
12.5000 mg | ORAL_TABLET | Freq: Two times a day (BID) | ORAL | 0 refills | Status: DC
  Filled 2023-03-01: qty 180, 90d supply, fill #0

## 2023-03-03 ENCOUNTER — Other Ambulatory Visit (HOSPITAL_BASED_OUTPATIENT_CLINIC_OR_DEPARTMENT_OTHER): Payer: Self-pay

## 2023-03-08 ENCOUNTER — Other Ambulatory Visit (HOSPITAL_BASED_OUTPATIENT_CLINIC_OR_DEPARTMENT_OTHER): Payer: Self-pay

## 2023-03-08 MED ORDER — GABAPENTIN 800 MG PO TABS
800.0000 mg | ORAL_TABLET | Freq: Two times a day (BID) | ORAL | 0 refills | Status: DC
  Filled 2023-03-08: qty 60, 30d supply, fill #0

## 2023-03-08 MED ORDER — TAMSULOSIN HCL 0.4 MG PO CAPS
0.4000 mg | ORAL_CAPSULE | Freq: Every day | ORAL | 0 refills | Status: DC
  Filled 2023-03-08: qty 90, 90d supply, fill #0

## 2023-03-09 ENCOUNTER — Other Ambulatory Visit (HOSPITAL_BASED_OUTPATIENT_CLINIC_OR_DEPARTMENT_OTHER): Payer: Self-pay

## 2023-03-29 ENCOUNTER — Other Ambulatory Visit (HOSPITAL_BASED_OUTPATIENT_CLINIC_OR_DEPARTMENT_OTHER): Payer: Self-pay

## 2023-03-30 ENCOUNTER — Other Ambulatory Visit (HOSPITAL_BASED_OUTPATIENT_CLINIC_OR_DEPARTMENT_OTHER): Payer: Self-pay

## 2023-03-30 MED ORDER — DAPAGLIFLOZIN PROPANEDIOL 5 MG PO TABS
5.0000 mg | ORAL_TABLET | Freq: Every day | ORAL | 0 refills | Status: DC
  Filled 2023-03-30: qty 90, 90d supply, fill #0

## 2023-04-05 ENCOUNTER — Other Ambulatory Visit (HOSPITAL_BASED_OUTPATIENT_CLINIC_OR_DEPARTMENT_OTHER): Payer: Self-pay

## 2023-04-06 ENCOUNTER — Other Ambulatory Visit: Payer: Self-pay

## 2023-04-06 ENCOUNTER — Other Ambulatory Visit (HOSPITAL_BASED_OUTPATIENT_CLINIC_OR_DEPARTMENT_OTHER): Payer: Self-pay

## 2023-04-06 MED ORDER — GABAPENTIN 800 MG PO TABS
800.0000 mg | ORAL_TABLET | Freq: Two times a day (BID) | ORAL | 1 refills | Status: DC
  Filled 2023-04-06: qty 60, 30d supply, fill #0
  Filled 2023-05-03: qty 60, 30d supply, fill #1

## 2023-04-21 ENCOUNTER — Other Ambulatory Visit (HOSPITAL_BASED_OUTPATIENT_CLINIC_OR_DEPARTMENT_OTHER): Payer: Self-pay

## 2023-04-22 ENCOUNTER — Other Ambulatory Visit (HOSPITAL_BASED_OUTPATIENT_CLINIC_OR_DEPARTMENT_OTHER): Payer: Self-pay

## 2023-04-22 MED ORDER — AMLODIPINE BESYLATE 10 MG PO TABS
10.0000 mg | ORAL_TABLET | Freq: Every day | ORAL | 0 refills | Status: DC
  Filled 2023-04-22: qty 90, 90d supply, fill #0

## 2023-04-23 ENCOUNTER — Other Ambulatory Visit (HOSPITAL_BASED_OUTPATIENT_CLINIC_OR_DEPARTMENT_OTHER): Payer: Self-pay

## 2023-04-25 ENCOUNTER — Other Ambulatory Visit (HOSPITAL_BASED_OUTPATIENT_CLINIC_OR_DEPARTMENT_OTHER): Payer: Self-pay

## 2023-04-25 MED ORDER — CARVEDILOL 12.5 MG PO TABS
ORAL_TABLET | ORAL | 0 refills | Status: DC
Start: 2023-04-25 — End: 2023-08-18
  Filled 2023-04-25 – 2023-05-12 (×2): qty 180, 90d supply, fill #0

## 2023-05-03 ENCOUNTER — Other Ambulatory Visit (HOSPITAL_BASED_OUTPATIENT_CLINIC_OR_DEPARTMENT_OTHER): Payer: Self-pay

## 2023-05-03 MED ORDER — ZOLPIDEM TARTRATE 10 MG PO TABS
10.0000 mg | ORAL_TABLET | Freq: Every evening | ORAL | 0 refills | Status: DC | PRN
  Filled 2023-05-03: qty 30, 30d supply, fill #0

## 2023-05-04 ENCOUNTER — Other Ambulatory Visit (HOSPITAL_BASED_OUTPATIENT_CLINIC_OR_DEPARTMENT_OTHER): Payer: Self-pay

## 2023-05-04 MED ORDER — TRULICITY 3 MG/0.5ML ~~LOC~~ SOAJ
3.0000 mg | SUBCUTANEOUS | 3 refills | Status: DC
Start: 2023-05-04 — End: 2023-08-18
  Filled 2023-05-04: qty 2, 28d supply, fill #0
  Filled 2023-05-29: qty 2, 28d supply, fill #1
  Filled 2023-06-25: qty 2, 28d supply, fill #2
  Filled 2023-07-22 – 2023-07-23 (×2): qty 2, 28d supply, fill #3

## 2023-05-05 ENCOUNTER — Other Ambulatory Visit (HOSPITAL_BASED_OUTPATIENT_CLINIC_OR_DEPARTMENT_OTHER): Payer: Self-pay

## 2023-05-12 ENCOUNTER — Other Ambulatory Visit: Payer: Self-pay

## 2023-05-12 ENCOUNTER — Other Ambulatory Visit (HOSPITAL_BASED_OUTPATIENT_CLINIC_OR_DEPARTMENT_OTHER): Payer: Self-pay

## 2023-05-12 MED ORDER — LISINOPRIL 40 MG PO TABS
40.0000 mg | ORAL_TABLET | Freq: Every day | ORAL | 0 refills | Status: DC
  Filled 2023-05-12: qty 90, 90d supply, fill #0

## 2023-05-17 ENCOUNTER — Other Ambulatory Visit (HOSPITAL_BASED_OUTPATIENT_CLINIC_OR_DEPARTMENT_OTHER): Payer: Self-pay

## 2023-05-18 ENCOUNTER — Other Ambulatory Visit: Payer: Self-pay

## 2023-05-18 ENCOUNTER — Other Ambulatory Visit (HOSPITAL_BASED_OUTPATIENT_CLINIC_OR_DEPARTMENT_OTHER): Payer: Self-pay

## 2023-05-18 MED ORDER — GABAPENTIN 800 MG PO TABS
800.0000 mg | ORAL_TABLET | Freq: Two times a day (BID) | ORAL | 1 refills | Status: DC
  Filled 2023-05-18 – 2023-05-29 (×2): qty 60, 30d supply, fill #0
  Filled 2023-06-25: qty 60, 30d supply, fill #1

## 2023-05-18 MED ORDER — METFORMIN HCL ER 500 MG PO TB24
1000.0000 mg | ORAL_TABLET | Freq: Two times a day (BID) | ORAL | 0 refills | Status: DC
  Filled 2023-05-18: qty 360, 90d supply, fill #0

## 2023-05-19 ENCOUNTER — Other Ambulatory Visit (HOSPITAL_BASED_OUTPATIENT_CLINIC_OR_DEPARTMENT_OTHER): Payer: Self-pay

## 2023-05-23 ENCOUNTER — Encounter (HOSPITAL_BASED_OUTPATIENT_CLINIC_OR_DEPARTMENT_OTHER): Payer: Self-pay

## 2023-05-23 ENCOUNTER — Other Ambulatory Visit (HOSPITAL_BASED_OUTPATIENT_CLINIC_OR_DEPARTMENT_OTHER): Payer: Self-pay

## 2023-05-23 MED ORDER — OMEPRAZOLE 40 MG PO CPDR
40.0000 mg | DELAYED_RELEASE_CAPSULE | Freq: Every day | ORAL | 2 refills | Status: DC
  Filled 2023-05-23: qty 90, 90d supply, fill #0
  Filled 2023-08-18: qty 90, 90d supply, fill #1
  Filled 2023-11-21: qty 90, 90d supply, fill #2

## 2023-05-23 MED ORDER — ATORVASTATIN CALCIUM 20 MG PO TABS
20.0000 mg | ORAL_TABLET | Freq: Every day | ORAL | 2 refills | Status: DC
Start: 2023-05-23 — End: 2023-12-21
  Filled 2023-05-23: qty 90, 90d supply, fill #0
  Filled 2023-08-18: qty 90, 90d supply, fill #1
  Filled 2023-11-21: qty 90, 90d supply, fill #2

## 2023-05-29 ENCOUNTER — Other Ambulatory Visit (HOSPITAL_BASED_OUTPATIENT_CLINIC_OR_DEPARTMENT_OTHER): Payer: Self-pay

## 2023-05-30 ENCOUNTER — Other Ambulatory Visit (HOSPITAL_BASED_OUTPATIENT_CLINIC_OR_DEPARTMENT_OTHER): Payer: Self-pay

## 2023-05-30 ENCOUNTER — Other Ambulatory Visit: Payer: Self-pay

## 2023-05-30 MED ORDER — TAMSULOSIN HCL 0.4 MG PO CAPS
0.4000 mg | ORAL_CAPSULE | Freq: Every day | ORAL | 0 refills | Status: DC
Start: 2023-05-30 — End: 2023-09-01
  Filled 2023-05-30: qty 90, 90d supply, fill #0

## 2023-05-31 ENCOUNTER — Other Ambulatory Visit (HOSPITAL_BASED_OUTPATIENT_CLINIC_OR_DEPARTMENT_OTHER): Payer: Self-pay

## 2023-06-01 ENCOUNTER — Other Ambulatory Visit (HOSPITAL_BASED_OUTPATIENT_CLINIC_OR_DEPARTMENT_OTHER): Payer: Self-pay

## 2023-06-01 ENCOUNTER — Encounter (HOSPITAL_BASED_OUTPATIENT_CLINIC_OR_DEPARTMENT_OTHER): Payer: Self-pay

## 2023-06-01 MED ORDER — ICOSAPENT ETHYL 1 G PO CAPS
2.0000 g | ORAL_CAPSULE | Freq: Two times a day (BID) | ORAL | 1 refills | Status: DC
  Filled 2023-06-01: qty 360, 90d supply, fill #0
  Filled 2023-09-01: qty 360, 90d supply, fill #1

## 2023-06-08 ENCOUNTER — Other Ambulatory Visit (HOSPITAL_BASED_OUTPATIENT_CLINIC_OR_DEPARTMENT_OTHER): Payer: Self-pay

## 2023-06-08 MED ORDER — BLOOD GLUCOSE MONITOR SYSTEM W/DEVICE KIT
PACK | 1 refills | Status: DC
  Filled 2023-06-08: qty 1, 90d supply, fill #0
  Filled 2023-09-01: qty 1, 90d supply, fill #1

## 2023-06-25 ENCOUNTER — Other Ambulatory Visit (HOSPITAL_BASED_OUTPATIENT_CLINIC_OR_DEPARTMENT_OTHER): Payer: Self-pay

## 2023-06-27 ENCOUNTER — Other Ambulatory Visit: Payer: Self-pay

## 2023-06-30 ENCOUNTER — Other Ambulatory Visit (HOSPITAL_BASED_OUTPATIENT_CLINIC_OR_DEPARTMENT_OTHER): Payer: Self-pay

## 2023-07-01 ENCOUNTER — Other Ambulatory Visit (HOSPITAL_BASED_OUTPATIENT_CLINIC_OR_DEPARTMENT_OTHER): Payer: Self-pay

## 2023-07-01 ENCOUNTER — Encounter (HOSPITAL_BASED_OUTPATIENT_CLINIC_OR_DEPARTMENT_OTHER): Payer: Self-pay

## 2023-07-05 ENCOUNTER — Other Ambulatory Visit (HOSPITAL_COMMUNITY): Payer: Self-pay

## 2023-07-05 ENCOUNTER — Other Ambulatory Visit (HOSPITAL_BASED_OUTPATIENT_CLINIC_OR_DEPARTMENT_OTHER): Payer: Self-pay

## 2023-07-05 MED ORDER — DAPAGLIFLOZIN PROPANEDIOL 5 MG PO TABS
5.0000 mg | ORAL_TABLET | Freq: Every day | ORAL | 2 refills | Status: DC
  Filled 2023-07-05: qty 30, 30d supply, fill #0
  Filled 2023-07-22 – 2023-07-29 (×3): qty 30, 30d supply, fill #1
  Filled 2023-08-18 – 2023-08-29 (×2): qty 30, 30d supply, fill #2

## 2023-07-22 ENCOUNTER — Other Ambulatory Visit (HOSPITAL_BASED_OUTPATIENT_CLINIC_OR_DEPARTMENT_OTHER): Payer: Self-pay

## 2023-07-23 ENCOUNTER — Other Ambulatory Visit (HOSPITAL_BASED_OUTPATIENT_CLINIC_OR_DEPARTMENT_OTHER): Payer: Self-pay

## 2023-07-25 ENCOUNTER — Other Ambulatory Visit (HOSPITAL_BASED_OUTPATIENT_CLINIC_OR_DEPARTMENT_OTHER): Payer: Self-pay

## 2023-07-25 ENCOUNTER — Other Ambulatory Visit: Payer: Self-pay

## 2023-07-25 MED ORDER — ZOLPIDEM TARTRATE 10 MG PO TABS
10.0000 mg | ORAL_TABLET | Freq: Every evening | ORAL | 0 refills | Status: DC | PRN
  Filled 2023-07-25: qty 30, 30d supply, fill #0

## 2023-07-25 MED ORDER — AMLODIPINE BESYLATE 10 MG PO TABS
10.0000 mg | ORAL_TABLET | Freq: Every day | ORAL | 0 refills | Status: DC
  Filled 2023-07-25: qty 90, 90d supply, fill #0

## 2023-07-25 MED ORDER — LISINOPRIL 40 MG PO TABS
40.0000 mg | ORAL_TABLET | Freq: Every day | ORAL | 0 refills | Status: DC
  Filled 2023-07-25: qty 90, 90d supply, fill #0

## 2023-07-25 MED ORDER — GABAPENTIN 800 MG PO TABS
800.0000 mg | ORAL_TABLET | Freq: Two times a day (BID) | ORAL | 1 refills | Status: DC
  Filled 2023-07-26: qty 60, 30d supply, fill #0
  Filled 2023-08-25: qty 60, 30d supply, fill #1

## 2023-07-26 ENCOUNTER — Other Ambulatory Visit (HOSPITAL_BASED_OUTPATIENT_CLINIC_OR_DEPARTMENT_OTHER): Payer: Self-pay

## 2023-08-11 ENCOUNTER — Ambulatory Visit: Admitting: Orthopedic Surgery

## 2023-08-11 ENCOUNTER — Encounter: Payer: Self-pay | Admitting: Orthopedic Surgery

## 2023-08-11 DIAGNOSIS — Z89512 Acquired absence of left leg below knee: Secondary | ICD-10-CM

## 2023-08-11 DIAGNOSIS — Z89511 Acquired absence of right leg below knee: Secondary | ICD-10-CM

## 2023-08-11 NOTE — Progress Notes (Signed)
 Office Visit Note   Patient: Dustin Lozano           Date of Birth: February 08, 1961           MRN: [id number] Visit Date: 08/11/2023              Requested by: Katrinka Duwaine LABOR, FNP [address] [place],  [place] [address] PCP: Katrinka Duwaine LABOR, FNP  Chief Complaint  Patient presents with   Right Leg - Follow-up    Bilateral BKA   Left Leg - Follow-up      HPI: Patient is a 63 year old gentleman bilateral transtibial amputee.  Patient is subsiding into his socket and is currently wearing over 18 ply sock.  Assessment & Plan: Visit Diagnoses:  1. S/P bilateral below knee amputation (HCC)     Plan: Prescription provided for new socket liner and supplies.  Follow-Up Instructions: Return if symptoms worsen or fail to improve.   Ortho Exam  Patient is alert, oriented, no adenopathy, well-dressed, normal affect, normal respiratory effort. Examination patient has callus over the residual limb bilaterally from subsiding into the socket.  There is no cellulitis no open wounds.  Patient is an existing bilateral transtibial  amputee.  Patient's current comorbidities are not expected to impact the ability to function with the prescribed prosthesis. Patient verbally communicates a strong desire to use a prosthesis. Patient currently requires mobility aids to ambulate without a prosthesis.  Expects not to use mobility aids with a new prosthesis. Patient is expected to resume or reach their K Level within 6 months. Patient was active before the amputation and independent with stairs, uneven terrain, varying cadence, and a community ambulator.  Patient is a K3 level ambulator that spends a lot of time walking around on uneven terrain over obstacles, up and down stairs, and ambulates with a variable cadence.       Imaging: No results found. No images are attached to the encounter.  Labs: Lab Results  Component Value Date   HGBA1C 5.9 11/17/2016   HGBA1C 8.2  08/18/2016   HGBA1C 7.8 (H) 05/17/2016     Lab Results  Component Value Date   ALBUMIN 2.5 (L) 05/11/2017   ALBUMIN 4.1 05/17/2016   ALBUMIN 4.3 10/09/2015    Lab Results  Component Value Date   MG 1.9 08/15/2011   MG 2.0 08/14/2011   No results found for: VD25OH  No results found for: PREALBUMIN    Latest Ref Rng & Units 05/11/2017   12:07 PM 05/17/2016    8:25 AM 10/09/2015    9:15 AM  CBC EXTENDED  WBC 4.0 - 10.5 K/uL 22.5  8.0  9.0   RBC 4.22 - 5.81 MIL/uL 4.55  4.52  4.65   Hemoglobin 13.0 - 17.0 g/dL 87.3  86.6  86.1   HCT 39.0 - 52.0 % 37.2  38.7  40.5   Platelets 150 - 400 K/uL 305  292.0  324.0   NEUT# 1.7 - 7.7 K/uL 19.7  4.3  5.5   Lymph# 0.7 - 4.0 K/uL 0.7  2.1  2.2      There is no height or weight on file to calculate BMI.  Orders:  No orders of the defined types were placed in this encounter.  No orders of the defined types were placed in this encounter.    Procedures: No procedures performed  Clinical Data: No additional findings.  ROS:  All other systems negative, except as noted in the HPI. Review  of Systems  Objective: Vital Signs: There were no vitals taken for this visit.  Specialty Comments:  No specialty comments available.  PMFS History: Patient Active Problem List   Diagnosis Date Noted   S/P bilateral below knee amputation (HCC) 04/26/2018   Diabetic neuropathy (HCC) 10/22/2015   Chronic systolic heart failure (HCC) 08/24/2011   HTN (hypertension), malignant 08/14/2011   Diabetes mellitus type 2, noninsulin dependent (HCC) 08/14/2011   Past Medical History:  Diagnosis Date   Allergy    Arthritis    CHF (congestive heart failure) (HCC)    Systolic - LVEF 55-60  (06/2012)    Coronary artery disease    Nonobstructive disease per cath 08/2011   Diabetes mellitus    GERD (gastroesophageal reflux disease)    HTN (hypertension)    Hyperlipidemia    Neuromuscular disorder (HCC)    Nerve damage    Family History   Problem Relation Age of Onset   Diabetes Father    Heart failure Father    Cancer Mother        lymphoma   Diabetes Mother    Colon cancer Neg Hx    Esophageal cancer Neg Hx    Rectal cancer Neg Hx    Stomach cancer Neg Hx     Past Surgical History:  Procedure Laterality Date   APPENDECTOMY     BELOW KNEE LEG AMPUTATION Bilateral    BROW LIFT Bilateral 04/21/2022   Procedure: UPPER EYELID BLEPHAROPLASTY;  Surgeon: Luciano Standing, MD;  Location: [facility];  Service: ENT;  Laterality: Bilateral;   COLONOSCOPY     LEFT AND RIGHT HEART CATHETERIZATION WITH CORONARY ANGIOGRAM N/A 08/17/2011   Procedure: LEFT AND RIGHT HEART CATHETERIZATION WITH CORONARY ANGIOGRAM;  Surgeon: Ozell Fell, MD;  Location: [facility] CATH LAB;  Service: Cardiovascular;  Laterality: N/A;   POLYPECTOMY     Social History   Occupational History   Occupation: salesman  Tobacco Use   Smoking status: Former    Current packs/day: 0.00    Average packs/day: 0.7 packs/day for 15.0 years (10.5 ttl pk-yrs)    Types: Cigarettes    Start date: 02/09/1991    Quit date: 02/08/2006    Years since quitting: 17.5   Smokeless tobacco: Never  Substance and Sexual Activity   Alcohol use: Yes    Comment: rare   Drug use: No   Sexual activity: Not on file

## 2023-08-18 ENCOUNTER — Other Ambulatory Visit: Payer: Self-pay

## 2023-08-18 ENCOUNTER — Other Ambulatory Visit (HOSPITAL_BASED_OUTPATIENT_CLINIC_OR_DEPARTMENT_OTHER): Payer: Self-pay

## 2023-08-18 MED ORDER — TRULICITY 3 MG/0.5ML ~~LOC~~ SOAJ
3.0000 mg | SUBCUTANEOUS | 3 refills | Status: AC
  Filled 2023-08-18: qty 2, 28d supply, fill #0
  Filled 2024-01-31 – 2024-02-06 (×2): qty 2, 28d supply, fill #1
  Filled 2024-03-09: qty 2, 28d supply, fill #2

## 2023-08-18 MED ORDER — METFORMIN HCL ER 500 MG PO TB24
1000.0000 mg | ORAL_TABLET | Freq: Two times a day (BID) | ORAL | 0 refills | Status: DC
  Filled 2023-08-18: qty 360, 90d supply, fill #0

## 2023-08-18 MED ORDER — CARVEDILOL 12.5 MG PO TABS
12.5000 mg | ORAL_TABLET | Freq: Two times a day (BID) | ORAL | 0 refills | Status: DC
  Filled 2023-08-18: qty 180, 90d supply, fill #0

## 2023-09-01 ENCOUNTER — Other Ambulatory Visit (HOSPITAL_BASED_OUTPATIENT_CLINIC_OR_DEPARTMENT_OTHER): Payer: Self-pay

## 2023-09-01 MED ORDER — TAMSULOSIN HCL 0.4 MG PO CAPS
0.4000 mg | ORAL_CAPSULE | Freq: Every day | ORAL | 0 refills | Status: DC
  Filled 2023-09-01: qty 90, 90d supply, fill #0

## 2023-09-02 ENCOUNTER — Other Ambulatory Visit: Payer: Self-pay

## 2023-09-07 ENCOUNTER — Other Ambulatory Visit (HOSPITAL_BASED_OUTPATIENT_CLINIC_OR_DEPARTMENT_OTHER): Payer: Self-pay

## 2023-09-07 MED ORDER — DAPAGLIFLOZIN PROPANEDIOL 5 MG PO TABS
5.0000 mg | ORAL_TABLET | Freq: Every day | ORAL | 3 refills | Status: DC
  Filled 2023-09-07 – 2023-09-27 (×2): qty 90, 90d supply, fill #0

## 2023-09-07 MED ORDER — TRULICITY 3 MG/0.5ML ~~LOC~~ SOAJ
3.0000 mg | SUBCUTANEOUS | 3 refills | Status: DC
  Filled 2023-09-07 – 2023-09-08 (×3): qty 6, 84d supply, fill #0
  Filled 2023-12-05: qty 6, 84d supply, fill #1

## 2023-09-07 MED ORDER — GABAPENTIN 800 MG PO TABS
800.0000 mg | ORAL_TABLET | Freq: Two times a day (BID) | ORAL | 1 refills | Status: AC
  Filled 2023-09-07 – 2023-09-27 (×2): qty 180, 90d supply, fill #0
  Filled 2023-12-23: qty 180, 90d supply, fill #1

## 2023-09-08 ENCOUNTER — Other Ambulatory Visit (HOSPITAL_BASED_OUTPATIENT_CLINIC_OR_DEPARTMENT_OTHER): Payer: Self-pay

## 2023-09-27 ENCOUNTER — Other Ambulatory Visit (HOSPITAL_BASED_OUTPATIENT_CLINIC_OR_DEPARTMENT_OTHER): Payer: Self-pay

## 2023-09-28 ENCOUNTER — Other Ambulatory Visit (HOSPITAL_BASED_OUTPATIENT_CLINIC_OR_DEPARTMENT_OTHER): Payer: Self-pay

## 2023-09-28 MED ORDER — ACCU-CHEK GUIDE W/DEVICE KIT
PACK | 1 refills | Status: AC
  Filled 2023-09-28: qty 1, 1d supply, fill #0

## 2023-09-29 ENCOUNTER — Other Ambulatory Visit (HOSPITAL_BASED_OUTPATIENT_CLINIC_OR_DEPARTMENT_OTHER): Payer: Self-pay

## 2023-09-29 MED ORDER — ACCU-CHEK GUIDE TEST VI STRP
ORAL_STRIP | 3 refills | Status: AC
  Filled 2023-09-29: qty 200, 90d supply, fill #0

## 2023-10-12 ENCOUNTER — Other Ambulatory Visit (HOSPITAL_BASED_OUTPATIENT_CLINIC_OR_DEPARTMENT_OTHER): Payer: Self-pay

## 2023-10-12 MED ORDER — AMLODIPINE BESYLATE 10 MG PO TABS
10.0000 mg | ORAL_TABLET | Freq: Every day | ORAL | 0 refills | Status: DC
  Filled 2023-10-12: qty 90, 90d supply, fill #0

## 2023-10-12 MED ORDER — ZOLPIDEM TARTRATE 10 MG PO TABS
10.0000 mg | ORAL_TABLET | Freq: Every evening | ORAL | 0 refills | Status: DC | PRN
  Filled 2023-10-12: qty 30, 30d supply, fill #0

## 2023-10-19 ENCOUNTER — Ambulatory Visit (INDEPENDENT_AMBULATORY_CARE_PROVIDER_SITE_OTHER): Admitting: Family

## 2023-10-19 ENCOUNTER — Other Ambulatory Visit (INDEPENDENT_AMBULATORY_CARE_PROVIDER_SITE_OTHER): Payer: Self-pay

## 2023-10-19 ENCOUNTER — Encounter: Payer: Self-pay | Admitting: Family

## 2023-10-19 ENCOUNTER — Other Ambulatory Visit (HOSPITAL_BASED_OUTPATIENT_CLINIC_OR_DEPARTMENT_OTHER): Payer: Self-pay

## 2023-10-19 DIAGNOSIS — M25552 Pain in left hip: Secondary | ICD-10-CM

## 2023-10-19 DIAGNOSIS — M5416 Radiculopathy, lumbar region: Secondary | ICD-10-CM | POA: Diagnosis not present

## 2023-10-19 MED ORDER — PREDNISONE 50 MG PO TABS
ORAL_TABLET | ORAL | 0 refills | Status: DC
  Filled 2023-10-19: qty 5, 5d supply, fill #0

## 2023-10-19 NOTE — Progress Notes (Signed)
 Office Visit Note   Patient: Dustin Lozano           Date of Birth: 1960-12-10           MRN: [id number] Visit Date: 10/19/2023              Requested by: Katrinka Duwaine LABOR, FNP [address] [place],  [place] [address] PCP: Katrinka Duwaine LABOR, FNP  Chief Complaint  Patient presents with   Left Hip - Pain      HPI: The patient is a 63 year old gentleman who presents today complaining of left hip pain as well as some buttock and groin pain burning pain into his anterior thigh this began about 4 weeks ago when he got new sockets for his bilateral below-knee amputation prostheses one of the leg lengths was unequal to the other he wore these for about 2 weeks before having modified some of his pain has eased since modification but he has begun having the radicular symptoms down the thigh  Has been using Tylenol  as needed with minimal relief 5  Assessment & Plan: Visit Diagnoses:  1. Pain in left hip     Plan: prednisone  burst, OA left hip present. Clinically has lumbar radiculopathy. Will return as needed.  Follow-Up Instructions: No follow-ups on file.   Left Hip Exam   Muscle Strength  The patient has normal left hip strength.   Other  Erythema: absent  Comments:  Painless forward flexion, mild pain with rotation   Back Exam   Tenderness  The patient is experiencing no tenderness.   Muscle Strength  The patient has normal back strength.  Tests  Straight leg raise left: positive      Patient is alert, oriented, no adenopathy, well-dressed, normal affect, normal respiratory effort.     Imaging: No results found. No images are attached to the encounter.  Labs: Lab Results  Component Value Date   HGBA1C 5.9 11/17/2016   HGBA1C 8.2 08/18/2016   HGBA1C 7.8 (H) 05/17/2016     Lab Results  Component Value Date   ALBUMIN 2.5 (L) 05/11/2017   ALBUMIN 4.1 05/17/2016   ALBUMIN 4.3 10/09/2015    Lab Results  Component Value Date    MG 1.9 08/15/2011   MG 2.0 08/14/2011   No results found for: VD25OH  No results found for: PREALBUMIN    Latest Ref Rng & Units 05/11/2017   12:07 PM 05/17/2016    8:25 AM 10/09/2015    9:15 AM  CBC EXTENDED  WBC 4.0 - 10.5 K/uL 22.5  8.0  9.0   RBC 4.22 - 5.81 MIL/uL 4.55  4.52  4.65   Hemoglobin 13.0 - 17.0 g/dL 87.3  86.6  86.1   HCT 39.0 - 52.0 % 37.2  38.7  40.5   Platelets 150 - 400 K/uL 305  292.0  324.0   NEUT# 1.7 - 7.7 K/uL 19.7  4.3  5.5   Lymph# 0.7 - 4.0 K/uL 0.7  2.1  2.2      There is no height or weight on file to calculate BMI.  Orders:  Orders Placed This Encounter  Procedures   XR HIP UNILAT W OR W/O PELVIS 2-3 VIEWS LEFT   No orders of the defined types were placed in this encounter.    Procedures: No procedures performed  Clinical Data: No additional findings.  ROS:  All other systems negative, except as noted in the HPI. Review of Systems  Objective: Vital Signs: There  were no vitals taken for this visit.  Specialty Comments:  No specialty comments available.  PMFS History: Patient Active Problem List   Diagnosis Date Noted   S/P bilateral below knee amputation (HCC) 04/26/2018   Diabetic neuropathy (HCC) 10/22/2015   Chronic systolic heart failure (HCC) 08/24/2011   HTN (hypertension), malignant 08/14/2011   Diabetes mellitus type 2, noninsulin dependent (HCC) 08/14/2011   Past Medical History:  Diagnosis Date   Allergy    Arthritis    CHF (congestive heart failure) (HCC)    Systolic - LVEF 55-60  (06/2012)    Coronary artery disease    Nonobstructive disease per cath 08/2011   Diabetes mellitus    GERD (gastroesophageal reflux disease)    HTN (hypertension)    Hyperlipidemia    Neuromuscular disorder (HCC)    Nerve damage    Family History  Problem Relation Age of Onset   Diabetes Father    Heart failure Father    Cancer Mother        lymphoma   Diabetes Mother    Colon cancer Neg Hx    Esophageal cancer Neg Hx     Rectal cancer Neg Hx    Stomach cancer Neg Hx     Past Surgical History:  Procedure Laterality Date   APPENDECTOMY     BELOW KNEE LEG AMPUTATION Bilateral    BROW LIFT Bilateral 04/21/2022   Procedure: UPPER EYELID BLEPHAROPLASTY;  Surgeon: Luciano Standing, MD;  Location: [facility];  Service: ENT;  Laterality: Bilateral;   COLONOSCOPY     LEFT AND RIGHT HEART CATHETERIZATION WITH CORONARY ANGIOGRAM N/A 08/17/2011   Procedure: LEFT AND RIGHT HEART CATHETERIZATION WITH CORONARY ANGIOGRAM;  Surgeon: Ozell Fell, MD;  Location: [facility] CATH LAB;  Service: Cardiovascular;  Laterality: N/A;   POLYPECTOMY     Social History   Occupational History   Occupation: salesman  Tobacco Use   Smoking status: Former    Current packs/day: 0.00    Average packs/day: 0.7 packs/day for 15.0 years (10.5 ttl pk-yrs)    Types: Cigarettes    Start date: 02/09/1991    Quit date: 02/08/2006    Years since quitting: 17.7   Smokeless tobacco: Never  Substance and Sexual Activity   Alcohol use: Yes    Comment: rare   Drug use: No   Sexual activity: Not on file

## 2023-10-31 ENCOUNTER — Other Ambulatory Visit (HOSPITAL_BASED_OUTPATIENT_CLINIC_OR_DEPARTMENT_OTHER): Payer: Self-pay

## 2023-10-31 MED ORDER — COMIRNATY 30 MCG/0.3ML IM SUSY
0.3000 mL | PREFILLED_SYRINGE | Freq: Once | INTRAMUSCULAR | 0 refills | Status: AC
  Filled 2023-10-31: qty 0.3, 1d supply, fill #0

## 2023-10-31 MED ORDER — FLUZONE 0.5 ML IM SUSY
0.5000 mL | PREFILLED_SYRINGE | Freq: Once | INTRAMUSCULAR | 0 refills | Status: AC
  Filled 2023-10-31: qty 0.5, 1d supply, fill #0

## 2023-11-07 ENCOUNTER — Other Ambulatory Visit (HOSPITAL_BASED_OUTPATIENT_CLINIC_OR_DEPARTMENT_OTHER): Payer: Self-pay

## 2023-11-08 ENCOUNTER — Other Ambulatory Visit (HOSPITAL_BASED_OUTPATIENT_CLINIC_OR_DEPARTMENT_OTHER): Payer: Self-pay

## 2023-11-08 MED ORDER — LISINOPRIL 40 MG PO TABS
40.0000 mg | ORAL_TABLET | Freq: Every day | ORAL | 0 refills | Status: DC
  Filled 2023-11-08: qty 90, 90d supply, fill #0

## 2023-12-05 ENCOUNTER — Other Ambulatory Visit: Payer: Self-pay

## 2023-12-05 ENCOUNTER — Other Ambulatory Visit (HOSPITAL_BASED_OUTPATIENT_CLINIC_OR_DEPARTMENT_OTHER): Payer: Self-pay

## 2023-12-05 MED ORDER — TAMSULOSIN HCL 0.4 MG PO CAPS
0.4000 mg | ORAL_CAPSULE | Freq: Every day | ORAL | 0 refills | Status: DC
  Filled 2023-12-05: qty 90, 90d supply, fill #0

## 2023-12-05 MED ORDER — ICOSAPENT ETHYL 1 G PO CAPS
2.0000 g | ORAL_CAPSULE | Freq: Two times a day (BID) | ORAL | 1 refills | Status: AC
  Filled 2023-12-05: qty 360, 90d supply, fill #0
  Filled 2024-03-09: qty 360, 90d supply, fill #1

## 2023-12-05 MED ORDER — CARVEDILOL 12.5 MG PO TABS
12.5000 mg | ORAL_TABLET | Freq: Two times a day (BID) | ORAL | 0 refills | Status: DC
  Filled 2023-12-05: qty 180, 90d supply, fill #0

## 2023-12-12 ENCOUNTER — Encounter: Payer: Self-pay | Admitting: Radiology

## 2023-12-20 ENCOUNTER — Encounter (HOSPITAL_BASED_OUTPATIENT_CLINIC_OR_DEPARTMENT_OTHER): Payer: Self-pay

## 2023-12-20 ENCOUNTER — Emergency Department (HOSPITAL_BASED_OUTPATIENT_CLINIC_OR_DEPARTMENT_OTHER)

## 2023-12-20 ENCOUNTER — Other Ambulatory Visit: Payer: Self-pay

## 2023-12-20 ENCOUNTER — Inpatient Hospital Stay (HOSPITAL_BASED_OUTPATIENT_CLINIC_OR_DEPARTMENT_OTHER)
Admission: EM | Admit: 2023-12-20 | Discharge: 2023-12-23 | DRG: 638 | Disposition: A | Discharge status: Home / Self Care | Attending: Internal Medicine | Admitting: Internal Medicine

## 2023-12-20 DIAGNOSIS — Z87891 Personal history of nicotine dependence: Secondary | ICD-10-CM | POA: Diagnosis not present

## 2023-12-20 DIAGNOSIS — R1013 Epigastric pain: Secondary | ICD-10-CM | POA: Diagnosis not present

## 2023-12-20 DIAGNOSIS — Z79899 Other long term (current) drug therapy: Secondary | ICD-10-CM

## 2023-12-20 DIAGNOSIS — K8020 Calculus of gallbladder without cholecystitis without obstruction: Secondary | ICD-10-CM | POA: Diagnosis present

## 2023-12-20 DIAGNOSIS — E8729 Other acidosis: Secondary | ICD-10-CM

## 2023-12-20 DIAGNOSIS — N400 Enlarged prostate without lower urinary tract symptoms: Secondary | ICD-10-CM | POA: Diagnosis present

## 2023-12-20 DIAGNOSIS — D75839 Thrombocytosis, unspecified: Secondary | ICD-10-CM | POA: Diagnosis present

## 2023-12-20 DIAGNOSIS — I5022 Chronic systolic (congestive) heart failure: Secondary | ICD-10-CM | POA: Diagnosis present

## 2023-12-20 DIAGNOSIS — E785 Hyperlipidemia, unspecified: Secondary | ICD-10-CM | POA: Diagnosis present

## 2023-12-20 DIAGNOSIS — E1165 Type 2 diabetes mellitus with hyperglycemia: Secondary | ICD-10-CM | POA: Diagnosis not present

## 2023-12-20 DIAGNOSIS — I700 Atherosclerosis of aorta: Secondary | ICD-10-CM | POA: Diagnosis present

## 2023-12-20 DIAGNOSIS — G4700 Insomnia, unspecified: Secondary | ICD-10-CM | POA: Diagnosis present

## 2023-12-20 DIAGNOSIS — I110 Hypertensive heart disease with heart failure: Secondary | ICD-10-CM | POA: Diagnosis present

## 2023-12-20 DIAGNOSIS — E1110 Type 2 diabetes mellitus with ketoacidosis without coma: Principal | ICD-10-CM | POA: Diagnosis present

## 2023-12-20 DIAGNOSIS — I2510 Atherosclerotic heart disease of native coronary artery without angina pectoris: Secondary | ICD-10-CM | POA: Diagnosis present

## 2023-12-20 DIAGNOSIS — Z7982 Long term (current) use of aspirin: Secondary | ICD-10-CM

## 2023-12-20 DIAGNOSIS — Z833 Family history of diabetes mellitus: Secondary | ICD-10-CM | POA: Diagnosis not present

## 2023-12-20 DIAGNOSIS — Z7984 Long term (current) use of oral hypoglycemic drugs: Secondary | ICD-10-CM

## 2023-12-20 DIAGNOSIS — Z8249 Family history of ischemic heart disease and other diseases of the circulatory system: Secondary | ICD-10-CM | POA: Diagnosis not present

## 2023-12-20 DIAGNOSIS — I160 Hypertensive urgency: Secondary | ICD-10-CM | POA: Diagnosis present

## 2023-12-20 DIAGNOSIS — Z1152 Encounter for screening for COVID-19: Secondary | ICD-10-CM | POA: Diagnosis not present

## 2023-12-20 DIAGNOSIS — E119 Type 2 diabetes mellitus without complications: Secondary | ICD-10-CM

## 2023-12-20 DIAGNOSIS — Z7985 Long-term (current) use of injectable non-insulin antidiabetic drugs: Secondary | ICD-10-CM

## 2023-12-20 DIAGNOSIS — E872 Acidosis, unspecified: Secondary | ICD-10-CM

## 2023-12-20 DIAGNOSIS — E1140 Type 2 diabetes mellitus with diabetic neuropathy, unspecified: Secondary | ICD-10-CM | POA: Diagnosis present

## 2023-12-20 DIAGNOSIS — K219 Gastro-esophageal reflux disease without esophagitis: Secondary | ICD-10-CM | POA: Diagnosis present

## 2023-12-20 DIAGNOSIS — Z89511 Acquired absence of right leg below knee: Secondary | ICD-10-CM | POA: Diagnosis not present

## 2023-12-20 DIAGNOSIS — A419 Sepsis, unspecified organism: Principal | ICD-10-CM

## 2023-12-20 DIAGNOSIS — K760 Fatty (change of) liver, not elsewhere classified: Secondary | ICD-10-CM | POA: Diagnosis present

## 2023-12-20 DIAGNOSIS — E860 Dehydration: Secondary | ICD-10-CM | POA: Diagnosis present

## 2023-12-20 DIAGNOSIS — Z807 Family history of other malignant neoplasms of lymphoid, hematopoietic and related tissues: Secondary | ICD-10-CM

## 2023-12-20 DIAGNOSIS — R739 Hyperglycemia, unspecified: Secondary | ICD-10-CM

## 2023-12-20 DIAGNOSIS — R112 Nausea with vomiting, unspecified: Secondary | ICD-10-CM

## 2023-12-20 DIAGNOSIS — N179 Acute kidney failure, unspecified: Secondary | ICD-10-CM | POA: Diagnosis present

## 2023-12-20 DIAGNOSIS — Z89512 Acquired absence of left leg below knee: Secondary | ICD-10-CM

## 2023-12-20 LAB — URINALYSIS, W/ REFLEX TO CULTURE (INFECTION SUSPECTED)
Bacteria, UA: NONE SEEN
Bilirubin Urine: NEGATIVE
Glucose, UA: >1000 mg/dL — AB
Ketones, ur: 15 mg/dL — AB
Leukocytes,Ua: NEGATIVE
Nitrite: NEGATIVE
Protein, ur: 30 mg/dL — AB
Specific Gravity, Urine: 1.039 — ABNORMAL HIGH (ref 1.005–1.030)
pH: 5.5 (ref 5.0–8.0)

## 2023-12-20 LAB — BETA-HYDROXYBUTYRIC ACID: Beta-Hydroxybutyric Acid: 1.33 mmol/L — ABNORMAL HIGH (ref 0.05–0.27)

## 2023-12-20 LAB — I-STAT VENOUS BLOOD GAS, ED
Acid-base deficit: 1 mmol/L (ref 0.0–2.0)
Bicarbonate: 20.1 mmol/L (ref 20.0–28.0)
Calcium, Ion: 1.17 mmol/L (ref 1.15–1.40)
HCT: 44 % (ref 39.0–52.0)
Hemoglobin: 15 g/dL (ref 13.0–17.0)
O2 Saturation: 59 %
Potassium: 4.3 mmol/L (ref 3.5–5.1)
Sodium: 136 mmol/L (ref 135–145)
TCO2: 21 mmol/L — ABNORMAL LOW (ref 22–32)
pCO2, Ven: 24.5 mmHg — ABNORMAL LOW (ref 44–60)
pH, Ven: 7.522 — ABNORMAL HIGH (ref 7.25–7.43)
pO2, Ven: 27 mmHg — CL (ref 32–45)

## 2023-12-20 LAB — CBC WITH DIFFERENTIAL/PLATELET
Abs Immature Granulocytes: 0.07 K/uL (ref 0.00–0.07)
Basophils Absolute: 0 K/uL (ref 0.0–0.1)
Basophils Relative: 0 %
Eosinophils Absolute: 0 K/uL (ref 0.0–0.5)
Eosinophils Relative: 0 %
HCT: 43.8 % (ref 39.0–52.0)
Hemoglobin: 15.6 g/dL (ref 13.0–17.0)
Immature Granulocytes: 0 %
Lymphocytes Relative: 6 %
Lymphs Abs: 1.2 K/uL (ref 0.7–4.0)
MCH: 30.1 pg (ref 26.0–34.0)
MCHC: 35.6 g/dL (ref 30.0–36.0)
MCV: 84.4 fL (ref 80.0–100.0)
Monocytes Absolute: 1.7 K/uL — ABNORMAL HIGH (ref 0.1–1.0)
Monocytes Relative: 9 %
Neutro Abs: 16.4 K/uL — ABNORMAL HIGH (ref 1.7–7.7)
Neutrophils Relative %: 85 %
Platelets: 405 K/uL — ABNORMAL HIGH (ref 150–400)
RBC: 5.19 MIL/uL (ref 4.22–5.81)
RDW: 13.1 % (ref 11.5–15.5)
WBC: 19.4 K/uL — ABNORMAL HIGH (ref 4.0–10.5)
nRBC: 0 % (ref 0.0–0.2)

## 2023-12-20 LAB — COMPREHENSIVE METABOLIC PANEL WITH GFR
ALT: 35 U/L (ref 0–44)
AST: 32 U/L (ref 15–41)
Albumin: 4.8 g/dL (ref 3.5–5.0)
Alkaline Phosphatase: 110 U/L (ref 38–126)
Anion gap: 21 — ABNORMAL HIGH (ref 5–15)
BUN: 25 mg/dL — ABNORMAL HIGH (ref 8–23)
CO2: 20 mmol/L — ABNORMAL LOW (ref 22–32)
Calcium: 10.5 mg/dL — ABNORMAL HIGH (ref 8.9–10.3)
Chloride: 95 mmol/L — ABNORMAL LOW (ref 98–111)
Creatinine, Ser: 1.45 mg/dL — ABNORMAL HIGH (ref 0.61–1.24)
GFR, Estimated: 55 mL/min — ABNORMAL LOW (ref >60)
Glucose, Bld: 441 mg/dL — ABNORMAL HIGH (ref 70–99)
Potassium: 4.3 mmol/L (ref 3.5–5.1)
Sodium: 137 mmol/L (ref 135–145)
Total Bilirubin: 0.9 mg/dL (ref 0.0–1.2)
Total Protein: 8.1 g/dL (ref 6.5–8.1)

## 2023-12-20 LAB — RESP PANEL BY RT-PCR (RSV, FLU A&B, COVID)  RVPGX2
Influenza A by PCR: NEGATIVE
Influenza B by PCR: NEGATIVE
Resp Syncytial Virus by PCR: NEGATIVE
SARS Coronavirus 2 by RT PCR: NEGATIVE

## 2023-12-20 LAB — CBG MONITORING, ED
Glucose-Capillary: 422 mg/dL — ABNORMAL HIGH (ref 70–99)
Glucose-Capillary: 278 mg/dL — ABNORMAL HIGH (ref 70–99)

## 2023-12-20 LAB — LACTIC ACID, PLASMA
Lactic Acid, Venous: 4.7 mmol/L (ref 0.5–1.9)
Lactic Acid, Venous: 3.6 mmol/L (ref 0.5–1.9)

## 2023-12-20 LAB — PROTIME-INR
INR: 0.9 (ref 0.8–1.2)
Prothrombin Time: 12.9 s (ref 11.4–15.2)

## 2023-12-20 LAB — GLUCOSE, CAPILLARY: Glucose-Capillary: 284 mg/dL — ABNORMAL HIGH (ref 70–99)

## 2023-12-20 MED ORDER — ENOXAPARIN SODIUM 40 MG/0.4ML IJ SOSY
40.0000 mg | PREFILLED_SYRINGE | INTRAMUSCULAR | Status: DC
  Administered 2023-12-21 – 2023-12-23 (×3): 40 mg via SUBCUTANEOUS
  Filled 2023-12-20 (×3): qty 0.4

## 2023-12-20 MED ORDER — LACTATED RINGERS IV BOLUS
1000.0000 mL | Freq: Once | INTRAVENOUS | Status: AC
  Administered 2023-12-20: 1000 mL via INTRAVENOUS

## 2023-12-20 MED ORDER — ACETAMINOPHEN 650 MG RE SUPP: 650.0000 mg | Freq: Four times a day (QID) | RECTAL | Status: DC | PRN

## 2023-12-20 MED ORDER — IOHEXOL 300 MG/ML  SOLN
100.0000 mL | Freq: Once | INTRAMUSCULAR | Status: AC | PRN
  Administered 2023-12-20: 100 mL via INTRAVENOUS

## 2023-12-20 MED ORDER — BISACODYL 5 MG PO TBEC
5.0000 mg | DELAYED_RELEASE_TABLET | Freq: Every day | ORAL | Status: DC | PRN
Start: 2023-12-20 — End: 2023-12-23

## 2023-12-20 MED ORDER — ONDANSETRON HCL 4 MG PO TABS: 4.0000 mg | ORAL_TABLET | Freq: Four times a day (QID) | ORAL | Status: DC | PRN

## 2023-12-20 MED ORDER — SENNOSIDES-DOCUSATE SODIUM 8.6-50 MG PO TABS: 1.0000 | ORAL_TABLET | Freq: Every evening | ORAL | Status: DC | PRN

## 2023-12-20 MED ORDER — ACETAMINOPHEN 325 MG PO TABS
650.0000 mg | ORAL_TABLET | Freq: Four times a day (QID) | ORAL | Status: DC | PRN
  Administered 2023-12-21: 650 mg via ORAL
  Filled 2023-12-20: qty 2

## 2023-12-20 MED ORDER — PANTOPRAZOLE SODIUM 40 MG IV SOLR
40.0000 mg | Freq: Every day | INTRAVENOUS | Status: DC
  Administered 2023-12-20 – 2023-12-21 (×2): 40 mg via INTRAVENOUS
  Filled 2023-12-20 (×2): qty 10

## 2023-12-20 MED ORDER — ONDANSETRON HCL 4 MG/2ML IJ SOLN
4.0000 mg | Freq: Four times a day (QID) | INTRAMUSCULAR | Status: DC | PRN
  Administered 2023-12-20: 4 mg via INTRAVENOUS
  Filled 2023-12-20: qty 2

## 2023-12-20 MED ORDER — HYDRALAZINE HCL 20 MG/ML IJ SOLN
10.0000 mg | Freq: Four times a day (QID) | INTRAMUSCULAR | Status: DC | PRN
  Administered 2023-12-20 – 2023-12-21 (×2): 10 mg via INTRAVENOUS
  Filled 2023-12-20 (×3): qty 1

## 2023-12-20 MED ORDER — LACTATED RINGERS IV SOLN: INTRAVENOUS | Status: DC

## 2023-12-20 MED ORDER — METOCLOPRAMIDE HCL 5 MG/ML IJ SOLN
10.0000 mg | Freq: Once | INTRAMUSCULAR | Status: AC
  Administered 2023-12-20: 10 mg via INTRAVENOUS
  Filled 2023-12-20: qty 2

## 2023-12-20 MED ORDER — FENTANYL CITRATE (PF) 50 MCG/ML IJ SOSY
50.0000 ug | PREFILLED_SYRINGE | Freq: Once | INTRAMUSCULAR | Status: AC
  Administered 2023-12-20: 50 ug via INTRAVENOUS
  Filled 2023-12-20: qty 1

## 2023-12-20 MED ORDER — INSULIN ASPART 100 UNIT/ML IJ SOLN
0.0000 [IU] | INTRAMUSCULAR | Status: DC
  Administered 2023-12-21 (×5): 3 [IU] via SUBCUTANEOUS
  Administered 2023-12-21: 8 [IU] via SUBCUTANEOUS
  Administered 2023-12-22 (×3): 3 [IU] via SUBCUTANEOUS
  Administered 2023-12-23 (×3): 2 [IU] via SUBCUTANEOUS
  Administered 2023-12-23: 3 [IU] via SUBCUTANEOUS
  Filled 2023-12-20 (×4): qty 3
  Filled 2023-12-20: qty 2
  Filled 2023-12-20 (×2): qty 3
  Filled 2023-12-20: qty 8
  Filled 2023-12-20 (×5): qty 3

## 2023-12-20 MED ORDER — ONDANSETRON HCL 4 MG/2ML IJ SOLN
4.0000 mg | Freq: Once | INTRAMUSCULAR | Status: AC
  Administered 2023-12-20: 4 mg via INTRAVENOUS
  Filled 2023-12-20: qty 2

## 2023-12-20 NOTE — ED Provider Notes (Signed)
 Patient signed out to me at 1530 by Dr. Jerrol pending labs and imaging. In short this is a 63 year old male with PMH HTN, FM, CHF presenting to the ED with nausea, vomiting and abdominal pain. He was tachycardic and hyperglycemic to the 400's on arrival. Work up initiated to evaluate for sepsis vs DKA and started on fluids, nausea and pain control.  On my evaluation, the patient is tachycardic and ill-appearing, does report some improvement of his symptoms after nausea and pain control.  He states that he is not having diffuse abdominal pain.  The patient's abdomen is soft with no point tenderness and is just generalized tenderness.  On my evaluation the patient suddenly started to develop some chest pain that he described as difficulty breathing.  His lungs are clear to auscultation bilaterally.  He is satting 100% on 1L oxygen for comfort. He will repeat EKG and chest x-ray added.  Clinical Course as of 12/20/23 1847  Tue Dec 20, 2023  1503 Glucose-Capillary(!): 422 [JL]  1637 AKI with Cr 1.45 from baseline 1.1 last month, AGAP metabolic acidosis. Possible combine lactic acidosis vs DKA, BHB is pending. [VK]  1811 No intraabdominal source of infection, minimally elevated BHB making DKA less likely. With sepsis though will recommend admission for continued treatment. Will discuss with hospitalist regarding starting insulin  drip. [VK]    Clinical Course User Index [JL] Jerrol Agent, MD [VK] Kingsley, Khaidyn Staebell K, DO      Kingsley, Rejoice Heatwole K, [place] 12/20/23 [phone]

## 2023-12-20 NOTE — ED Notes (Signed)
 Pt placed on 2 LPM [place] for comfort and low pulse Ox reading after being medicated.

## 2023-12-20 NOTE — ED Notes (Addendum)
 CBG=278

## 2023-12-20 NOTE — Progress Notes (Addendum)
 Plan of Care Note for accepted transfer   Patient: Dustin Lozano MRN: [id number]   DOA: 12/20/2023  Facility requesting transfer: [facility] ED Requesting Provider: Ellouise Fine, DO Reason for transfer: Unconlolled DM2 with hyperglycemia, ? sepsis Facility course: 63 yo with60 year old male with medical history significant for HTN, DM 2, CHF, history of bilateral BKA's, HLD presenting to the emergency department with abdominal pain, nausea and vomiting.  The patient endorses epigastric abdominal pain.  He is on uncontrolled nausea and vomiting for the past 2 days.  He has been able to keep down any of his medications or have anything to drink.  He tried taking sips of ginger ale this morning.  He states that he is passing gas.  His last bowel movement was yesterday.  He endorses 10 out of 10 abdominal pain in the upper abdomen.   Upon presentation to the emergency room, BP was 178/123 with heart rate of 138 with otherwise normal vital signs.  Later on respiratory rate was 22.  VBG showed pH 7.52 with a pCO2 of 24.5 pO2 27 HCO3 20.1 and O2 sat of 59%.  CMP was remarkable for a blood glucose of 441 with a CO2 of 20 and chloride of 95 creatinine 1.45 better than previous levels and calcium  of 10.5 with anion gap of 21.  Lactic acid was 4.7 and later 3.6.  CBC showed leukocytosis 19.4 with neutrophilia and thrombocytosis.  Beta-hydroxybutyrate was 1.33.  Repeated blood glucose was 278 and later 284.  Urinalysis showed high specific gravity 1039 with 30 protein 15 ketones and more than 1000 glucose.  EKG showed sinus tachycardia with a rate of 129 with no acute changes.   Portable chest x-ray showed no acute cardiopulmonary disease.  CT of the abdomen and pelvis with contrast revealed the following: 1. No acute intra-abdominal or pelvic pathology. 2. Fatty liver. 3. Cholelithiasis. 4. Postsurgical changes of the bowel with ileocolic anastomosis in the right lower quadrant. No bowel obstruction. 5.   Aortic Atherosclerosis.  The patient was given 4 mg of IV Zofran  and 10 mg of IV Reglan as well as 3 L bolus of IV lactated ringer and 50 mcg of IV fentanyl .  The patient was not started on IV insulin  drip given improvement of her blood glucose levels and normal CO2 level.  Plan of care: The patient is accepted for admission to Telemetry unit, at [facility]..  The pt will be under the care and responsibility of the EDP until arrival to [facility].  Author: Madison DELENA Peaches, MD 12/20/2023  Check [email or web address] for on-call coverage.  Nursing staff, Please call [facility] Admits & Consults System-Wide number on Amion as soon as patient's arrival, so appropriate admitting provider can evaluate the pt.

## 2023-12-20 NOTE — ED Notes (Signed)
 One set of cultures sent in triage

## 2023-12-20 NOTE — ED Notes (Signed)
 Pt is aware urine sample is needed when he is able to use the restroom.

## 2023-12-20 NOTE — ED Provider Notes (Signed)
 [place] EMERGENCY DEPARTMENT AT [facility] Provider Note   CSN: [id number] Arrival date & time: 12/20/23  1404     Patient presents with: Emesis and Nasal Congestion   Dustin Lozano is a 63 y.o. male.    Emesis Associated symptoms: abdominal pain      63 year old male with medical history significant for HTN, DM 2, CHF, history of bilateral BKA's, HLD presenting to the emergency department with abdominal pain, nausea and vomiting.  The patient endorses epigastric abdominal pain.  He is on uncontrolled nausea and vomiting for the past 2 days.  He has been able to keep down any of his medications or have anything to drink.  He tried taking sips of ginger ale this morning.  He states that he is passing gas.  His last bowel movement was yesterday.  He endorses 10 out of 10 abdominal pain in the upper abdomen.  Prior to Admission medications   Medication Sig Start Date End Date Taking? Authorizing Provider  ACCU-CHEK FASTCLIX LANCETS MISC 1 Device by Does not apply route 2 (two) times daily. 08/18/16   Nafziger, Darleene, NP  acetaminophen  (TYLENOL ) 325 MG tablet Take 650 mg by mouth every 6 (six) hours as needed.    [provider]  amLODipine  (NORVASC ) 10 MG tablet Take 1 tablet (10 mg total) by mouth daily. 06/18/22     amLODipine  (NORVASC ) 10 MG tablet Take 1 tablet (10 mg total) by mouth daily. 07/29/22     amLODipine  (NORVASC ) 10 MG tablet Take 1 tablet (10 mg total) by mouth daily. 10/12/23     aspirin  EC 81 MG tablet Take 81 mg by mouth daily.    [provider]  atorvastatin  (LIPITOR) 20 MG tablet Take 20 mg by mouth daily.    [provider]  atorvastatin  (LIPITOR) 20 MG tablet Take 1 tablet (20 mg total) by mouth daily. 06/18/22     atorvastatin  (LIPITOR) 20 MG tablet Take 1 tablet (20 mg total) by mouth daily. 05/23/23     Blood Glucose Monitoring Suppl (ACCU-CHEK GUIDE) w/Device KIT Use to check blood sugar 3 times daily as directed 09/28/23      carvedilol  (COREG ) 12.5 MG tablet Take 1 tablet (12.5 mg total) by mouth 2 (two) times daily with a meal. Take one tablet daily in the AM. 11/10/16   Nafziger, Darleene, NP  carvedilol  (COREG ) 12.5 MG tablet Take 1 tablet (12.5 mg total) by mouth in the morning and 1 tablet (12.5 mg total) in the evening. Take with meals. 06/18/22     carvedilol  (COREG ) 12.5 MG tablet Take 1 tablet (12.5 mg total) by mouth 2 (two) times daily between meals. 12/05/23     clotrimazole -betamethasone  (LOTRISONE ) cream Apply 1 application topically 2 (two) times daily. 01/14/15   Nafziger, Darleene, NP  dapagliflozin  propanediol (FARXIGA ) 5 MG TABS tablet Take 1 tablet (5 mg total) by mouth daily. 06/18/22     dapagliflozin  propanediol (FARXIGA ) 5 MG TABS tablet Take 1 tablet (5 mg total) by mouth daily. 03/30/23     dapagliflozin  propanediol (FARXIGA ) 5 MG TABS tablet Take 1 tablet (5 mg total) by mouth daily. 09/07/23     doxylamine, Sleep, (UNISOM) 25 MG tablet Take 25 mg by mouth at bedtime as needed for sleep.    [provider]  Dulaglutide  (TRULICITY  [place]) Inject 1.5 mg into the skin once a week.    [provider]  Dulaglutide  (TRULICITY ) 3 MG/0.5ML SOAJ Inject 3 mg into the skin once  a week. 08/18/23     Dulaglutide  (TRULICITY ) 3 MG/0.5ML SOAJ Inject 3 mg into the skin every 7 (seven) days. 09/07/23     Emollient (RESTA) CREA Apply topically. Apply to dry skin on residual limbs as directed    Therisa Rush, MD  erythromycin  ophthalmic ointment Apply to eyelid incisions 04/21/22   Luciano Standing, MD  FREESTYLE LITE test strip USE ONE STRIP TO CHECK GLUCOSE TWICE DAILY.    Webb, Padonda B, FNP  gabapentin  (NEURONTIN ) 800 MG tablet Take 1 tablet (800 mg total) by mouth 2 (two) times daily. 09/07/23     glucose blood (ACCU-CHEK GUIDE TEST) test strip Test 2 (two) times a day. Use as instructed 09/29/23     icosapent  Ethyl (VASCEPA ) 1 g capsule Take 2 capsules (2 g total) by mouth 2 (two) times daily. 12/05/23      Insulin  Pen Needle (BD PEN NEEDLE NANO U/F) 32G X 4 MM MISC Test blood sugars as directed. Patient not taking: Reported on 07/10/2018 05/09/15   Merna Huxley, NP  lisinopril  (PRINIVIL ,ZESTRIL ) 2.5 MG tablet Take 2.5 mg by mouth daily.    Therisa Rush, MD  lisinopril  (ZESTRIL ) 40 MG tablet Take 1 tablet (40 mg total) by mouth daily. 06/18/22     lisinopril  (ZESTRIL ) 40 MG tablet Take 1 tablet (40 mg total) by mouth daily. 11/08/23     metFORMIN  (GLUCOPHAGE -XR) 500 MG 24 hr tablet Take 500 mg by mouth 2 (two) times daily.    [provider]  metFORMIN  (GLUCOPHAGE -XR) 500 MG 24 hr tablet Take 2 tablets (1,000 mg total) by mouth 2 (two) times daily. 06/18/22     metFORMIN  (GLUCOPHAGE -XR) 500 MG 24 hr tablet Take 2 tablets (1,000 mg total) by mouth 2 (two) times daily. 08/18/23     Multiple Vitamins-Minerals (ONE-A-DAY MENS 50+ ADVANTAGE) TABS Take 1 tablet by mouth daily.    [provider]  omeprazole  (PRILOSEC) 40 MG capsule Take 1 capsule (40 mg total) by mouth daily. 06/18/22     omeprazole  (PRILOSEC) 40 MG capsule Take 1 capsule (40 mg total) by mouth daily. 05/23/23     predniSONE  (DELTASONE ) 50 MG tablet Take one tablet by mouth once daily for 5 days. 10/19/23   Valdemar Rocky SAUNDERS, NP  tamsulosin  (FLOMAX ) 0.4 MG CAPS capsule Take 0.4 mg by mouth.    [provider]  tamsulosin  (FLOMAX ) 0.4 MG CAPS capsule Take 1 capsule (0.4 mg total) by mouth daily. 06/18/22     tamsulosin  (FLOMAX ) 0.4 MG CAPS capsule Take 1 capsule (0.4 mg total) by mouth daily. 12/05/23     zolpidem  (AMBIEN ) 10 MG tablet Take 10 mg by mouth at bedtime as needed for sleep.    [provider]  zolpidem  (AMBIEN ) 10 MG tablet Take 1 tablet (10 mg total) by mouth at bedtime as needed for sleep. 10/12/23       Allergies: Patient has no known allergies.    Review of Systems  Gastrointestinal:  Positive for abdominal pain, nausea and vomiting.  All other systems reviewed and are negative.   Updated  Vital Signs BP (!) 178/123 (BP Location: Left Arm)   Pulse (!) 138   Temp 98.3 F (36.8 C)   Resp (!) 22   SpO2 100%   Physical Exam Vitals and nursing note reviewed.  Constitutional:      General: He is not in acute distress.    Appearance: He is well-developed.  HENT:     Head: Normocephalic and atraumatic.  Eyes:     Conjunctiva/sclera: Conjunctivae normal.  Cardiovascular:     Rate and Rhythm: Normal rate and regular rhythm.     Heart sounds: No murmur heard. Pulmonary:     Effort: Pulmonary effort is normal. No respiratory distress.     Breath sounds: Normal breath sounds.  Abdominal:     Palpations: Abdomen is soft.     Tenderness: There is generalized abdominal tenderness and tenderness in the right upper quadrant, epigastric area and left upper quadrant. There is guarding and rebound.  Musculoskeletal:        General: No swelling.     Cervical back: Neck supple.  Skin:    General: Skin is warm and dry.     Capillary Refill: Capillary refill takes less than 2 seconds.  Neurological:     Mental Status: He is alert.  Psychiatric:        Mood and Affect: Mood normal.     (all labs ordered are listed, but only abnormal results are displayed) Labs Reviewed  CBG MONITORING, ED - Abnormal; Notable for the following components:      Result Value   Glucose-Capillary 422 (*)    All other components within normal limits  CULTURE, BLOOD (ROUTINE X 2)  CULTURE, BLOOD (ROUTINE X 2)  RESP PANEL BY RT-PCR (RSV, FLU A&B, COVID)  [id number]  COMPREHENSIVE METABOLIC PANEL WITH GFR  LACTIC ACID, PLASMA  LACTIC ACID, PLASMA  CBC WITH DIFFERENTIAL/PLATELET  PROTIME-INR  URINALYSIS, W/ REFLEX TO CULTURE (INFECTION SUSPECTED)    EKG: None  Radiology: No results found.   .Critical Care  Performed by: Jerrol Agent, MD Authorized by: Jerrol Agent, MD   Critical care provider statement:    Critical care time (minutes):  30   Critical care was necessary to treat or  prevent imminent or life-threatening deterioration of the following conditions:  Sepsis   Critical care was time spent personally by me on the following activities:  Development of treatment plan with patient or surrogate, discussions with consultants, evaluation of patient's response to treatment, examination of patient, ordering and review of laboratory studies, ordering and review of radiographic studies, ordering and performing treatments and interventions, pulse oximetry, re-evaluation of patient's condition and review of old charts    Medications Ordered in the ED  ondansetron  (ZOFRAN ) injection 4 mg (4 mg Intravenous Given 12/20/23 1501)    Clinical Course as of 12/20/23 1516  Tue Dec 20, 2023  1503 Glucose-Capillary(!): 422 [JL]    Clinical Course User Index [JL] Jerrol Agent, MD                                 Medical Decision Making Amount and/or Complexity of Data Reviewed Labs: ordered. Decision-making details documented in ED Course. Radiology: ordered.  Risk Prescription drug management.     63 year old male with medical history significant for HTN, DM 2, CHF, history of bilateral BKA's, HLD presenting to the emergency department with abdominal pain, nausea and vomiting.  The patient endorses epigastric abdominal pain.  He is on uncontrolled nausea and vomiting for the past 2 days.  He has been able to keep down any of his medications or have anything to drink.  He tried taking sips of ginger ale this morning.  He states that he is passing gas.  His last bowel movement was yesterday.  He endorses 10 out of 10 abdominal pain in the upper abdomen.  On arrival,  the patient was afebrile, tachycardic heart rate 138, not tachypneic RR 19, BP 178/123, saturating high percent on room air.  CBG on arrival 422.  DKA workup initiated in the setting the patient's known diabetes.  He takes Trulicity  and metformin .  Concern for bowel obstruction, concern for DKA, concern for other  acute intra-abdominal catastrophe.  Workup initiated to include septic workup and DKA workup, will obtain CT abdomen pelvis to further evaluate.  Patient initially resuscitated with an IV fluid bolus, IV Zofran  for nausea control and fentanyl  for pain control.  Workup pending however patient meeting SIRS criteria with initial CBC, back elevated with a leukocytosis at 19.4 with a neutrophilia and left shift.  Concern for sepsis from an intra-abdominal etiology versus DKA.  Signout given to Dr. Edwin to follow-up results of diagnostic testing, ultimate disposition finding results of diagnostics and reevaluation     Final diagnoses:  None    ED Discharge Orders     None          Jerrol Agent, MD 12/20/23 [phone]

## 2023-12-20 NOTE — ED Notes (Signed)
 Pt was able to come off of oxygen and maintain adequate pulse ox readings.

## 2023-12-20 NOTE — ED Notes (Signed)
 Second blood culture was collected at 1535 right wrist

## 2023-12-20 NOTE — H&P (Incomplete)
 History and Physical  DOREN KASPAR FMW:[id number] DOB: 1960-11-22 DOA: 12/20/2023  PCP: Emerick Avelina POUR, PA-C   Chief Complaint: Abdominal pain, nausea and vomiting  HPI: Dustin Lozano is a 63 y.o. male with medical history significant for chronic systolic heart failure, uncontrolled T2DM, HTN, bilateral BKA, CAD, GERD, HLD and diabetic neuropathy who presented to [facility] for evaluation of abdominal pain, nausea and vomiting.   ED Course: Initial vitals show patient afebrile, RR 17-22, HR 110-130s, SBP 170-190s, SpO2 100% on room air. Initial labs significant for bicarb 20, glucose 441, BUN/creatinine 25/1.45, calcium  10.5, anion gap 21, WBC 19.4, Hgb 15.6, platelet 405, lactic acid 4.7-3.6, UA with significant glucosuria, hemoglobinuria and ketonuria but no signs of infection. EKG shows sinus tach. CXR shows no active disease.  CT A/P shows fatty liver and cholelithiasis but no acute intra-abdominal pathology. Pt received IV LR 1 L bolus x 2, IV Reglan, IV Zofran  and IV fentanyl .  Patient was admitted to [facility] service and transferred to [facility].  Review of Systems: Please see HPI for pertinent positives and negatives. A complete 10 system review of systems are otherwise negative.  Past Medical History:  Diagnosis Date   Allergy    Arthritis    CHF (congestive heart failure) (HCC)    Systolic - LVEF 55-60  (06/2012)    Coronary artery disease    Nonobstructive disease per cath 08/2011   Diabetes mellitus    GERD (gastroesophageal reflux disease)    HTN (hypertension)    Hyperlipidemia    Neuromuscular disorder (HCC)    Nerve damage   Past Surgical History:  Procedure Laterality Date   APPENDECTOMY     BELOW KNEE LEG AMPUTATION Bilateral    BROW LIFT Bilateral 04/21/2022   Procedure: UPPER EYELID BLEPHAROPLASTY;  Surgeon: Luciano Standing, MD;  Location: [facility];  Service: ENT;  Laterality: Bilateral;   COLONOSCOPY     LEFT AND RIGHT HEART  CATHETERIZATION WITH CORONARY ANGIOGRAM N/A 08/17/2011   Procedure: LEFT AND RIGHT HEART CATHETERIZATION WITH CORONARY ANGIOGRAM;  Surgeon: Ozell Fell, MD;  Location: [facility];  Service: Cardiovascular;  Laterality: N/A;   POLYPECTOMY     Social History:  reports that he quit smoking about 17 years ago. His smoking use included cigarettes. He started smoking about 32 years ago. He has a 10.5 pack-year smoking history. He has never used smokeless tobacco. He reports current alcohol use. He reports that he does not use drugs.  No Known Allergies  Family History  Problem Relation Age of Onset   Diabetes Father    Heart failure Father    Cancer Mother        lymphoma   Diabetes Mother    Colon cancer Neg Hx    Esophageal cancer Neg Hx    Rectal cancer Neg Hx    Stomach cancer Neg Hx      Prior to Admission medications   Medication Sig Start Date End Date Taking? Authorizing Provider  ACCU-CHEK FASTCLIX LANCETS MISC 1 Device by Does not apply route 2 (two) times daily. 08/18/16   Nafziger, Darleene, NP  acetaminophen  (TYLENOL ) 325 MG tablet Take 650 mg by mouth every 6 (six) hours as needed.    [provider]  amLODipine  (NORVASC ) 10 MG tablet Take 1 tablet (10 mg total) by mouth daily. 06/18/22     amLODipine  (NORVASC ) 10 MG tablet Take 1 tablet (10 mg total) by mouth daily. 07/29/22     amLODipine  (  NORVASC ) 10 MG tablet Take 1 tablet (10 mg total) by mouth daily. 10/12/23     aspirin  EC 81 MG tablet Take 81 mg by mouth daily.    [provider]  atorvastatin  (LIPITOR) 20 MG tablet Take 20 mg by mouth daily.    [provider]  atorvastatin  (LIPITOR) 20 MG tablet Take 1 tablet (20 mg total) by mouth daily. 06/18/22     atorvastatin  (LIPITOR) 20 MG tablet Take 1 tablet (20 mg total) by mouth daily. 05/23/23     Blood Glucose Monitoring Suppl (ACCU-CHEK GUIDE) w/Device KIT Use to check blood sugar 3 times daily as directed 09/28/23     carvedilol  (COREG ) 12.5 MG tablet  Take 1 tablet (12.5 mg total) by mouth 2 (two) times daily with a meal. Take one tablet daily in the AM. 11/10/16   Nafziger, Darleene, NP  carvedilol  (COREG ) 12.5 MG tablet Take 1 tablet (12.5 mg total) by mouth in the morning and 1 tablet (12.5 mg total) in the evening. Take with meals. 06/18/22     carvedilol  (COREG ) 12.5 MG tablet Take 1 tablet (12.5 mg total) by mouth 2 (two) times daily between meals. 12/05/23     clotrimazole -betamethasone  (LOTRISONE ) cream Apply 1 application topically 2 (two) times daily. 01/14/15   Nafziger, Darleene, NP  dapagliflozin  propanediol (FARXIGA ) 5 MG TABS tablet Take 1 tablet (5 mg total) by mouth daily. 06/18/22     dapagliflozin  propanediol (FARXIGA ) 5 MG TABS tablet Take 1 tablet (5 mg total) by mouth daily. 03/30/23     dapagliflozin  propanediol (FARXIGA ) 5 MG TABS tablet Take 1 tablet (5 mg total) by mouth daily. 09/07/23     doxylamine, Sleep, (UNISOM) 25 MG tablet Take 25 mg by mouth at bedtime as needed for sleep.    [provider]  Dulaglutide  (TRULICITY  [place]) Inject 1.5 mg into the skin once a week.    [provider]  Dulaglutide  (TRULICITY ) 3 MG/0.5ML SOAJ Inject 3 mg into the skin once a week. 08/18/23     Dulaglutide  (TRULICITY ) 3 MG/0.5ML SOAJ Inject 3 mg into the skin every 7 (seven) days. 09/07/23     Emollient (RESTA) CREA Apply topically. Apply to dry skin on residual limbs as directed    Therisa Rush, MD  erythromycin  ophthalmic ointment Apply to eyelid incisions 04/21/22   Luciano Standing, MD  FREESTYLE LITE test strip USE ONE STRIP TO CHECK GLUCOSE TWICE DAILY.    Webb, Padonda B, FNP  gabapentin  (NEURONTIN ) 800 MG tablet Take 1 tablet (800 mg total) by mouth 2 (two) times daily. 09/07/23     glucose blood (ACCU-CHEK GUIDE TEST) test strip Test 2 (two) times a day. Use as instructed 09/29/23     icosapent  Ethyl (VASCEPA ) 1 g capsule Take 2 capsules (2 g total) by mouth 2 (two) times daily. 12/05/23     Insulin  Pen Needle (BD PEN NEEDLE NANO  U/F) 32G X 4 MM MISC Test blood sugars as directed. Patient not taking: Reported on 07/10/2018 05/09/15   Nafziger, Cory, NP  lisinopril  (PRINIVIL ,ZESTRIL ) 2.5 MG tablet Take 2.5 mg by mouth daily.    Therisa Rush, MD  lisinopril  (ZESTRIL ) 40 MG tablet Take 1 tablet (40 mg total) by mouth daily. 06/18/22     lisinopril  (ZESTRIL ) 40 MG tablet Take 1 tablet (40 mg total) by mouth daily. 11/08/23     metFORMIN  (GLUCOPHAGE -XR) 500 MG 24 hr tablet Take 500 mg by mouth 2 (two) times daily.    [provider]  metFORMIN  (GLUCOPHAGE -XR) 500 MG 24 hr tablet Take 2 tablets (1,000 mg total) by mouth 2 (two) times daily. 06/18/22     metFORMIN  (GLUCOPHAGE -XR) 500 MG 24 hr tablet Take 2 tablets (1,000 mg total) by mouth 2 (two) times daily. 08/18/23     Multiple Vitamins-Minerals (ONE-A-DAY MENS 50+ ADVANTAGE) TABS Take 1 tablet by mouth daily.    [provider]  omeprazole  (PRILOSEC) 40 MG capsule Take 1 capsule (40 mg total) by mouth daily. 06/18/22     omeprazole  (PRILOSEC) 40 MG capsule Take 1 capsule (40 mg total) by mouth daily. 05/23/23     predniSONE  (DELTASONE ) 50 MG tablet Take one tablet by mouth once daily for 5 days. 10/19/23   Valdemar Rocky SAUNDERS, NP  tamsulosin  (FLOMAX ) 0.4 MG CAPS capsule Take 0.4 mg by mouth.    [provider]  tamsulosin  (FLOMAX ) 0.4 MG CAPS capsule Take 1 capsule (0.4 mg total) by mouth daily. 06/18/22     tamsulosin  (FLOMAX ) 0.4 MG CAPS capsule Take 1 capsule (0.4 mg total) by mouth daily. 12/05/23     zolpidem  (AMBIEN ) 10 MG tablet Take 10 mg by mouth at bedtime as needed for sleep.    [provider]  zolpidem  (AMBIEN ) 10 MG tablet Take 1 tablet (10 mg total) by mouth at bedtime as needed for sleep. 10/12/23       Physical Exam: BP (!) 182/117 (BP Location: Left Arm)   Pulse (!) 117   Temp 98.6 F (37 C)   Resp 19   Ht 6' (1.829 m)   Wt 84.2 kg   SpO2 99%   BMI 25.18 kg/m  General: Pleasant, well-appearing *** laying in bed. No acute  distress. HEENT: [place]/AT. Anicteric sclera CV: RRR. No murmurs, rubs, or gallops. No LE edema Pulmonary: Lungs CTAB. Normal effort. No wheezing or rales. Abdominal: Soft, nontender, nondistended. Normal bowel sounds. Extremities: Palpable radial and DP pulses. Normal ROM. Skin: Warm and dry. No obvious rash or lesions. Neuro: A&Ox3. Moves all extremities. Normal sensation to light touch. No focal deficit. Psych: Normal mood and affect          Labs on Admission:  Basic Metabolic Panel: Recent Labs  Lab 12/20/23 1446 12/20/23 1528  NA 137 136  K 4.3 4.3  CL 95*  --   CO2 20*  --   GLUCOSE 441*  --   BUN 25*  --   CREATININE 1.45*  --   CALCIUM  10.5*  --    Liver Function Tests: Recent Labs  Lab 12/20/23 1446  AST 32  ALT 35  ALKPHOS 110  BILITOT 0.9  PROT 8.1  ALBUMIN 4.8   No results for input(s): LIPASE, AMYLASE in the last 168 hours. No results for input(s): AMMONIA in the last 168 hours. CBC: Recent Labs  Lab 12/20/23 1446 12/20/23 1528  WBC 19.4*  --   NEUTROABS 16.4*  --   HGB 15.6 15.0  HCT 43.8 44.0  MCV 84.4  --   PLT 405*  --    Cardiac Enzymes: No results for input(s): CKTOTAL, CKMB, CKMBINDEX, TROPONINI in the last 168 hours. BNP (last 3 results) No results for input(s): BNP in the last 8760 hours.  ProBNP (last 3 results) No results for input(s): PROBNP in the last 8760 hours.  CBG: Recent Labs  Lab 12/20/23 1448 12/20/23 1941  GLUCAP 422* 278*    Radiological Exams on Admission: CT ABDOMEN PELVIS W CONTRAST Result Date: 12/20/2023 CLINICAL DATA:  Abdominal  pain. EXAM: CT ABDOMEN AND PELVIS WITH CONTRAST TECHNIQUE: Multidetector CT imaging of the abdomen and pelvis was performed using the standard protocol following bolus administration of intravenous contrast. RADIATION DOSE REDUCTION: This exam was performed according to the departmental dose-optimization program which includes automated exposure control, adjustment  of the mA and/or kV according to patient size and/or use of iterative reconstruction technique. CONTRAST:  100mL OMNIPAQUE IOHEXOL 300 MG/ML  SOLN COMPARISON:  None Available. FINDINGS: Lower chest: The visualized lung bases are clear. No intra-abdominal free air or free fluid. Hepatobiliary: Fatty liver. No biliary ductal dilatation. Tiny gallstone. No pericholecystic fluid or evidence of acute cholecystitis by CT. Pancreas: Unremarkable. No pancreatic ductal dilatation or surrounding inflammatory changes. Spleen: Normal in size without focal abnormality. Adrenals/Urinary Tract: The adrenal glands, kidneys, visualized ureters, and urinary bladder appear unremarkable. Stomach/Bowel: There is postsurgical changes of the bowel with ileocolic anastomosis in the right lower quadrant. There is no bowel obstruction or active inflammation. Appendectomy. Vascular/Lymphatic: Moderate aortoiliac atherosclerotic disease. The IVC is unremarkable. No portal venous gas. There is no adenopathy. Reproductive: The prostate and seminal vesicles are grossly remarkable no pelvic mass. Other: Small fat containing left inguinal and umbilical hernia. Musculoskeletal: Degenerative changes of spine. No acute osseous pathology. IMPRESSION: 1. No acute intra-abdominal or pelvic pathology. 2. Fatty liver. 3. Cholelithiasis. 4. Postsurgical changes of the bowel with ileocolic anastomosis in the right lower quadrant. No bowel obstruction. 5.  Aortic Atherosclerosis (ICD10-I70.0). Electronically Signed   By: Vanetta Chou M.D.   On: 12/20/2023 17:54   DG Chest Port 1 View Result Date: 12/20/2023 CLINICAL DATA:  Shortness of breath, projectile vomiting for 2 days, diabetes EXAM: PORTABLE CHEST 1 VIEW COMPARISON:  09/17/2018 FINDINGS: The heart size and mediastinal contours are within normal limits. Both lungs are clear. The visualized skeletal structures are unremarkable. IMPRESSION: No active disease. Electronically Signed   By: Ozell Daring M.D.   On: 12/20/2023 16:43   Assessment/Plan Dustin Lozano is a 63 y.o. male with medical history significant for chronic systolic heart failure, T2DM, HTN, bilateral BKA, CAD, GERD, HLD and diabetic neuropathy who presented to [facility] for evaluation of abdominal pain, nausea and vomiting    # Severe hyperglycemia # Uncontrolled type 2 diabetes - - -  # AKI # AGMA - - - #***  #***  #***  #***  #***  DVT prophylaxis: Lovenox      Code Status: Prior  Consults called: None  Family Communication: ***  Severity of Illness: The appropriate patient status for this patient is INPATIENT. Inpatient status is judged to be reasonable and necessary in order to provide the required intensity of service to ensure the patient's safety. The patient's presenting symptoms, physical exam findings, and initial radiographic and laboratory data in the context of their chronic comorbidities is felt to place them at high risk for further clinical deterioration. Furthermore, it is not anticipated that the patient will be medically stable for discharge from the hospital within 2 midnights of admission.   * I certify that at the point of admission it is my clinical judgment that the patient will require inpatient hospital care spanning beyond 2 midnights from the point of admission due to high intensity of service, high risk for further deterioration and high frequency of surveillance required.*  Level of care: Telemetry    Lou Claretta HERO, MD 12/20/2023, 9:27 PM [facility] Pager: [phone] Isaiah 41:10   If 7PM-7AM, please contact night-coverage [email or web address] Password [email or web address]

## 2023-12-20 NOTE — ED Triage Notes (Signed)
 Patient reports 2 days of projectile vomiting, says that he believes it is from post nasal drip. Says he is unable to keep down his medications or drink. He is diabetic.

## 2023-12-21 ENCOUNTER — Encounter (HOSPITAL_COMMUNITY): Payer: Self-pay | Admitting: Family Medicine

## 2023-12-21 DIAGNOSIS — E8729 Other acidosis: Secondary | ICD-10-CM

## 2023-12-21 DIAGNOSIS — I160 Hypertensive urgency: Secondary | ICD-10-CM

## 2023-12-21 DIAGNOSIS — R112 Nausea with vomiting, unspecified: Secondary | ICD-10-CM

## 2023-12-21 DIAGNOSIS — N179 Acute kidney failure, unspecified: Secondary | ICD-10-CM

## 2023-12-21 DIAGNOSIS — E1165 Type 2 diabetes mellitus with hyperglycemia: Secondary | ICD-10-CM

## 2023-12-21 DIAGNOSIS — E872 Acidosis, unspecified: Secondary | ICD-10-CM

## 2023-12-21 DIAGNOSIS — R1013 Epigastric pain: Secondary | ICD-10-CM

## 2023-12-21 LAB — CBC
HCT: 39.9 % (ref 39.0–52.0)
Hemoglobin: 13.7 g/dL (ref 13.0–17.0)
MCH: 29.6 pg (ref 26.0–34.0)
MCHC: 34.3 g/dL (ref 30.0–36.0)
MCV: 86.2 fL (ref 80.0–100.0)
Platelets: 312 K/uL (ref 150–400)
RBC: 4.63 MIL/uL (ref 4.22–5.81)
RDW: 13.2 % (ref 11.5–15.5)
WBC: 16.6 K/uL — ABNORMAL HIGH (ref 4.0–10.5)
nRBC: 0 % (ref 0.0–0.2)

## 2023-12-21 LAB — LACTIC ACID, PLASMA
Lactic Acid, Venous: 1.8 mmol/L (ref 0.5–1.9)
Lactic Acid, Venous: 3.2 mmol/L (ref 0.5–1.9)
Lactic Acid, Venous: 1.7 mmol/L (ref 0.5–1.9)

## 2023-12-21 LAB — BASIC METABOLIC PANEL WITH GFR
Anion gap: 14 (ref 5–15)
BUN: 23 mg/dL (ref 8–23)
CO2: 23 mmol/L (ref 22–32)
Calcium: 9.3 mg/dL (ref 8.9–10.3)
Chloride: 100 mmol/L (ref 98–111)
Creatinine, Ser: 1.11 mg/dL (ref 0.61–1.24)
GFR, Estimated: >60 mL/min (ref >60)
Glucose, Bld: 281 mg/dL — ABNORMAL HIGH (ref 70–99)
Potassium: 3.9 mmol/L (ref 3.5–5.1)
Sodium: 137 mmol/L (ref 135–145)

## 2023-12-21 LAB — GLUCOSE, CAPILLARY
Glucose-Capillary: 154 mg/dL — ABNORMAL HIGH (ref 70–99)
Glucose-Capillary: 165 mg/dL — ABNORMAL HIGH (ref 70–99)
Glucose-Capillary: 178 mg/dL — ABNORMAL HIGH (ref 70–99)
Glucose-Capillary: 197 mg/dL — ABNORMAL HIGH (ref 70–99)
Glucose-Capillary: 200 mg/dL — ABNORMAL HIGH (ref 70–99)
Glucose-Capillary: 273 mg/dL — ABNORMAL HIGH (ref 70–99)

## 2023-12-21 LAB — HIV ANTIBODY (ROUTINE TESTING W REFLEX): HIV Screen 4th Generation wRfx: NONREACTIVE

## 2023-12-21 LAB — HEMOGLOBIN A1C
Hgb A1c MFr Bld: 8.2 % — ABNORMAL HIGH (ref 4.8–5.6)
Mean Plasma Glucose: 188.64 mg/dL

## 2023-12-21 MED ORDER — ORAL CARE MOUTH RINSE: 15.0000 mL | OROMUCOSAL | Status: DC | PRN

## 2023-12-21 MED ORDER — PANTOPRAZOLE SODIUM 40 MG IV SOLR
40.0000 mg | Freq: Two times a day (BID) | INTRAVENOUS | Status: DC
  Administered 2023-12-21 – 2023-12-23 (×4): 40 mg via INTRAVENOUS
  Filled 2023-12-21 (×4): qty 10

## 2023-12-21 MED ORDER — SODIUM CHLORIDE 0.9 % IV SOLN
12.5000 mg | Freq: Once | INTRAVENOUS | Status: AC
  Administered 2023-12-21: 12.5 mg via INTRAVENOUS
  Filled 2023-12-21: qty 12.5

## 2023-12-21 MED ORDER — ONDANSETRON HCL 4 MG/2ML IJ SOLN
4.0000 mg | Freq: Four times a day (QID) | INTRAMUSCULAR | Status: DC
  Administered 2023-12-21 – 2023-12-23 (×9): 4 mg via INTRAVENOUS
  Filled 2023-12-21 (×10): qty 2

## 2023-12-21 MED ORDER — HYDROMORPHONE HCL 1 MG/ML IJ SOLN
0.5000 mg | Freq: Four times a day (QID) | INTRAMUSCULAR | Status: DC | PRN
Start: 2023-12-21 — End: 2023-12-23
  Administered 2023-12-21: 0.5 mg via INTRAVENOUS
  Filled 2023-12-21: qty 0.5

## 2023-12-21 MED ORDER — AMLODIPINE BESYLATE 10 MG PO TABS
10.0000 mg | ORAL_TABLET | Freq: Every day | ORAL | Status: DC
  Administered 2023-12-21 – 2023-12-23 (×3): 10 mg via ORAL
  Filled 2023-12-21 (×3): qty 1

## 2023-12-21 MED ORDER — LABETALOL HCL 5 MG/ML IV SOLN
10.0000 mg | Freq: Once | INTRAVENOUS | Status: AC
  Administered 2023-12-21: 10 mg via INTRAVENOUS
  Filled 2023-12-21: qty 4

## 2023-12-21 MED ORDER — LACTATED RINGERS IV BOLUS
500.0000 mL | Freq: Once | INTRAVENOUS | Status: AC
  Administered 2023-12-21: 500 mL via INTRAVENOUS

## 2023-12-21 MED ORDER — CARVEDILOL 12.5 MG PO TABS
12.5000 mg | ORAL_TABLET | Freq: Two times a day (BID) | ORAL | Status: DC
  Administered 2023-12-21 – 2023-12-23 (×4): 12.5 mg via ORAL
  Filled 2023-12-21 (×4): qty 1

## 2023-12-21 MED ORDER — LACTATED RINGERS IV SOLN: INTRAVENOUS | Status: DC

## 2023-12-21 MED ORDER — SODIUM CHLORIDE 0.9 % IV SOLN
12.5000 mg | Freq: Four times a day (QID) | INTRAVENOUS | Status: DC | PRN
  Administered 2023-12-21 – 2023-12-23 (×8): 12.5 mg via INTRAVENOUS
  Filled 2023-12-21 (×6): qty 12.5
  Filled 2023-12-21: qty 0.5
  Filled 2023-12-21 (×2): qty 12.5

## 2023-12-21 NOTE — Progress Notes (Signed)
 PROGRESS NOTE    Dustin Lozano  FMW:[id number] DOB: 11-24-1960 DOA: 12/20/2023 PCP: Emerick Avelina POUR, PA-C   Brief Narrative:  63 y.o. male with medical history significant for chronic systolic heart failure, uncontrolled T2DM not on insulin , HTN, bilateral BKA, CAD, GERD, HLD and diabetic neuropathy presented with abdominal pain, nausea and vomiting.  On presentation, patient was hypoglycemic with bicarb of 20, creatinine of 1.45, anion gap of 21, WBC 19.4 with elevated lactic acid of 4.7 and 3.6 and UA not suggestive of infection.  Chest x-ray showed no active disease.  CT of abdomen and pelvis showed fatty liver and cholelithiasis but no acute intra-abdominal pathology.  He was started on IV fluids and antiemetics.  Assessment & Plan:   Diabetes type 2 with severe hyperglycemia and early DKA - Presented with abdominal pain, nausea and vomiting with mild acidosis and elevated anion gap and hypoglycemia. -Blood sugars improving.  Continue CBGs with SSI.  Follow A1c.  Consult diabetes coordinator  Intractable nausea and vomiting with epigastric pain -Possible cause.  Imaging as above.  Continue IV fluids and antiemetics with analgesics as needed.  Continue IV Protonix - Advance diet as tolerated  AKI Acute metabolic acidosis - Possible prerenal from above.  Creatinine 1.45 on admission.  Improved with IV fluids.  Monitor  Lactic acidosis - Resolved.  No evidence of infection  Leukocytosis -Improving  Thrombocytosis -Resolved  Hypertensive urgency - Blood pressure still elevated intermittently.  Continue IV hydralazine  as needed.  Resume oral antihypertensives with improvement in nausea and vomiting  GERD -continue IV Protonix  Chronic systolic CHF - Strict input and output.  Daily weights.  Showed no signs of compensation currently.  Outpatient follow-up with cardiology  CAD Hyperlipidemia -Resume home meds once able to tolerate oral  DVT prophylaxis: Lovenox  Code  Status: Full Family Communication: None at bedside Disposition Plan: Status is: Inpatient Remains inpatient appropriate because: Of severity of illness    Consultants: None  Procedures: None  Antimicrobials: None   Subjective: Patient seen and examined at bedside.  Does not feel well.  Continues to have intermittent abdominal pain with nausea.  No chest pain or shortness of breath reported.  Objective: Vitals:   12/21/23 0005 12/21/23 0118 12/21/23 0442 12/21/23 0637  BP: (!) 190/96 (!) 158/84 (!) 187/98 (!) 181/95  Pulse: (!) 104 100 (!) 107 (!) 101  Resp: (!) 24 20 19    Temp: 98.2 F (36.8 C)  97.8 F (36.6 C)   TempSrc: Oral  Oral   SpO2: 98% 96% 99%   Weight:      Height:        Intake/Output Summary (Last 24 hours) at 12/21/2023 0802 Last data filed at 12/21/2023 9361 Gross per 24 hour  Intake 2197.48 ml  Output 950 ml  Net 1247.48 ml   Filed Weights   12/20/23 2055  Weight: 84.2 kg    Examination:  General exam: Appears calm and comfortable  Respiratory system: Bilateral decreased breath sounds at bases Cardiovascular system: S1 & S2 heard, tachycardic Gastrointestinal system: Abdomen is nondistended, soft and mild tenderness in the epigastric region.  Normal bowel sounds heard. Extremities: Bilateral BKA present Central nervous system: Alert and oriented. No focal neurological deficits. Moving extremities Skin: No rashes, lesions or ulcers Psychiatry: Flat affect.  Not agitated.    Data Reviewed: I have personally reviewed following labs and imaging studies  CBC: Recent Labs  Lab 12/20/23 1446 12/20/23 1528 12/21/23 0047  WBC 19.4*  --  16.6*  NEUTROABS 16.4*  --   --   HGB 15.6 15.0 13.7  HCT 43.8 44.0 39.9  MCV 84.4  --  86.2  PLT 405*  --  312   Basic Metabolic Panel: Recent Labs  Lab 12/20/23 1446 12/20/23 1528 12/21/23 0047  NA 137 136 137  K 4.3 4.3 3.9  CL 95*  --  100  CO2 20*  --  23  GLUCOSE 441*  --  281*  BUN 25*   --  23  CREATININE 1.45*  --  1.11  CALCIUM  10.5*  --  9.3   GFR: Estimated Creatinine Clearance: 76.7 mL/min (by C-G formula based on SCr of 1.11 mg/dL). Liver Function Tests: Recent Labs  Lab 12/20/23 1446  AST 32  ALT 35  ALKPHOS 110  BILITOT 0.9  PROT 8.1  ALBUMIN 4.8   No results for input(s): LIPASE, AMYLASE in the last 168 hours. No results for input(s): AMMONIA in the last 168 hours. Coagulation Profile: Recent Labs  Lab 12/20/23 1446  INR 0.9   Cardiac Enzymes: No results for input(s): CKTOTAL, CKMB, CKMBINDEX, TROPONINI in the last 168 hours. BNP (last 3 results) No results for input(s): PROBNP in the last 8760 hours. HbA1C: Recent Labs    12/21/23 0047  HGBA1C 8.2*   CBG: Recent Labs  Lab 12/20/23 1448 12/20/23 1941 12/20/23 2148 12/21/23 0002 12/21/23 0433  GLUCAP 422* 278* 284* 273* 200*   Lipid Profile: No results for input(s): CHOL, HDL, LDLCALC, TRIG, CHOLHDL, LDLDIRECT in the last 72 hours. Thyroid Function Tests: No results for input(s): TSH, T4TOTAL, FREET4, T3FREE, THYROIDAB in the last 72 hours. Anemia Panel: No results for input(s): VITAMINB12, FOLATE, FERRITIN, TIBC, IRON, RETICCTPCT in the last 72 hours. Sepsis Labs: Recent Labs  Lab 12/20/23 1930 12/21/23 0047 12/21/23 0314 12/21/23 0536  LATICACIDVEN 3.6* 1.8 3.2* 1.7    Recent Results (from the past 240 hours)  Culture, blood (Routine x 2)     Status: None (Preliminary result)   Collection Time: 12/20/23  1:35 PM   Specimen: BLOOD RIGHT WRIST  Result Value Ref Range Status   Specimen Description   Final    BLOOD RIGHT WRIST Performed at [facility], [address], [place], [place] [address]    Special Requests   Final    BOTTLES DRAWN AEROBIC AND ANAEROBIC Blood Culture adequate volume Performed at [facility], [address] [place], [place] [address]    Culture   Final    NO GROWTH <  12 HOURS Performed at [facility], [address], [place], [place] [address]    Report Status PENDING  Incomplete  Culture, blood (Routine x 2)     Status: None (Preliminary result)   Collection Time: 12/20/23  2:46 PM   Specimen: BLOOD  Result Value Ref Range Status   Specimen Description   Final    BLOOD LEFT ANTECUBITAL Performed at [facility], [address] [place], [place] [address]    Special Requests   Final    Blood Culture results may not be optimal due to an inadequate volume of blood received in culture bottles BOTTLES DRAWN AEROBIC AND ANAEROBIC Performed at [facility], [address], [place], [place] [address]    Culture   Final    NO GROWTH < 12 HOURS Performed at [facility], [address] [place], [place] [address]    Report Status PENDING  Incomplete  Resp panel by RT-PCR (  RSV, Flu A&B, Covid) Anterior Nasal Swab     Status: None   Collection Time: 12/20/23  2:47 PM   Specimen: Anterior Nasal Swab  Result Value Ref Range Status   SARS Coronavirus 2 by RT PCR NEGATIVE NEGATIVE Final    Comment: (NOTE) SARS-CoV-2 target nucleic acids are NOT DETECTED.  The SARS-CoV-2 RNA is generally detectable in upper respiratory specimens during the acute phase of infection. The lowest concentration of SARS-CoV-2 viral copies this assay can detect is 138 copies/mL. A negative result does not preclude SARS-Cov-2 infection and should not be used as the sole basis for treatment or other patient management decisions. A negative result may occur with  improper specimen collection/handling, submission of specimen other than nasopharyngeal swab, presence of viral mutation(s) within the areas targeted by this assay, and inadequate number of viral copies(<138 copies/mL). A negative result must be combined with clinical observations, patient history, and epidemiological information. The expected result is Negative.  Fact  Sheet for Patients:  bloggercourse.com  Fact Sheet for Healthcare Providers:  seriousbroker.it  This test is no t yet approved or cleared by the [place]  FDA and  has been authorized for detection and/or diagnosis of SARS-CoV-2 by FDA under an Emergency Use Authorization (EUA). This EUA will remain  in effect (meaning this test can be used) for the duration of the COVID-19 declaration under Section 564(b)(1) of the Act, 21 U.S.C.section 360bbb-3(b)(1), unless the authorization is terminated  or revoked sooner.       Influenza A by PCR NEGATIVE NEGATIVE Final   Influenza B by PCR NEGATIVE NEGATIVE Final    Comment: (NOTE) The Xpert Xpress SARS-CoV-2/FLU/RSV plus assay is intended as an aid in the diagnosis of influenza from Nasopharyngeal swab specimens and should not be used as a sole basis for treatment. Nasal washings and aspirates are unacceptable for Xpert Xpress SARS-CoV-2/FLU/RSV testing.  Fact Sheet for Patients: bloggercourse.com  Fact Sheet for Healthcare Providers: seriousbroker.it  This test is not yet approved or cleared by the [place]  FDA and has been authorized for detection and/or diagnosis of SARS-CoV-2 by FDA under an Emergency Use Authorization (EUA). This EUA will remain in effect (meaning this test can be used) for the duration of the COVID-19 declaration under Section 564(b)(1) of the Act, 21 U.S.C. section 360bbb-3(b)(1), unless the authorization is terminated or revoked.     Resp Syncytial Virus by PCR NEGATIVE NEGATIVE Final    Comment: (NOTE) Fact Sheet for Patients: bloggercourse.com  Fact Sheet for Healthcare Providers: seriousbroker.it  This test is not yet approved or cleared by the [place]  FDA and has been authorized for detection and/or diagnosis of SARS-CoV-2 by FDA under an  Emergency Use Authorization (EUA). This EUA will remain in effect (meaning this test can be used) for the duration of the COVID-19 declaration under Section 564(b)(1) of the Act, 21 U.S.C. section 360bbb-3(b)(1), unless the authorization is terminated or revoked.  Performed at [facility], [address], [place], [place] [address]          Radiology Studies: CT ABDOMEN PELVIS W CONTRAST Result Date: 12/20/2023 CLINICAL DATA:  Abdominal pain. EXAM: CT ABDOMEN AND PELVIS WITH CONTRAST TECHNIQUE: Multidetector CT imaging of the abdomen and pelvis was performed using the standard protocol following bolus administration of intravenous contrast. RADIATION DOSE REDUCTION: This exam was performed according to the departmental dose-optimization program which includes automated exposure control, adjustment of the mA and/or kV according to patient size and/or use of iterative reconstruction technique.  CONTRAST:  100mL OMNIPAQUE IOHEXOL 300 MG/ML  SOLN COMPARISON:  None Available. FINDINGS: Lower chest: The visualized lung bases are clear. No intra-abdominal free air or free fluid. Hepatobiliary: Fatty liver. No biliary ductal dilatation. Tiny gallstone. No pericholecystic fluid or evidence of acute cholecystitis by CT. Pancreas: Unremarkable. No pancreatic ductal dilatation or surrounding inflammatory changes. Spleen: Normal in size without focal abnormality. Adrenals/Urinary Tract: The adrenal glands, kidneys, visualized ureters, and urinary bladder appear unremarkable. Stomach/Bowel: There is postsurgical changes of the bowel with ileocolic anastomosis in the right lower quadrant. There is no bowel obstruction or active inflammation. Appendectomy. Vascular/Lymphatic: Moderate aortoiliac atherosclerotic disease. The IVC is unremarkable. No portal venous gas. There is no adenopathy. Reproductive: The prostate and seminal vesicles are grossly remarkable no pelvic mass. Other: Small fat  containing left inguinal and umbilical hernia. Musculoskeletal: Degenerative changes of spine. No acute osseous pathology. IMPRESSION: 1. No acute intra-abdominal or pelvic pathology. 2. Fatty liver. 3. Cholelithiasis. 4. Postsurgical changes of the bowel with ileocolic anastomosis in the right lower quadrant. No bowel obstruction. 5.  Aortic Atherosclerosis (ICD10-I70.0). Electronically Signed   By: Vanetta Chou M.D.   On: 12/20/2023 17:54   DG Chest Port 1 View Result Date: 12/20/2023 CLINICAL DATA:  Shortness of breath, projectile vomiting for 2 days, diabetes EXAM: PORTABLE CHEST 1 VIEW COMPARISON:  09/17/2018 FINDINGS: The heart size and mediastinal contours are within normal limits. Both lungs are clear. The visualized skeletal structures are unremarkable. IMPRESSION: No active disease. Electronically Signed   By: Ozell Daring M.D.   On: 12/20/2023 16:43        Scheduled Meds:  enoxaparin  (LOVENOX ) injection  40 mg Subcutaneous Q24H   insulin  aspart  0-15 Units Subcutaneous Q4H   pantoprazole (PROTONIX) IV  40 mg Intravenous Daily   Continuous Infusions:  lactated ringers  150 mL/hr at 12/21/23 0500   promethazine (PHENERGAN) injection (IM or IVPB)            Sophie Mao, MD [facility] 12/21/2023, 8:02 AM

## 2023-12-21 NOTE — TOC Initial Note (Signed)
 Transition of Care [facility]) - Initial/Assessment Note    Patient Details  Name: Dustin Lozano MRN: [id number] Date of Birth: [birth year]  Transition of Care [facility] CM/SW Contact:    Bascom Service, RN Phone Number: 12/21/2023, 3:20 PM  Clinical Narrative:d/c plan home. Bilat BKA-indpe @ home. Has family support. Has own transport home.                   Expected Discharge Plan: Home/Self Care Barriers to Discharge: Continued Medical Work up   Patient Goals and CMS Choice Patient states their goals for this hospitalization and ongoing recovery are:: Home CMS Medicare.gov Compare Post Acute Care list provided to:: Patient Represenative (must comment) Choice offered to / list presented to : Sibling [place] ownership interest in [facility].provided to:: Sibling    Expected Discharge Plan and Services   Discharge Planning Services: CM Consult Post Acute Care Choice: Resumption of Svcs/PTA Provider Living arrangements for the past 2 months: Single Family Home                                      Prior Living Arrangements/Services Living arrangements for the past 2 months: Single Family Home Lives with:: Self              Current home services: DME (w/c)    Activities of Daily Living   ADL Screening (condition at time of admission) Independently performs ADLs?: Yes (appropriate for developmental age) Is the patient deaf or have difficulty hearing?: No Does the patient have difficulty seeing, even when wearing glasses/contacts?: No Does the patient have difficulty concentrating, remembering, or making decisions?: No  Permission Sought/Granted                  Emotional Assessment              Admission diagnosis:  Lactic acidosis [E87.20] Hyperglycemia [R73.9] AKI (acute kidney injury) [N17.9] Uncontrolled type 2 diabetes mellitus with hyperglycemia (HCC) [E11.65] Sepsis without acute organ dysfunction, due to unspecified organism [facility])  [A41.9] Patient Active Problem List   Diagnosis Date Noted   Severe hyperglycemia due to diabetes mellitus (HCC) 12/21/2023   AKI (acute kidney injury) 12/21/2023   Lactic acidosis 12/21/2023   Intractable nausea and vomiting 12/21/2023   Epigastric pain 12/21/2023   High anion gap metabolic acidosis 12/21/2023   Hypertensive urgency 12/21/2023   Uncontrolled type 2 diabetes mellitus with hyperglycemia, without long-term current use of insulin  (HCC) 12/21/2023   Uncontrolled type 2 diabetes mellitus with hyperglycemia (HCC) 12/20/2023   S/P bilateral below knee amputation (HCC) 04/26/2018   Diabetic neuropathy (HCC) 10/22/2015   Chronic systolic heart failure (HCC) 08/24/2011   HTN (hypertension), malignant 08/14/2011   Diabetes mellitus type 2, noninsulin dependent (HCC) 08/14/2011   PCP:  Emerick Avelina POUR, PA-C Pharmacy:   [facility] [address] [place] [address] Phone: [phone] Fax: [phone]     Social Drivers of Health (SDOH) Social History: SDOH Screenings   Food Insecurity: No Food Insecurity (12/20/2023)  Housing: Low Risk  (12/20/2023)  Transportation Needs: No Transportation Needs (12/20/2023)  Utilities: Not At Risk (12/20/2023)  Tobacco Use: Medium Risk (12/20/2023)   SDOH Interventions:     Readmission Risk Interventions     No data to display

## 2023-12-22 DIAGNOSIS — E1165 Type 2 diabetes mellitus with hyperglycemia: Secondary | ICD-10-CM | POA: Diagnosis not present

## 2023-12-22 LAB — CBC WITH DIFFERENTIAL/PLATELET
Abs Immature Granulocytes: 0.03 K/uL (ref 0.00–0.07)
Basophils Absolute: 0 K/uL (ref 0.0–0.1)
Basophils Relative: 0 %
Eosinophils Absolute: 0.1 K/uL (ref 0.0–0.5)
Eosinophils Relative: 1 %
HCT: 38.6 % — ABNORMAL LOW (ref 39.0–52.0)
Hemoglobin: 13.3 g/dL (ref 13.0–17.0)
Immature Granulocytes: 0 %
Lymphocytes Relative: 15 %
Lymphs Abs: 1.5 K/uL (ref 0.7–4.0)
MCH: 29.2 pg (ref 26.0–34.0)
MCHC: 34.5 g/dL (ref 30.0–36.0)
MCV: 84.8 fL (ref 80.0–100.0)
Monocytes Absolute: 1.1 K/uL — ABNORMAL HIGH (ref 0.1–1.0)
Monocytes Relative: 12 %
Neutro Abs: 6.8 K/uL (ref 1.7–7.7)
Neutrophils Relative %: 72 %
Platelets: 255 K/uL (ref 150–400)
RBC: 4.55 MIL/uL (ref 4.22–5.81)
RDW: 12.4 % (ref 11.5–15.5)
WBC: 9.6 K/uL (ref 4.0–10.5)
nRBC: 0 % (ref 0.0–0.2)

## 2023-12-22 LAB — COMPREHENSIVE METABOLIC PANEL WITH GFR
ALT: 41 U/L (ref 0–44)
AST: 76 U/L — ABNORMAL HIGH (ref 15–41)
Albumin: 3.7 g/dL (ref 3.5–5.0)
Alkaline Phosphatase: 81 U/L (ref 38–126)
Anion gap: 10 (ref 5–15)
BUN: 18 mg/dL (ref 8–23)
CO2: 25 mmol/L (ref 22–32)
Calcium: 8.8 mg/dL — ABNORMAL LOW (ref 8.9–10.3)
Chloride: 101 mmol/L (ref 98–111)
Creatinine, Ser: 0.98 mg/dL (ref 0.61–1.24)
GFR, Estimated: >60 mL/min (ref >60)
Glucose, Bld: 157 mg/dL — ABNORMAL HIGH (ref 70–99)
Potassium: 3.6 mmol/L (ref 3.5–5.1)
Sodium: 136 mmol/L (ref 135–145)
Total Bilirubin: 0.8 mg/dL (ref 0.0–1.2)
Total Protein: 6.2 g/dL — ABNORMAL LOW (ref 6.5–8.1)

## 2023-12-22 LAB — GLUCOSE, CAPILLARY
Glucose-Capillary: 137 mg/dL — ABNORMAL HIGH (ref 70–99)
Glucose-Capillary: 153 mg/dL — ABNORMAL HIGH (ref 70–99)
Glucose-Capillary: 162 mg/dL — ABNORMAL HIGH (ref 70–99)
Glucose-Capillary: 168 mg/dL — ABNORMAL HIGH (ref 70–99)
Glucose-Capillary: 179 mg/dL — ABNORMAL HIGH (ref 70–99)
Glucose-Capillary: 188 mg/dL — ABNORMAL HIGH (ref 70–99)

## 2023-12-22 LAB — MAGNESIUM: Magnesium: 1.7 mg/dL (ref 1.7–2.4)

## 2023-12-22 MED ORDER — ALUM & MAG HYDROXIDE-SIMETH 200-200-20 MG/5ML PO SUSP: 15.0000 mL | ORAL | Status: DC | PRN

## 2023-12-22 MED ORDER — ZOLPIDEM TARTRATE 5 MG PO TABS
5.0000 mg | ORAL_TABLET | Freq: Every evening | ORAL | Status: DC | PRN
  Administered 2023-12-22: 5 mg via ORAL
  Filled 2023-12-22: qty 1

## 2023-12-22 NOTE — Plan of Care (Signed)

## 2023-12-22 NOTE — Inpatient Diabetes Management (Addendum)
 Inpatient Diabetes Program Recommendations  AACE/ADA: New Consensus Statement on Inpatient Glycemic Control (2015)  Target Ranges:  Prepandial:   less than 140 mg/dL      Peak postprandial:   less than 180 mg/dL (1-2 hours)      Critically ill patients:  140 - 180 mg/dL   Lab Results  Component Value Date   GLUCAP 188 (H) 12/22/2023   HGBA1C 8.2 (H) 12/21/2023    Review of Glycemic Control  Diabetes history: DM2 Outpatient Diabetes medications: Farxiga  5 mg daily, Trulicity  3 mg weekly, metformin  1000 mg in am Current orders for Inpatient glycemic control: Novolog  0-15 Q4H  HgbA1C - 8.2% CBGs168, 153, 188 today. Pt still nauseated.  Inpatient Diabetes Program Recommendations:    Spoke with pt at bedside regarding his diabetes and HgbA1C of 8.2%. Pt states he was a bad boy and ate a lot of Tootsie Pops in the weeks before coming to the hospital. Was not paying attention to his diabetes and not monitoring as often as he previously was. States HgbA1C is usually < 7%.  Asked if pt interested in CGM and he answered no, I don't mind sticking my finger. Discussed importance of checking CBGs and maintaining good CBG control to prevent long-term and short-term complications. Explained how hyperglycemia leads to damage within blood vessels which lead to the common complications seen with uncontrolled diabetes. Stressed to the patient the importance of improving glycemic control to prevent further complications from uncontrolled diabetes. Discussed impact of nutrition, exercise, stress, sickness, and medications on diabetes control. Pt prefers to stay on same diabetes home meds when discharging.  Appreciative of visit.  Thank you. Shona Brandy, RD, LDN, CDCES Inpatient Diabetes Coordinator [phone]

## 2023-12-22 NOTE — Progress Notes (Signed)
 PROGRESS NOTE    Dustin Lozano  FMW:[id number] DOB: 25-Jun-1960 DOA: 12/20/2023 PCP: Emerick Avelina POUR, PA-C   Brief Narrative:  63 y.o. male with medical history significant for chronic systolic heart failure, uncontrolled T2DM not on insulin , HTN, bilateral BKA, CAD, GERD, HLD and diabetic neuropathy presented with abdominal pain, nausea and vomiting.  On presentation, patient was hypoglycemic with bicarb of 20, creatinine of 1.45, anion gap of 21, WBC 19.4 with elevated lactic acid of 4.7 and 3.6 and UA not suggestive of infection.  Chest x-ray showed no active disease.  CT of abdomen and pelvis showed fatty liver and cholelithiasis but no acute intra-abdominal pathology.  He was started on IV fluids and antiemetics.  Assessment & Plan:   Diabetes type 2 with severe hyperglycemia and early DKA - Presented with abdominal pain, nausea and vomiting with mild acidosis and elevated anion gap and hypoglycemia. -Blood sugars improving.  Continue CBGs with SSI.  A1c 8.2.  diabetes coordinator consult pending.  Intractable nausea and vomiting with epigastric pain - Questionable cause.  Imaging as above.  Continue IV fluids and antiemetics with analgesics as needed.  Continue IV Protonix - Advance diet as tolerated.  Patient continues to have dry heaving and does not feel well and does not feel ready to go home today.  AKI Acute metabolic acidosis - Possible prerenal from above.  Creatinine 1.45 on admission.  Improved with IV fluids.  Monitor  Lactic acidosis - Resolved.  No evidence of infection  Leukocytosis -Resolved  Thrombocytosis -Resolved  Hypertensive urgency - Blood pressure still elevated intermittently.  Continue IV hydralazine  as needed.  Resume oral antihypertensives   GERD -continue IV Protonix  Chronic systolic CHF - Strict input and output.  Daily weights.  Showed no signs of compensation currently.  Outpatient follow-up with  cardiology  CAD Hyperlipidemia -Resume home meds once able to tolerate oral  DVT prophylaxis: Lovenox  Code Status: Full Family Communication: None at bedside Disposition Plan: Status is: Inpatient Remains inpatient appropriate because: Of severity of illness    Consultants: None  Procedures: None  Antimicrobials: None   Subjective: Patient seen and examined at bedside.  Still feels poorly with continued dry heaving.  Does not feel ready to go home today.  Denies worsening abdominal pain, fever or shortness of breath. Objective: Vitals:   12/21/23 1933 12/22/23 0007 12/22/23 0411 12/22/23 0824  BP: (!) 166/93 (!) 176/95 (!) 174/90 (!) 172/97  Pulse: 96 91 99 94  Resp: 15   16  Temp: 98.3 F (36.8 C) 97.8 F (36.6 C) 98.3 F (36.8 C)   TempSrc: Oral Oral Oral   SpO2: 96% 98% 95% 100%  Weight:      Height:        Intake/Output Summary (Last 24 hours) at 12/22/2023 0936 Last data filed at 12/22/2023 0700 Gross per 24 hour  Intake 1743.57 ml  Output 2650 ml  Net -906.43 ml   Filed Weights   12/20/23 2055  Weight: 84.2 kg    Examination:  General: On room air.  No distress ENT/neck: No thyromegaly.  JVD is not elevated  respiratory: Decreased breath sounds at bases bilaterally with some crackles; no wheezing  CVS: S1-S2 heard, rate controlled currently Abdominal: Soft, mildly tender in the epigastric region, slightly distended; no organomegaly, bowel sounds are heard Extremities: Bilateral BKA CNS: Awake and alert.  No focal neurologic deficit.  Moves extremities Lymph: No obvious lymphadenopathy Skin: No obvious ecchymosis/lesions  psych: Affect is mostly flat.  Not agitated currently.   Musculoskeletal: No obvious other joint swelling/deformity     Data Reviewed: I have personally reviewed following labs and imaging studies  CBC: Recent Labs  Lab 12/20/23 1446 12/20/23 1528 12/21/23 0047 12/22/23 0534  WBC 19.4*  --  16.6* 9.6  NEUTROABS  16.4*  --   --  6.8  HGB 15.6 15.0 13.7 13.3  HCT 43.8 44.0 39.9 38.6*  MCV 84.4  --  86.2 84.8  PLT 405*  --  312 255   Basic Metabolic Panel: Recent Labs  Lab 12/20/23 1446 12/20/23 1528 12/21/23 0047 12/22/23 0534  NA 137 136 137 136  K 4.3 4.3 3.9 3.6  CL 95*  --  100 101  CO2 20*  --  23 25  GLUCOSE 441*  --  281* 157*  BUN 25*  --  23 18  CREATININE 1.45*  --  1.11 0.98  CALCIUM  10.5*  --  9.3 8.8*  MG  --   --   --  1.7   GFR: Estimated Creatinine Clearance: 86.9 mL/min (by C-G formula based on SCr of 0.98 mg/dL). Liver Function Tests: Recent Labs  Lab 12/20/23 1446 12/22/23 0534  AST 32 76*  ALT 35 41  ALKPHOS 110 81  BILITOT 0.9 0.8  PROT 8.1 6.2*  ALBUMIN 4.8 3.7   No results for input(s): LIPASE, AMYLASE in the last 168 hours. No results for input(s): AMMONIA in the last 168 hours. Coagulation Profile: Recent Labs  Lab 12/20/23 1446  INR 0.9   Cardiac Enzymes: No results for input(s): CKTOTAL, CKMB, CKMBINDEX, TROPONINI in the last 168 hours. BNP (last 3 results) No results for input(s): PROBNP in the last 8760 hours. HbA1C: Recent Labs    12/21/23 0047  HGBA1C 8.2*   CBG: Recent Labs  Lab 12/21/23 1613 12/21/23 1930 12/22/23 0004 12/22/23 0410 12/22/23 0719  GLUCAP 165* 154* 179* 168* 153*   Lipid Profile: No results for input(s): CHOL, HDL, LDLCALC, TRIG, CHOLHDL, LDLDIRECT in the last 72 hours. Thyroid Function Tests: No results for input(s): TSH, T4TOTAL, FREET4, T3FREE, THYROIDAB in the last 72 hours. Anemia Panel: No results for input(s): VITAMINB12, FOLATE, FERRITIN, TIBC, IRON, RETICCTPCT in the last 72 hours. Sepsis Labs: Recent Labs  Lab 12/20/23 1930 12/21/23 0047 12/21/23 0314 12/21/23 0536  LATICACIDVEN 3.6* 1.8 3.2* 1.7    Recent Results (from the past 240 hours)  Culture, blood (Routine x 2)     Status: None (Preliminary result)   Collection Time: 12/20/23   1:35 PM   Specimen: BLOOD RIGHT WRIST  Result Value Ref Range Status   Specimen Description   Final    BLOOD RIGHT WRIST Performed at [facility], [address], [place], [place] [address]    Special Requests   Final    BOTTLES DRAWN AEROBIC AND ANAEROBIC Blood Culture adequate volume Performed at [facility], [address] [place], [place] [address]    Culture   Final    NO GROWTH 2 DAYS Performed at [facility], [address], [place], [place] [address]    Report Status PENDING  Incomplete  Culture, blood (Routine x 2)     Status: None (Preliminary result)   Collection Time: 12/20/23  2:46 PM   Specimen: BLOOD  Result Value Ref Range Status   Specimen Description   Final    BLOOD LEFT ANTECUBITAL Performed at [facility], [address], [place], [place] [address]    Special  Requests   Final    Blood Culture results may not be optimal due to an inadequate volume of blood received in culture bottles BOTTLES DRAWN AEROBIC AND ANAEROBIC Performed at [facility], [address] [place], [place] [address]    Culture   Final    NO GROWTH 2 DAYS Performed at [facility], [address] [place], [place] [address]    Report Status PENDING  Incomplete  Resp panel by RT-PCR (RSV, Flu A&B, Covid) Anterior Nasal Swab     Status: None   Collection Time: 12/20/23  2:47 PM   Specimen: Anterior Nasal Swab  Result Value Ref Range Status   SARS Coronavirus 2 by RT PCR NEGATIVE NEGATIVE Final    Comment: (NOTE) SARS-CoV-2 target nucleic acids are NOT DETECTED.  The SARS-CoV-2 RNA is generally detectable in upper respiratory specimens during the acute phase of infection. The lowest concentration of SARS-CoV-2 viral copies this assay can detect is 138 copies/mL. A negative result does not preclude SARS-Cov-2 infection and should not be used as the sole basis for treatment or other patient management  decisions. A negative result may occur with  improper specimen collection/handling, submission of specimen other than nasopharyngeal swab, presence of viral mutation(s) within the areas targeted by this assay, and inadequate number of viral copies(<138 copies/mL). A negative result must be combined with clinical observations, patient history, and epidemiological information. The expected result is Negative.  Fact Sheet for Patients:  bloggercourse.com  Fact Sheet for Healthcare Providers:  seriousbroker.it  This test is no t yet approved or cleared by the [place]  FDA and  has been authorized for detection and/or diagnosis of SARS-CoV-2 by FDA under an Emergency Use Authorization (EUA). This EUA will remain  in effect (meaning this test can be used) for the duration of the COVID-19 declaration under Section 564(b)(1) of the Act, 21 U.S.C.section 360bbb-3(b)(1), unless the authorization is terminated  or revoked sooner.       Influenza A by PCR NEGATIVE NEGATIVE Final   Influenza B by PCR NEGATIVE NEGATIVE Final    Comment: (NOTE) The Xpert Xpress SARS-CoV-2/FLU/RSV plus assay is intended as an aid in the diagnosis of influenza from Nasopharyngeal swab specimens and should not be used as a sole basis for treatment. Nasal washings and aspirates are unacceptable for Xpert Xpress SARS-CoV-2/FLU/RSV testing.  Fact Sheet for Patients: bloggercourse.com  Fact Sheet for Healthcare Providers: seriousbroker.it  This test is not yet approved or cleared by the [place]  FDA and has been authorized for detection and/or diagnosis of SARS-CoV-2 by FDA under an Emergency Use Authorization (EUA). This EUA will remain in effect (meaning this test can be used) for the duration of the COVID-19 declaration under Section 564(b)(1) of the Act, 21 U.S.C. section 360bbb-3(b)(1), unless the  authorization is terminated or revoked.     Resp Syncytial Virus by PCR NEGATIVE NEGATIVE Final    Comment: (NOTE) Fact Sheet for Patients: bloggercourse.com  Fact Sheet for Healthcare Providers: seriousbroker.it  This test is not yet approved or cleared by the [place]  FDA and has been authorized for detection and/or diagnosis of SARS-CoV-2 by FDA under an Emergency Use Authorization (EUA). This EUA will remain in effect (meaning this test can be used) for the duration of the COVID-19 declaration under Section 564(b)(1) of the Act, 21 U.S.C. section 360bbb-3(b)(1), unless the authorization is terminated or revoked.  Performed at [facility], [address] [place], [place] [address]  Radiology Studies: CT ABDOMEN PELVIS W CONTRAST Result Date: 12/20/2023 CLINICAL DATA:  Abdominal pain. EXAM: CT ABDOMEN AND PELVIS WITH CONTRAST TECHNIQUE: Multidetector CT imaging of the abdomen and pelvis was performed using the standard protocol following bolus administration of intravenous contrast. RADIATION DOSE REDUCTION: This exam was performed according to the departmental dose-optimization program which includes automated exposure control, adjustment of the mA and/or kV according to patient size and/or use of iterative reconstruction technique. CONTRAST:  100mL OMNIPAQUE IOHEXOL 300 MG/ML  SOLN COMPARISON:  None Available. FINDINGS: Lower chest: The visualized lung bases are clear. No intra-abdominal free air or free fluid. Hepatobiliary: Fatty liver. No biliary ductal dilatation. Tiny gallstone. No pericholecystic fluid or evidence of acute cholecystitis by CT. Pancreas: Unremarkable. No pancreatic ductal dilatation or surrounding inflammatory changes. Spleen: Normal in size without focal abnormality. Adrenals/Urinary Tract: The adrenal glands, kidneys, visualized ureters, and urinary bladder appear  unremarkable. Stomach/Bowel: There is postsurgical changes of the bowel with ileocolic anastomosis in the right lower quadrant. There is no bowel obstruction or active inflammation. Appendectomy. Vascular/Lymphatic: Moderate aortoiliac atherosclerotic disease. The IVC is unremarkable. No portal venous gas. There is no adenopathy. Reproductive: The prostate and seminal vesicles are grossly remarkable no pelvic mass. Other: Small fat containing left inguinal and umbilical hernia. Musculoskeletal: Degenerative changes of spine. No acute osseous pathology. IMPRESSION: 1. No acute intra-abdominal or pelvic pathology. 2. Fatty liver. 3. Cholelithiasis. 4. Postsurgical changes of the bowel with ileocolic anastomosis in the right lower quadrant. No bowel obstruction. 5.  Aortic Atherosclerosis (ICD10-I70.0). Electronically Signed   By: Vanetta Chou M.D.   On: 12/20/2023 17:54   DG Chest Port 1 View Result Date: 12/20/2023 CLINICAL DATA:  Shortness of breath, projectile vomiting for 2 days, diabetes EXAM: PORTABLE CHEST 1 VIEW COMPARISON:  09/17/2018 FINDINGS: The heart size and mediastinal contours are within normal limits. Both lungs are clear. The visualized skeletal structures are unremarkable. IMPRESSION: No active disease. Electronically Signed   By: Ozell Daring M.D.   On: 12/20/2023 16:43        Scheduled Meds:  amLODipine   10 mg Oral Daily   carvedilol   12.5 mg Oral BID WC   enoxaparin  (LOVENOX ) injection  40 mg Subcutaneous Q24H   insulin  aspart  0-15 Units Subcutaneous Q4H   ondansetron  (ZOFRAN ) IV  4 mg Intravenous Q6H   pantoprazole (PROTONIX) IV  40 mg Intravenous Q12H   Continuous Infusions:  lactated ringers  100 mL/hr at 12/22/23 0600   promethazine (PHENERGAN) injection (IM or IVPB) Stopped (12/22/23 0523)          Sophie Mao, MD [facility] 12/22/2023, 9:36 AM

## 2023-12-23 ENCOUNTER — Other Ambulatory Visit (HOSPITAL_BASED_OUTPATIENT_CLINIC_OR_DEPARTMENT_OTHER): Payer: Self-pay

## 2023-12-23 ENCOUNTER — Other Ambulatory Visit (HOSPITAL_COMMUNITY): Payer: Self-pay

## 2023-12-23 DIAGNOSIS — E1165 Type 2 diabetes mellitus with hyperglycemia: Secondary | ICD-10-CM | POA: Diagnosis not present

## 2023-12-23 LAB — GLUCOSE, CAPILLARY
Glucose-Capillary: 136 mg/dL — ABNORMAL HIGH (ref 70–99)
Glucose-Capillary: 138 mg/dL — ABNORMAL HIGH (ref 70–99)
Glucose-Capillary: 144 mg/dL — ABNORMAL HIGH (ref 70–99)
Glucose-Capillary: 173 mg/dL — ABNORMAL HIGH (ref 70–99)

## 2023-12-23 MED ORDER — PANTOPRAZOLE SODIUM 40 MG PO TBEC
40.0000 mg | DELAYED_RELEASE_TABLET | Freq: Two times a day (BID) | ORAL | 1 refills | Status: DC
  Filled 2023-12-23: qty 30, 15d supply, fill #0

## 2023-12-23 MED ORDER — ALUM & MAG HYDROXIDE-SIMETH 200-200-20 MG/5ML PO SUSP
15.0000 mL | ORAL | 0 refills | Status: AC | PRN
  Filled 2023-12-23: qty 355, 4d supply, fill #0

## 2023-12-23 MED ORDER — METFORMIN HCL ER 500 MG PO TB24: 1500.0000 mg | ORAL_TABLET | ORAL | Status: DC

## 2023-12-23 MED ORDER — ONDANSETRON HCL 4 MG PO TABS
4.0000 mg | ORAL_TABLET | Freq: Three times a day (TID) | ORAL | 0 refills | Status: AC | PRN
  Filled 2023-12-23: qty 20, 7d supply, fill #0

## 2023-12-23 NOTE — Plan of Care (Signed)

## 2023-12-23 NOTE — Discharge Summary (Signed)
 Physician Discharge Summary  Dustin Lozano FMW:[id number] DOB: 12-Nov-1960 DOA: 12/20/2023  PCP: Emerick Avelina POUR, PA-C  Admit date: 12/20/2023 Discharge date: 12/23/2023  Admitted From: Home Disposition: Home  Recommendations for Outpatient Follow-up:  Follow up with PCP in 1 week with repeat CBC/BMP Follow up in ED if symptoms worsen or new appear   Home Health: No Equipment/Devices: None  Discharge Condition: Stable CODE STATUS: Full Diet recommendation: Heart healthy/carb modified  Brief/Interim Summary: 63 y.o. male with medical history significant for chronic systolic heart failure, uncontrolled T2DM not on insulin , HTN, bilateral BKA, CAD, GERD, HLD and diabetic neuropathy presented with abdominal pain, nausea and vomiting.  On presentation, patient was hypoglycemic with bicarb of 20, creatinine of 1.45, anion gap of 21, WBC 19.4 with elevated lactic acid of 4.7 and 3.6 and UA not suggestive of infection.  Chest x-ray showed no active disease.  CT of abdomen and pelvis showed fatty liver and cholelithiasis but no acute intra-abdominal pathology.  He was started on IV fluids, IV Protonix and antiemetics.  During the hospitalization, his condition has improved.  He is currently tolerating diet and wants to go home today.  Discharge patient home today.  Outpatient follow-up with PCP.  Discharge Diagnoses:   Diabetes type 2 with severe hyperglycemia and early DKA - Presented with abdominal pain, nausea and vomiting with mild acidosis and elevated anion gap and hypoglycemia. -Blood sugars improving.   A1c 8.2.  diabetes coordinator evaluation appreciated. -Resume home regimen except for Farxiga  till reevaluation with PCP.  Outpatient follow-up with PCP.   Intractable nausea and vomiting with epigastric pain - Questionable cause.  Imaging as above.  Continue IV fluids and antiemetics with analgesics as needed.  Currently on IV Protonix - During the hospitalization, his condition  has improved.  He is currently tolerating diet and wants to go home today.  Discharge patient home today.  Outpatient follow-up with PCP.  Continue oral Protonix twice a day on discharge.   AKI Acute metabolic acidosis - Possible prerenal from above.  Creatinine 1.45 on admission.  Improved with IV fluids.  Outpatient follow-up.  Lactic acidosis - Resolved.  No evidence of infection   Leukocytosis -Resolved   Thrombocytosis -Resolved   Hypertensive urgency - Blood pressure still elevated intermittently.  Continue home regimen.  Outpatient follow-up.   GERD -continue PPI  Chronic systolic CHF - Diet and fluid restriction.  Showed no signs of compensation currently.  Outpatient follow-up with cardiology.  Continue lisinopril  and Coreg    CAD Hyperlipidemia -Resume home regimen.  Outpatient follow-up   Discharge Instructions  Discharge Instructions     Diet - low sodium heart healthy   Complete by: As directed    Diet Carb Modified   Complete by: As directed    Increase activity slowly   Complete by: As directed       Allergies as of 12/23/2023   No Known Allergies      Medication List     STOP taking these medications    erythromycin  ophthalmic ointment   Farxiga  5 MG Tabs tablet Generic drug: dapagliflozin  propanediol   omeprazole  40 MG capsule Commonly known as: PRILOSEC   One-A-Day Mens 50+ Advantage Tabs       TAKE these medications    Accu-Chek FastClix Lancets Misc 1 Device by Does not apply route 2 (two) times daily.   Accu-Chek Guide w/Device Kit Use to check blood sugar 3 times daily as directed   acetaminophen  325 MG tablet Commonly known  as: TYLENOL  Take 650 mg by mouth every 6 (six) hours as needed.   alum & mag hydroxide-simeth 200-200-20 MG/5ML suspension Commonly known as: MAALOX/MYLANTA Take 15 mLs by mouth every 4 (four) hours as needed for indigestion or heartburn.   amLODipine  10 MG tablet Commonly known as:  NORVASC  Take 1 tablet (10 mg total) by mouth daily.   aspirin  EC 81 MG tablet Take 81 mg by mouth daily.   atorvastatin  20 MG tablet Commonly known as: LIPITOR Take 1 tablet (20 mg total) by mouth daily.   carvedilol  12.5 MG tablet Commonly known as: COREG  Take 1 tablet (12.5 mg total) by mouth in the morning and 1 tablet (12.5 mg total) in the evening. Take with meals.   FREESTYLE LITE test strip Generic drug: glucose blood USE ONE STRIP TO CHECK GLUCOSE TWICE DAILY.   Accu-Chek Guide Test test strip Generic drug: glucose blood Test 2 (two) times a day. Use as instructed   gabapentin  800 MG tablet Commonly known as: NEURONTIN  Take 1 tablet (800 mg total) by mouth 2 (two) times daily.   Insulin  Pen Needle 32G X 4 MM Misc Commonly known as: BD Pen Needle Nano U/F Test blood sugars as directed.   lisinopril  40 MG tablet Commonly known as: ZESTRIL  Take 1 tablet (40 mg total) by mouth daily.   metFORMIN  500 MG 24 hr tablet Commonly known as: GLUCOPHAGE -XR Take 3 tablets (1,500 mg total) by mouth See admin instructions. 2 tabs in AM and 1 tab in PM   ondansetron  4 MG tablet Commonly known as: Zofran  Take 1 tablet (4 mg total) by mouth every 8 (eight) hours as needed for nausea or vomiting.   pantoprazole 40 MG tablet Commonly known as: Protonix Take 1 tablet (40 mg total) by mouth 2 (two) times daily before a meal.   tamsulosin  0.4 MG Caps capsule Commonly known as: FLOMAX  Take 1 capsule (0.4 mg total) by mouth daily.   Trulicity  3 MG/0.5ML Soaj Generic drug: Dulaglutide  Inject 3 mg into the skin once a week. What changed: Another medication with the same name was removed. Continue taking this medication, and follow the directions you see here.   Vascepa  1 g capsule Generic drug: icosapent  Ethyl Take 2 capsules (2 g total) by mouth 2 (two) times daily.   zolpidem  10 MG tablet Commonly known as: AMBIEN  Take 10 mg by mouth at bedtime as needed for sleep.         Follow-up Information     Emerick Avelina POUR, PA-C. Schedule an appointment as soon as possible for a visit in 1 week(s).   Specialty: Physician Assistant Why: With repeat BMP Contact information: [address] [place] [address] [phone]                No Known Allergies  Consultations: None   Procedures/Studies: CT ABDOMEN PELVIS W CONTRAST Result Date: 12/20/2023 CLINICAL DATA:  Abdominal pain. EXAM: CT ABDOMEN AND PELVIS WITH CONTRAST TECHNIQUE: Multidetector CT imaging of the abdomen and pelvis was performed using the standard protocol following bolus administration of intravenous contrast. RADIATION DOSE REDUCTION: This exam was performed according to the departmental dose-optimization program which includes automated exposure control, adjustment of the mA and/or kV according to patient size and/or use of iterative reconstruction technique. CONTRAST:  100mL OMNIPAQUE IOHEXOL 300 MG/ML  SOLN COMPARISON:  None Available. FINDINGS: Lower chest: The visualized lung bases are clear. No intra-abdominal free air or free fluid. Hepatobiliary: Fatty liver. No biliary ductal  dilatation. Tiny gallstone. No pericholecystic fluid or evidence of acute cholecystitis by CT. Pancreas: Unremarkable. No pancreatic ductal dilatation or surrounding inflammatory changes. Spleen: Normal in size without focal abnormality. Adrenals/Urinary Tract: The adrenal glands, kidneys, visualized ureters, and urinary bladder appear unremarkable. Stomach/Bowel: There is postsurgical changes of the bowel with ileocolic anastomosis in the right lower quadrant. There is no bowel obstruction or active inflammation. Appendectomy. Vascular/Lymphatic: Moderate aortoiliac atherosclerotic disease. The IVC is unremarkable. No portal venous gas. There is no adenopathy. Reproductive: The prostate and seminal vesicles are grossly remarkable no pelvic mass. Other: Small fat containing left inguinal and  umbilical hernia. Musculoskeletal: Degenerative changes of spine. No acute osseous pathology. IMPRESSION: 1. No acute intra-abdominal or pelvic pathology. 2. Fatty liver. 3. Cholelithiasis. 4. Postsurgical changes of the bowel with ileocolic anastomosis in the right lower quadrant. No bowel obstruction. 5.  Aortic Atherosclerosis (ICD10-I70.0). Electronically Signed   By: Vanetta Chou M.D.   On: 12/20/2023 17:54   DG Chest Port 1 View Result Date: 12/20/2023 CLINICAL DATA:  Shortness of breath, projectile vomiting for 2 days, diabetes EXAM: PORTABLE CHEST 1 VIEW COMPARISON:  09/17/2018 FINDINGS: The heart size and mediastinal contours are within normal limits. Both lungs are clear. The visualized skeletal structures are unremarkable. IMPRESSION: No active disease. Electronically Signed   By: Ozell Daring M.D.   On: 12/20/2023 16:43      Subjective: Patient seen and examined at bedside.  Feels better and tolerating diet.  No fever, vomiting reported.  Wants to go home today.  Discharge Exam: Vitals:   12/23/23 0601 12/23/23 1256  BP: (!) 158/85 (!) 141/91  Pulse: 79 82  Resp:  18  Temp: 98.2 F (36.8 C) 97.9 F (36.6 C)  SpO2:  96%    General: Pt is alert, awake, not in acute distress.  On room air. Cardiovascular: rate controlled, S1/S2 + Respiratory: bilateral decreased breath sounds at bases Abdominal: Soft, NT, ND, bowel sounds + Extremities: Bilateral BKA present   The results of significant diagnostics from this hospitalization (including imaging, microbiology, ancillary and laboratory) are listed below for reference.     Microbiology: Recent Results (from the past 240 hours)  Culture, blood (Routine x 2)     Status: None (Preliminary result)   Collection Time: 12/20/23  1:35 PM   Specimen: BLOOD RIGHT WRIST  Result Value Ref Range Status   Specimen Description   Final    BLOOD RIGHT WRIST Performed at [facility], [address] [place], [place]  [address]    Special Requests   Final    BOTTLES DRAWN AEROBIC AND ANAEROBIC Blood Culture adequate volume Performed at [facility], [address], [place], [place] [address]    Culture   Final    NO GROWTH 3 DAYS Performed at [facility], [address] [place], [place] [address]    Report Status PENDING  Incomplete  Culture, blood (Routine x 2)     Status: None (Preliminary result)   Collection Time: 12/20/23  2:46 PM   Specimen: BLOOD  Result Value Ref Range Status   Specimen Description   Final    BLOOD LEFT ANTECUBITAL Performed at [facility], [address], [place], [place] [address]    Special Requests   Final    Blood Culture results may not be optimal due to an inadequate volume of blood received in culture bottles BOTTLES DRAWN AEROBIC AND ANAEROBIC Performed at [facility], [address], [place], [place] [address]    Culture   Final    NO GROWTH 3 DAYS Performed at [facility], [address] [place], [place] [address]    Report Status PENDING  Incomplete  Resp panel by RT-PCR (RSV, Flu A&B, Covid) Anterior Nasal Swab     Status: None   Collection Time: 12/20/23  2:47 PM   Specimen: Anterior Nasal Swab  Result Value Ref Range Status   SARS Coronavirus 2 by RT PCR NEGATIVE NEGATIVE Final    Comment: (NOTE) SARS-CoV-2 target nucleic acids are NOT DETECTED.  The SARS-CoV-2 RNA is generally detectable in upper respiratory specimens during the acute phase of infection. The lowest concentration of SARS-CoV-2 viral copies this assay can detect is 138 copies/mL. A negative result does not preclude SARS-Cov-2 infection and should not be used as the sole basis for treatment or other patient management decisions. A negative result may occur with  improper specimen collection/handling, submission of specimen other than nasopharyngeal swab, presence of viral mutation(s) within the areas targeted  by this assay, and inadequate number of viral copies(<138 copies/mL). A negative result must be combined with clinical observations, patient history, and epidemiological information. The expected result is Negative.  Fact Sheet for Patients:  bloggercourse.com  Fact Sheet for Healthcare Providers:  seriousbroker.it  This test is no t yet approved or cleared by the [place]  FDA and  has been authorized for detection and/or diagnosis of SARS-CoV-2 by FDA under an Emergency Use Authorization (EUA). This EUA will remain  in effect (meaning this test can be used) for the duration of the COVID-19 declaration under Section 564(b)(1) of the Act, 21 U.S.C.section 360bbb-3(b)(1), unless the authorization is terminated  or revoked sooner.       Influenza A by PCR NEGATIVE NEGATIVE Final   Influenza B by PCR NEGATIVE NEGATIVE Final    Comment: (NOTE) The Xpert Xpress SARS-CoV-2/FLU/RSV plus assay is intended as an aid in the diagnosis of influenza from Nasopharyngeal swab specimens and should not be used as a sole basis for treatment. Nasal washings and aspirates are unacceptable for Xpert Xpress SARS-CoV-2/FLU/RSV testing.  Fact Sheet for Patients: bloggercourse.com  Fact Sheet for Healthcare Providers: seriousbroker.it  This test is not yet approved or cleared by the [place]  FDA and has been authorized for detection and/or diagnosis of SARS-CoV-2 by FDA under an Emergency Use Authorization (EUA). This EUA will remain in effect (meaning this test can be used) for the duration of the COVID-19 declaration under Section 564(b)(1) of the Act, 21 U.S.C. section 360bbb-3(b)(1), unless the authorization is terminated or revoked.     Resp Syncytial Virus by PCR NEGATIVE NEGATIVE Final    Comment: (NOTE) Fact Sheet for Patients: bloggercourse.com  Fact  Sheet for Healthcare Providers: seriousbroker.it  This test is not yet approved or cleared by the [place]  FDA and has been authorized for detection and/or diagnosis of SARS-CoV-2 by FDA under an Emergency Use Authorization (EUA). This EUA will remain in effect (meaning this test can be used) for the duration of the COVID-19 declaration under Section 564(b)(1) of the Act, 21 U.S.C. section 360bbb-3(b)(1), unless the authorization is terminated or revoked.  Performed at [facility], [address] [place], [place] [address]      Labs: BNP (last 3 results) No results for input(s): BNP in the last 8760 hours. Basic Metabolic Panel: Recent Labs  Lab 12/20/23 1446 12/20/23 1528 12/21/23 0047 12/22/23 0534  NA 137 136 137 136  K 4.3 4.3 3.9 3.6  CL 95*  --  100 101  CO2 20*  --  23 25  GLUCOSE 441*  --  281* 157*  BUN 25*  --  23 18  CREATININE 1.45*  --  1.11 0.98  CALCIUM  10.5*  --  9.3 8.8*  MG  --   --   --  1.7   Liver Function Tests: Recent Labs  Lab 12/20/23 1446 12/22/23 0534  AST 32 76*  ALT 35 41  ALKPHOS 110 81  BILITOT 0.9 0.8  PROT 8.1 6.2*  ALBUMIN 4.8 3.7   No results for input(s): LIPASE, AMYLASE in the last 168 hours. No results for input(s): AMMONIA in the last 168 hours. CBC: Recent Labs  Lab 12/20/23 1446 12/20/23 1528 12/21/23 0047 12/22/23 0534  WBC 19.4*  --  16.6* 9.6  NEUTROABS 16.4*  --   --  6.8  HGB 15.6 15.0 13.7 13.3  HCT 43.8 44.0 39.9 38.6*  MCV 84.4  --  86.2 84.8  PLT 405*  --  312 255   Cardiac Enzymes: No results for input(s): CKTOTAL, CKMB, CKMBINDEX, TROPONINI in the last 168 hours. BNP: Invalid input(s): POCBNP CBG: Recent Labs  Lab 12/22/23 2011 12/23/23 0004 12/23/23 0402 12/23/23 0719 12/23/23 1105  GLUCAP 162* 138* 136* 144* 173*   D-Dimer No results for input(s): DDIMER in the last 72 hours. Hgb A1c Recent Labs     12/21/23 0047  HGBA1C 8.2*   Lipid Profile No results for input(s): CHOL, HDL, LDLCALC, TRIG, CHOLHDL, LDLDIRECT in the last 72 hours. Thyroid function studies No results for input(s): TSH, T4TOTAL, T3FREE, THYROIDAB in the last 72 hours.  Invalid input(s): FREET3 Anemia work up No results for input(s): VITAMINB12, FOLATE, FERRITIN, TIBC, IRON, RETICCTPCT in the last 72 hours. Urinalysis    Component Value Date/Time   COLORURINE YELLOW 12/20/2023 1812   APPEARANCEUR CLEAR 12/20/2023 1812   LABSPEC 1.039 (H) 12/20/2023 1812   PHURINE 5.5 12/20/2023 1812   GLUCOSEU >1,000 (A) 12/20/2023 1812   HGBUR MODERATE (A) 12/20/2023 1812   BILIRUBINUR NEGATIVE 12/20/2023 1812   BILIRUBINUR n 10/09/2015 1008   KETONESUR 15 (A) 12/20/2023 1812   PROTEINUR 30 (A) 12/20/2023 1812   UROBILINOGEN 0.2 10/09/2015 1008   NITRITE NEGATIVE 12/20/2023 1812   LEUKOCYTESUR NEGATIVE 12/20/2023 1812   Sepsis Labs Recent Labs  Lab 12/20/23 1446 12/21/23 0047 12/22/23 0534  WBC 19.4* 16.6* 9.6   Microbiology Recent Results (from the past 240 hours)  Culture, blood (Routine x 2)     Status: None (Preliminary result)   Collection Time: 12/20/23  1:35 PM   Specimen: BLOOD RIGHT WRIST  Result Value Ref Range Status   Specimen Description   Final    BLOOD RIGHT WRIST Performed at [facility], [address], [place], [place] [address]    Special Requests   Final    BOTTLES DRAWN AEROBIC AND ANAEROBIC Blood Culture adequate volume Performed at [facility], [address], [place], [place] [address]    Culture   Final    NO GROWTH 3 DAYS Performed at [facility], [address], [place], [place] [address]    Report Status PENDING  Incomplete  Culture, blood (Routine x 2)     Status: None (Preliminary result)   Collection Time: 12/20/23  2:46 PM   Specimen: BLOOD  Result Value Ref Range Status   Specimen Description   Final     BLOOD  LEFT ANTECUBITAL Performed at [facility], [address] [place], [place] [address]    Special Requests   Final    Blood Culture results may not be optimal due to an inadequate volume of blood received in culture bottles BOTTLES DRAWN AEROBIC AND ANAEROBIC Performed at [facility], [address] [place], [place] [address]    Culture   Final    NO GROWTH 3 DAYS Performed at [facility], [address], [place], [place] [address]    Report Status PENDING  Incomplete  Resp panel by RT-PCR (RSV, Flu A&B, Covid) Anterior Nasal Swab     Status: None   Collection Time: 12/20/23  2:47 PM   Specimen: Anterior Nasal Swab  Result Value Ref Range Status   SARS Coronavirus 2 by RT PCR NEGATIVE NEGATIVE Final    Comment: (NOTE) SARS-CoV-2 target nucleic acids are NOT DETECTED.  The SARS-CoV-2 RNA is generally detectable in upper respiratory specimens during the acute phase of infection. The lowest concentration of SARS-CoV-2 viral copies this assay can detect is 138 copies/mL. A negative result does not preclude SARS-Cov-2 infection and should not be used as the sole basis for treatment or other patient management decisions. A negative result may occur with  improper specimen collection/handling, submission of specimen other than nasopharyngeal swab, presence of viral mutation(s) within the areas targeted by this assay, and inadequate number of viral copies(<138 copies/mL). A negative result must be combined with clinical observations, patient history, and epidemiological information. The expected result is Negative.  Fact Sheet for Patients:  bloggercourse.com  Fact Sheet for Healthcare Providers:  seriousbroker.it  This test is no t yet approved or cleared by the [place]  FDA and  has been authorized for detection and/or diagnosis of SARS-CoV-2 by FDA under an Emergency Use  Authorization (EUA). This EUA will remain  in effect (meaning this test can be used) for the duration of the COVID-19 declaration under Section 564(b)(1) of the Act, 21 U.S.C.section 360bbb-3(b)(1), unless the authorization is terminated  or revoked sooner.       Influenza A by PCR NEGATIVE NEGATIVE Final   Influenza B by PCR NEGATIVE NEGATIVE Final    Comment: (NOTE) The Xpert Xpress SARS-CoV-2/FLU/RSV plus assay is intended as an aid in the diagnosis of influenza from Nasopharyngeal swab specimens and should not be used as a sole basis for treatment. Nasal washings and aspirates are unacceptable for Xpert Xpress SARS-CoV-2/FLU/RSV testing.  Fact Sheet for Patients: bloggercourse.com  Fact Sheet for Healthcare Providers: seriousbroker.it  This test is not yet approved or cleared by the [place]  FDA and has been authorized for detection and/or diagnosis of SARS-CoV-2 by FDA under an Emergency Use Authorization (EUA). This EUA will remain in effect (meaning this test can be used) for the duration of the COVID-19 declaration under Section 564(b)(1) of the Act, 21 U.S.C. section 360bbb-3(b)(1), unless the authorization is terminated or revoked.     Resp Syncytial Virus by PCR NEGATIVE NEGATIVE Final    Comment: (NOTE) Fact Sheet for Patients: bloggercourse.com  Fact Sheet for Healthcare Providers: seriousbroker.it  This test is not yet approved or cleared by the [place]  FDA and has been authorized for detection and/or diagnosis of SARS-CoV-2 by FDA under an Emergency Use Authorization (EUA). This EUA will remain in effect (meaning this test can be used) for the duration of the COVID-19 declaration under Section 564(b)(1) of the Act, 21 U.S.C. section 360bbb-3(b)(1), unless the authorization is terminated or revoked.  Performed at [facility], [address], [place], [place] [address]      Time coordinating discharge: 35 minutes  SIGNED:   Sophie Mao, MD  [facility] 12/23/2023, 2:29 PM

## 2023-12-23 NOTE — TOC Transition Note (Signed)
 Transition of Care [facility]) - Discharge Note   Patient Details  Name: Dustin Lozano MRN: [id number] Date of Birth: 04/03/1960  [facility] CM/SW Contact:  Bascom Service, RN Phone Number: 12/23/2023, 2:34 PM   Clinical Narrative:  d/c home family has own transport.     Final next level of care: Home/Self Care Barriers to Discharge: No Barriers Identified   Patient Goals and CMS Choice Patient states their goals for this hospitalization and ongoing recovery are:: Home CMS Medicare.gov Compare Post Acute Care list provided to:: Patient Represenative (must comment) Choice offered to / list presented to : Sibling [place] ownership interest in [facility].provided to:: Sibling    Discharge Placement                       Discharge Plan and Services Additional resources added to the After Visit Summary for     Discharge Planning Services: CM Consult Post Acute Care Choice: Resumption of Svcs/PTA Provider                               Social Drivers of Health (SDOH) Interventions SDOH Screenings   Food Insecurity: No Food Insecurity (12/20/2023)  Housing: Low Risk  (12/20/2023)  Transportation Needs: No Transportation Needs (12/20/2023)  Utilities: Not At Risk (12/20/2023)  Tobacco Use: Medium Risk (12/20/2023)     Readmission Risk Interventions     No data to display

## 2023-12-25 LAB — CULTURE, BLOOD (ROUTINE X 2)
Culture: NO GROWTH
Culture: NO GROWTH
Special Requests: ADEQUATE

## 2024-01-04 ENCOUNTER — Other Ambulatory Visit: Payer: Self-pay

## 2024-01-04 ENCOUNTER — Other Ambulatory Visit (HOSPITAL_BASED_OUTPATIENT_CLINIC_OR_DEPARTMENT_OTHER): Payer: Self-pay

## 2024-01-04 MED ORDER — DAPAGLIFLOZIN PROPANEDIOL 5 MG PO TABS
5.0000 mg | ORAL_TABLET | Freq: Every day | ORAL | 3 refills | Status: DC
  Filled 2024-01-04: qty 90, 90d supply, fill #0

## 2024-01-04 MED ORDER — METFORMIN HCL ER 500 MG PO TB24
1000.0000 mg | ORAL_TABLET | Freq: Two times a day (BID) | ORAL | 0 refills | Status: AC
  Filled 2024-01-04: qty 360, 90d supply, fill #0

## 2024-01-04 MED ORDER — ZOLPIDEM TARTRATE 10 MG PO TABS
10.0000 mg | ORAL_TABLET | Freq: Every evening | ORAL | 0 refills | Status: DC | PRN
  Filled 2024-01-04: qty 30, 30d supply, fill #0

## 2024-01-04 MED ORDER — ADACEL 5-2-15.5 LF-MCG/0.5 IM SUSY
0.5000 mL | PREFILLED_SYRINGE | Freq: Once | INTRAMUSCULAR | 0 refills | Status: AC
  Filled 2024-01-04: qty 0.5, 1d supply, fill #0

## 2024-01-06 ENCOUNTER — Other Ambulatory Visit (HOSPITAL_BASED_OUTPATIENT_CLINIC_OR_DEPARTMENT_OTHER): Payer: Self-pay

## 2024-01-07 ENCOUNTER — Other Ambulatory Visit (HOSPITAL_BASED_OUTPATIENT_CLINIC_OR_DEPARTMENT_OTHER): Payer: Self-pay

## 2024-01-21 ENCOUNTER — Other Ambulatory Visit (HOSPITAL_BASED_OUTPATIENT_CLINIC_OR_DEPARTMENT_OTHER): Payer: Self-pay

## 2024-01-29 ENCOUNTER — Other Ambulatory Visit (HOSPITAL_BASED_OUTPATIENT_CLINIC_OR_DEPARTMENT_OTHER): Payer: Self-pay

## 2024-02-01 ENCOUNTER — Other Ambulatory Visit (HOSPITAL_BASED_OUTPATIENT_CLINIC_OR_DEPARTMENT_OTHER): Payer: Self-pay

## 2024-02-04 ENCOUNTER — Other Ambulatory Visit (HOSPITAL_BASED_OUTPATIENT_CLINIC_OR_DEPARTMENT_OTHER): Payer: Self-pay

## 2024-02-06 ENCOUNTER — Other Ambulatory Visit (HOSPITAL_BASED_OUTPATIENT_CLINIC_OR_DEPARTMENT_OTHER): Payer: Self-pay

## 2024-02-06 ENCOUNTER — Other Ambulatory Visit: Payer: Self-pay

## 2024-02-11 ENCOUNTER — Other Ambulatory Visit (HOSPITAL_BASED_OUTPATIENT_CLINIC_OR_DEPARTMENT_OTHER): Payer: Self-pay

## 2024-02-13 ENCOUNTER — Other Ambulatory Visit (HOSPITAL_BASED_OUTPATIENT_CLINIC_OR_DEPARTMENT_OTHER): Payer: Self-pay

## 2024-02-13 MED ORDER — LISINOPRIL 40 MG PO TABS
40.0000 mg | ORAL_TABLET | Freq: Every day | ORAL | 3 refills | Status: AC
  Filled 2024-02-13: qty 90, 90d supply, fill #0

## 2024-02-15 ENCOUNTER — Other Ambulatory Visit (HOSPITAL_BASED_OUTPATIENT_CLINIC_OR_DEPARTMENT_OTHER): Payer: Self-pay

## 2024-02-15 MED ORDER — AMLODIPINE BESYLATE 10 MG PO TABS
10.0000 mg | ORAL_TABLET | Freq: Every day | ORAL | 0 refills | Status: AC
  Filled 2024-02-15: qty 90, 90d supply, fill #0

## 2024-02-17 ENCOUNTER — Other Ambulatory Visit (HOSPITAL_BASED_OUTPATIENT_CLINIC_OR_DEPARTMENT_OTHER): Payer: Self-pay

## 2024-02-24 ENCOUNTER — Ambulatory Visit: Admitting: Family

## 2024-02-24 ENCOUNTER — Encounter: Payer: Self-pay | Admitting: Family

## 2024-02-24 DIAGNOSIS — Z89511 Acquired absence of right leg below knee: Secondary | ICD-10-CM

## 2024-02-24 DIAGNOSIS — Z89512 Acquired absence of left leg below knee: Secondary | ICD-10-CM

## 2024-02-24 NOTE — Progress Notes (Signed)
 "  Office Visit Note   Patient: Dustin Lozano           Date of Birth: May 13, 1960           MRN: [id number] Visit Date: 02/24/2024              Requested by: Valdemar Rocky JONELLE, NP [address] [place],  [place] [address] PCP: Emerick Avelina POUR, PA-C  Chief Complaint  Patient presents with   Right Leg - Follow-up    Bilateral prosthetic Rx   Left Leg - Follow-up      HPI: The patient is a 64 year old gentleman status post bilateral below-knee amputations.  He reports that his liners are worn out and broken down he fears skin breakdown.  He also needs new shrinkers and layers of ply  Overall doing well  Patient is an existing bilateral transtibial  amputee.  Patient's current comorbidities are not expected to impact the ability to function with the prescribed prosthesis. Patient verbally communicates a strong desire to use a prosthesis. Patient currently requires mobility aids to ambulate without a prosthesis.  Expects not to use mobility aids with a new prosthesis.  Patient is a K3 level ambulator that spends a lot of time walking around on uneven terrain over obstacles, up and down stairs, and ambulates with a variable cadence.     Assessment & Plan: Visit Diagnoses: No diagnosis found.  Plan: Order provided for new liners and supplies for bilateral below-knee prostheses.  Follow-up as needed.  Follow-Up Instructions: No follow-ups on file.   Ortho Exam  Patient is alert, oriented, no adenopathy, well-dressed, normal affect, normal respiratory effort. On examination bilateral residual limbs these are well consolidated well-healed there is no open area no ulceration no impending ulceration    Imaging: No results found. No images are attached to the encounter.  Labs: Lab Results  Component Value Date   HGBA1C 8.2 (H) 12/21/2023   HGBA1C 5.9 11/17/2016   HGBA1C 8.2 08/18/2016   REPTSTATUS 12/25/2023 FINAL 12/20/2023   CULT  12/20/2023    NO GROWTH 5  DAYS Performed at [facility], [address] [place], [place] [address]      Lab Results  Component Value Date   ALBUMIN 3.7 12/22/2023   ALBUMIN 4.8 12/20/2023   ALBUMIN 2.5 (L) 05/11/2017    Lab Results  Component Value Date   MG 1.7 12/22/2023   MG 1.9 08/15/2011   MG 2.0 08/14/2011   No results found for: VD25OH  No results found for: PREALBUMIN    Latest Ref Rng & Units 12/22/2023    5:34 AM 12/21/2023   12:47 AM 12/20/2023    3:28 PM  CBC EXTENDED  WBC 4.0 - 10.5 K/uL 9.6  16.6    RBC 4.22 - 5.81 MIL/uL 4.55  4.63    Hemoglobin 13.0 - 17.0 g/dL 86.6  86.2  84.9   HCT 39.0 - 52.0 % 38.6  39.9  44.0   Platelets 150 - 400 K/uL 255  312    NEUT# 1.7 - 7.7 K/uL 6.8     Lymph# 0.7 - 4.0 K/uL 1.5        There is no height or weight on file to calculate BMI.  Orders:  No orders of the defined types were placed in this encounter.  No orders of the defined types were placed in this encounter.    Procedures: No procedures performed  Clinical Data: No additional findings.  ROS:  All  other systems negative, except as noted in the HPI. Review of Systems  Objective: Vital Signs: There were no vitals taken for this visit.  Specialty Comments:  No specialty comments available.  PMFS History: Patient Active Problem List   Diagnosis Date Noted   Severe hyperglycemia due to diabetes mellitus (HCC) 12/21/2023   AKI (acute kidney injury) 12/21/2023   Lactic acidosis 12/21/2023   Intractable nausea and vomiting 12/21/2023   Epigastric pain 12/21/2023   High anion gap metabolic acidosis 12/21/2023   Hypertensive urgency 12/21/2023   Uncontrolled type 2 diabetes mellitus with hyperglycemia, without long-term current use of insulin  (HCC) 12/21/2023   Uncontrolled type 2 diabetes mellitus with hyperglycemia (HCC) 12/20/2023   S/P bilateral below knee amputation (HCC) 04/26/2018   Diabetic neuropathy (HCC) 10/22/2015   Chronic systolic heart failure  (HCC) 92/83/7986   HTN (hypertension), malignant 08/14/2011   Diabetes mellitus type 2, noninsulin dependent (HCC) 08/14/2011   Past Medical History:  Diagnosis Date   Allergy    Arthritis    CHF (congestive heart failure) (HCC)    Systolic - LVEF 55-60  (06/2012)    Coronary artery disease    Nonobstructive disease per cath 08/2011   Diabetes mellitus    GERD (gastroesophageal reflux disease)    HTN (hypertension)    Hyperlipidemia    Intractable nausea and vomiting 12/21/2023   Neuromuscular disorder (HCC)    Nerve damage    Family History  Problem Relation Age of Onset   Diabetes Father    Heart failure Father    Cancer Mother        lymphoma   Diabetes Mother    Colon cancer Neg Hx    Esophageal cancer Neg Hx    Rectal cancer Neg Hx    Stomach cancer Neg Hx     Past Surgical History:  Procedure Laterality Date   APPENDECTOMY     BELOW KNEE LEG AMPUTATION Bilateral    BROW LIFT Bilateral 04/21/2022   Procedure: UPPER EYELID BLEPHAROPLASTY;  Surgeon: Luciano Standing, MD;  Location:  SURGERY CENTER;  Service: ENT;  Laterality: Bilateral;   COLONOSCOPY     LEFT AND RIGHT HEART CATHETERIZATION WITH CORONARY ANGIOGRAM N/A 08/17/2011   Procedure: LEFT AND RIGHT HEART CATHETERIZATION WITH CORONARY ANGIOGRAM;  Surgeon: Ozell Fell, MD;  Location: [facility] CATH LAB;  Service: Cardiovascular;  Laterality: N/A;   POLYPECTOMY     Social History   Occupational History   Occupation: salesman  Tobacco Use   Smoking status: Former    Current packs/day: 0.00    Average packs/day: 0.7 packs/day for 15.0 years (10.5 ttl pk-yrs)    Types: Cigarettes    Start date: 02/09/1991    Quit date: 02/08/2006    Years since quitting: 18.0   Smokeless tobacco: Never  Substance and Sexual Activity   Alcohol use: Yes    Comment: rare   Drug use: No   Sexual activity: Not on file       "

## 2024-02-25 ENCOUNTER — Other Ambulatory Visit (HOSPITAL_BASED_OUTPATIENT_CLINIC_OR_DEPARTMENT_OTHER): Payer: Self-pay

## 2024-02-28 ENCOUNTER — Other Ambulatory Visit (HOSPITAL_BASED_OUTPATIENT_CLINIC_OR_DEPARTMENT_OTHER): Payer: Self-pay

## 2024-02-28 MED ORDER — ATORVASTATIN CALCIUM 20 MG PO TABS
20.0000 mg | ORAL_TABLET | Freq: Every day | ORAL | 2 refills | Status: AC
  Filled 2024-02-28: qty 90, 90d supply, fill #0

## 2024-03-09 ENCOUNTER — Other Ambulatory Visit: Payer: Self-pay

## 2024-03-09 ENCOUNTER — Other Ambulatory Visit (HOSPITAL_BASED_OUTPATIENT_CLINIC_OR_DEPARTMENT_OTHER): Payer: Self-pay

## 2024-03-10 ENCOUNTER — Other Ambulatory Visit (HOSPITAL_BASED_OUTPATIENT_CLINIC_OR_DEPARTMENT_OTHER): Payer: Self-pay

## 2024-03-10 MED ORDER — ZOLPIDEM TARTRATE 10 MG PO TABS
10.0000 mg | ORAL_TABLET | Freq: Every evening | ORAL | 0 refills | Status: AC | PRN
  Filled 2024-03-10: qty 30, 30d supply, fill #0

## 2024-03-10 MED ORDER — TAMSULOSIN HCL 0.4 MG PO CAPS
0.4000 mg | ORAL_CAPSULE | Freq: Every day | ORAL | 0 refills | Status: AC
  Filled 2024-03-10: qty 90, 90d supply, fill #0

## 2024-03-13 ENCOUNTER — Other Ambulatory Visit (HOSPITAL_BASED_OUTPATIENT_CLINIC_OR_DEPARTMENT_OTHER): Payer: Self-pay

## 2024-03-14 ENCOUNTER — Emergency Department (HOSPITAL_COMMUNITY)

## 2024-03-14 ENCOUNTER — Encounter (HOSPITAL_COMMUNITY): Payer: Self-pay | Admitting: Internal Medicine

## 2024-03-14 ENCOUNTER — Observation Stay (HOSPITAL_COMMUNITY)
Admission: EM | Admit: 2024-03-14 | Discharge: 2024-03-16 | Disposition: A | Source: Home / Self Care | Attending: Emergency Medicine | Admitting: Emergency Medicine

## 2024-03-14 DIAGNOSIS — Z89511 Acquired absence of right leg below knee: Secondary | ICD-10-CM

## 2024-03-14 DIAGNOSIS — E119 Type 2 diabetes mellitus without complications: Secondary | ICD-10-CM

## 2024-03-14 DIAGNOSIS — R112 Nausea with vomiting, unspecified: Secondary | ICD-10-CM | POA: Diagnosis present

## 2024-03-14 DIAGNOSIS — E1110 Type 2 diabetes mellitus with ketoacidosis without coma: Secondary | ICD-10-CM | POA: Diagnosis present

## 2024-03-14 DIAGNOSIS — I5022 Chronic systolic (congestive) heart failure: Secondary | ICD-10-CM | POA: Diagnosis present

## 2024-03-14 DIAGNOSIS — I10 Essential (primary) hypertension: Secondary | ICD-10-CM | POA: Diagnosis present

## 2024-03-14 DIAGNOSIS — E1140 Type 2 diabetes mellitus with diabetic neuropathy, unspecified: Secondary | ICD-10-CM | POA: Diagnosis present

## 2024-03-14 DIAGNOSIS — N179 Acute kidney failure, unspecified: Secondary | ICD-10-CM | POA: Diagnosis present

## 2024-03-14 DIAGNOSIS — R6510 Systemic inflammatory response syndrome (SIRS) of non-infectious origin without acute organ dysfunction: Principal | ICD-10-CM | POA: Diagnosis present

## 2024-03-14 LAB — COMPREHENSIVE METABOLIC PANEL WITH GFR
ALT: 25 U/L (ref 0–44)
AST: 24 U/L (ref 15–41)
Albumin: 4.8 g/dL (ref 3.5–5.0)
Alkaline Phosphatase: 98 U/L (ref 38–126)
Anion gap: 20 — ABNORMAL HIGH (ref 5–15)
BUN: 28 mg/dL — ABNORMAL HIGH (ref 8–23)
CO2: 25 mmol/L (ref 22–32)
Calcium: 10 mg/dL (ref 8.9–10.3)
Chloride: 94 mmol/L — ABNORMAL LOW (ref 98–111)
Creatinine, Ser: 1.33 mg/dL — ABNORMAL HIGH (ref 0.61–1.24)
GFR, Estimated: >60 mL/min
Glucose, Bld: 281 mg/dL — ABNORMAL HIGH (ref 70–99)
Potassium: 4.5 mmol/L (ref 3.5–5.1)
Sodium: 139 mmol/L (ref 135–145)
Total Bilirubin: 0.8 mg/dL (ref 0.0–1.2)
Total Protein: 8 g/dL (ref 6.5–8.1)

## 2024-03-14 LAB — CBC WITH DIFFERENTIAL/PLATELET
Abs Immature Granulocytes: 0.07 10*3/uL (ref 0.00–0.07)
Basophils Absolute: 0 10*3/uL (ref 0.0–0.1)
Basophils Relative: 0 %
Eosinophils Absolute: 0 10*3/uL (ref 0.0–0.5)
Eosinophils Relative: 0 %
HCT: 45.1 % (ref 39.0–52.0)
Hemoglobin: 15.6 g/dL (ref 13.0–17.0)
Immature Granulocytes: 1 %
Lymphocytes Relative: 7 %
Lymphs Abs: 1.1 10*3/uL (ref 0.7–4.0)
MCH: 29.3 pg (ref 26.0–34.0)
MCHC: 34.6 g/dL (ref 30.0–36.0)
MCV: 84.6 fL (ref 80.0–100.0)
Monocytes Absolute: 0.7 10*3/uL (ref 0.1–1.0)
Monocytes Relative: 5 %
Neutro Abs: 13.2 10*3/uL — ABNORMAL HIGH (ref 1.7–7.7)
Neutrophils Relative %: 87 %
Platelets: 347 10*3/uL (ref 150–400)
RBC: 5.33 MIL/uL (ref 4.22–5.81)
RDW: 12.7 % (ref 11.5–15.5)
WBC: 15.1 10*3/uL — ABNORMAL HIGH (ref 4.0–10.5)
nRBC: 0 % (ref 0.0–0.2)

## 2024-03-14 LAB — URINALYSIS, W/ REFLEX TO CULTURE (INFECTION SUSPECTED)
Bacteria, UA: NONE SEEN
Bilirubin Urine: NEGATIVE
Glucose, UA: >=500 mg/dL — AB
Hgb urine dipstick: NEGATIVE
Ketones, ur: 80 mg/dL — AB
Leukocytes,Ua: NEGATIVE
Nitrite: NEGATIVE
Protein, ur: 100 mg/dL — AB
Specific Gravity, Urine: 1.025 (ref 1.005–1.030)
pH: 6 (ref 5.0–8.0)

## 2024-03-14 LAB — RESP PANEL BY RT-PCR (RSV, FLU A&B, COVID)  RVPGX2
Influenza A by PCR: NEGATIVE
Influenza B by PCR: NEGATIVE
Resp Syncytial Virus by PCR: NEGATIVE
SARS Coronavirus 2 by RT PCR: NEGATIVE

## 2024-03-14 LAB — CBG MONITORING, ED
Glucose-Capillary: 233 mg/dL — ABNORMAL HIGH (ref 70–99)
Glucose-Capillary: 214 mg/dL — ABNORMAL HIGH (ref 70–99)
Glucose-Capillary: 164 mg/dL — ABNORMAL HIGH (ref 70–99)

## 2024-03-14 LAB — I-STAT CG4 LACTIC ACID, ED
Lactic Acid, Venous: 2.8 mmol/L (ref 0.5–1.9)
Lactic Acid, Venous: 1.8 mmol/L (ref 0.5–1.9)

## 2024-03-14 LAB — LIPASE, BLOOD: Lipase: 44 U/L (ref 11–51)

## 2024-03-14 LAB — PROTIME-INR
INR: 1 (ref 0.8–1.2)
Prothrombin Time: 13.6 s (ref 11.4–15.2)

## 2024-03-14 MED ORDER — CARVEDILOL 12.5 MG PO TABS
12.5000 mg | ORAL_TABLET | Freq: Two times a day (BID) | ORAL | Status: DC
  Administered 2024-03-14 – 2024-03-16 (×4): 12.5 mg via ORAL
  Filled 2024-03-14 (×4): qty 1

## 2024-03-14 MED ORDER — TAMSULOSIN HCL 0.4 MG PO CAPS
0.4000 mg | ORAL_CAPSULE | Freq: Every day | ORAL | Status: DC
  Administered 2024-03-15 – 2024-03-16 (×2): 0.4 mg via ORAL
  Filled 2024-03-14 (×2): qty 1

## 2024-03-14 MED ORDER — ONDANSETRON HCL 4 MG/2ML IJ SOLN
4.0000 mg | Freq: Four times a day (QID) | INTRAMUSCULAR | Status: DC | PRN
  Filled 2024-03-14: qty 2

## 2024-03-14 MED ORDER — GABAPENTIN 400 MG PO CAPS
800.0000 mg | ORAL_CAPSULE | Freq: Two times a day (BID) | ORAL | Status: DC
  Administered 2024-03-14 – 2024-03-16 (×4): 800 mg via ORAL
  Filled 2024-03-14 (×4): qty 2

## 2024-03-14 MED ORDER — ASPIRIN 81 MG PO TBEC
81.0000 mg | DELAYED_RELEASE_TABLET | Freq: Every day | ORAL | Status: DC
  Administered 2024-03-15 – 2024-03-16 (×2): 81 mg via ORAL
  Filled 2024-03-14 (×2): qty 1

## 2024-03-14 MED ORDER — ACETAMINOPHEN 325 MG PO TABS: 650.0000 mg | ORAL_TABLET | Freq: Four times a day (QID) | ORAL | Status: DC | PRN

## 2024-03-14 MED ORDER — AMLODIPINE BESYLATE 5 MG PO TABS
10.0000 mg | ORAL_TABLET | Freq: Every day | ORAL | Status: DC
  Administered 2024-03-14 – 2024-03-16 (×3): 10 mg via ORAL
  Filled 2024-03-14: qty 1
  Filled 2024-03-14 (×2): qty 2

## 2024-03-14 MED ORDER — GUAIFENESIN ER 600 MG PO TB12: 600.0000 mg | ORAL_TABLET | Freq: Two times a day (BID) | ORAL | Status: DC | PRN

## 2024-03-14 MED ORDER — ALBUTEROL SULFATE (2.5 MG/3ML) 0.083% IN NEBU: 2.5000 mg | INHALATION_SOLUTION | Freq: Four times a day (QID) | RESPIRATORY_TRACT | Status: DC | PRN

## 2024-03-14 MED ORDER — INSULIN ASPART 100 UNIT/ML IJ SOLN
0.0000 [IU] | Freq: Three times a day (TID) | INTRAMUSCULAR | Status: DC
  Administered 2024-03-14: 3 [IU] via SUBCUTANEOUS
  Administered 2024-03-15: 2 [IU] via SUBCUTANEOUS
  Administered 2024-03-15: 1 [IU] via SUBCUTANEOUS
  Administered 2024-03-15: 3 [IU] via SUBCUTANEOUS
  Administered 2024-03-16: 1 [IU] via SUBCUTANEOUS
  Filled 2024-03-14: qty 1
  Filled 2024-03-14 (×2): qty 3
  Filled 2024-03-14: qty 1
  Filled 2024-03-14: qty 2

## 2024-03-14 MED ORDER — FAMOTIDINE IN NACL 20-0.9 MG/50ML-% IV SOLN
20.0000 mg | Freq: Once | INTRAVENOUS | Status: AC
  Administered 2024-03-14: 20 mg via INTRAVENOUS
  Filled 2024-03-14: qty 50

## 2024-03-14 MED ORDER — SODIUM CHLORIDE 0.9 % IV SOLN: INTRAVENOUS | Status: AC

## 2024-03-14 MED ORDER — ACETAMINOPHEN 650 MG RE SUPP: 650.0000 mg | Freq: Four times a day (QID) | RECTAL | Status: DC | PRN

## 2024-03-14 MED ORDER — ZOLPIDEM TARTRATE 5 MG PO TABS
10.0000 mg | ORAL_TABLET | Freq: Every evening | ORAL | Status: DC | PRN
  Administered 2024-03-15: 10 mg via ORAL
  Filled 2024-03-14: qty 2

## 2024-03-14 MED ORDER — SUCRALFATE 1 GM/10ML PO SUSP
1.0000 g | Freq: Once | ORAL | Status: AC
  Administered 2024-03-14: 1 g via ORAL
  Filled 2024-03-14: qty 10

## 2024-03-14 MED ORDER — VANCOMYCIN HCL IN DEXTROSE 1-5 GM/200ML-% IV SOLN
1000.0000 mg | Freq: Once | INTRAVENOUS | Status: AC
  Administered 2024-03-14: 1000 mg via INTRAVENOUS
  Filled 2024-03-14: qty 200

## 2024-03-14 MED ORDER — ENOXAPARIN SODIUM 40 MG/0.4ML IJ SOSY
40.0000 mg | PREFILLED_SYRINGE | INTRAMUSCULAR | Status: DC
  Administered 2024-03-14 – 2024-03-15 (×2): 40 mg via SUBCUTANEOUS
  Filled 2024-03-14 (×2): qty 0.4

## 2024-03-14 MED ORDER — SODIUM CHLORIDE 0.9 % IV SOLN
1.0000 g | INTRAVENOUS | Status: DC
  Administered 2024-03-15 – 2024-03-16 (×2): 1 g via INTRAVENOUS
  Filled 2024-03-14 (×2): qty 10

## 2024-03-14 MED ORDER — ICOSAPENT ETHYL 1 G PO CAPS
2.0000 g | ORAL_CAPSULE | Freq: Two times a day (BID) | ORAL | Status: DC
  Administered 2024-03-14 – 2024-03-16 (×3): 2 g via ORAL
  Filled 2024-03-14 (×4): qty 2

## 2024-03-14 MED ORDER — DAPAGLIFLOZIN PROPANEDIOL 5 MG PO TABS
5.0000 mg | ORAL_TABLET | Freq: Every day | ORAL | Status: DC
  Filled 2024-03-14: qty 1

## 2024-03-14 MED ORDER — ONDANSETRON HCL 4 MG PO TABS: 4.0000 mg | ORAL_TABLET | Freq: Four times a day (QID) | ORAL | Status: DC | PRN

## 2024-03-14 MED ORDER — HYDRALAZINE HCL 20 MG/ML IJ SOLN: 10.0000 mg | Freq: Four times a day (QID) | INTRAMUSCULAR | Status: DC | PRN

## 2024-03-14 MED ORDER — LACTATED RINGERS IV BOLUS (SEPSIS)
1000.0000 mL | Freq: Once | INTRAVENOUS | Status: AC
  Administered 2024-03-14: 1000 mL via INTRAVENOUS

## 2024-03-14 MED ORDER — INSULIN ASPART 100 UNIT/ML IJ SOLN
0.0000 [IU] | Freq: Every day | INTRAMUSCULAR | Status: DC
  Filled 2024-03-14: qty 2

## 2024-03-14 MED ORDER — ATORVASTATIN CALCIUM 20 MG PO TABS
20.0000 mg | ORAL_TABLET | Freq: Every day | ORAL | Status: DC
  Administered 2024-03-14 – 2024-03-15 (×2): 20 mg via ORAL
  Filled 2024-03-14: qty 2
  Filled 2024-03-14: qty 1

## 2024-03-14 MED ORDER — VANCOMYCIN HCL 1750 MG/350ML IV SOLN
1750.0000 mg | INTRAVENOUS | Status: DC
  Administered 2024-03-15: 1750 mg via INTRAVENOUS
  Filled 2024-03-14 (×2): qty 350

## 2024-03-14 MED ORDER — SODIUM CHLORIDE 0.9 % IV SOLN
2.0000 g | Freq: Once | INTRAVENOUS | Status: AC
  Administered 2024-03-14: 2 g via INTRAVENOUS
  Filled 2024-03-14: qty 20

## 2024-03-14 MED ORDER — ONDANSETRON HCL 4 MG/2ML IJ SOLN
4.0000 mg | Freq: Once | INTRAMUSCULAR | Status: AC
  Administered 2024-03-14: 4 mg via INTRAVENOUS
  Filled 2024-03-14: qty 2

## 2024-03-14 MED ORDER — VANCOMYCIN HCL 750 MG/150ML IV SOLN
750.0000 mg | INTRAVENOUS | Status: AC
  Administered 2024-03-14: 750 mg via INTRAVENOUS
  Filled 2024-03-14: qty 150

## 2024-03-14 MED ORDER — SODIUM CHLORIDE 0.9 % IV BOLUS
1000.0000 mL | Freq: Once | INTRAVENOUS | Status: AC
  Administered 2024-03-14: 1000 mL via INTRAVENOUS

## 2024-03-14 MED ORDER — PANTOPRAZOLE SODIUM 40 MG IV SOLR
40.0000 mg | Freq: Two times a day (BID) | INTRAVENOUS | Status: DC
  Administered 2024-03-14 – 2024-03-16 (×4): 40 mg via INTRAVENOUS
  Filled 2024-03-14 (×4): qty 10

## 2024-03-14 NOTE — Assessment & Plan Note (Addendum)
 Compensated on admission. Echo in November 2024 at [facility] showed normal EF 55-60%, recovered from 25-30% back in 2013. --Resume home Coreg , Farxiga  -- Hold lisinopril  for AKI --Monitor volume status closely with IV hydration

## 2024-03-14 NOTE — Assessment & Plan Note (Signed)
 No acute issues. Uses bilateral prosthetics.

## 2024-03-14 NOTE — ED Provider Notes (Signed)
 " [place] EMERGENCY DEPARTMENT AT [facility] Provider Note   CSN: [id number] Arrival date & time: 03/14/24  9188     Patient presents with: Nausea, Emesis, and Fever   Dustin Lozano is a 64 y.o. male.   HPI Patient presents with concern of feeling septic.  Over the past 24 hours patient has developed generalized discomfort, myalgia, nausea, vomiting, fatigue.  No objective fever, no syncope, no fall, no chest pain, no dyspnea.  Patient notes symptoms are similar to those experienced during an episode of sepsis last year.  No obvious precipitant. He does have oliguria as well as dysuria.    Prior to Admission medications  Medication Sig Start Date End Date Taking? Authorizing Provider  ACCU-CHEK FASTCLIX LANCETS MISC 1 Device by Does not apply route 2 (two) times daily. 08/18/16   Nafziger, Darleene, NP  acetaminophen  (TYLENOL ) 325 MG tablet Take 650 mg by mouth every 6 (six) hours as needed. Patient not taking: Reported on 12/21/2023    [provider]  alum & mag hydroxide-simeth (MAALOX/MYLANTA) 200-200-20 MG/5ML suspension Take 15 mLs by mouth every 4 (four) hours as needed for indigestion or heartburn. 12/23/23   Cheryle Page, MD  amLODipine  (NORVASC ) 10 MG tablet Take 1 tablet (10 mg total) by mouth daily. 02/15/24     aspirin  EC 81 MG tablet Take 81 mg by mouth daily.    [provider]  atorvastatin  (LIPITOR) 20 MG tablet Take 1 tablet (20 mg total) by mouth daily. 02/28/24     Blood Glucose Monitoring Suppl (ACCU-CHEK GUIDE) w/Device KIT Use to check blood sugar 3 times daily as directed 09/28/23     carvedilol  (COREG ) 12.5 MG tablet Take 1 tablet (12.5 mg total) by mouth in the morning and 1 tablet (12.5 mg total) in the evening. Take with meals. 06/18/22     dapagliflozin  propanediol (FARXIGA ) 5 MG TABS tablet Take 1 tablet (5 mg total) by mouth daily. 01/04/24     Dulaglutide  (TRULICITY ) 3 MG/0.5ML SOAJ Inject 3 mg into the skin once a week. 08/18/23      FREESTYLE LITE test strip USE ONE STRIP TO CHECK GLUCOSE TWICE DAILY.    Webb, Padonda B, FNP  gabapentin  (NEURONTIN ) 800 MG tablet Take 1 tablet (800 mg total) by mouth 2 (two) times daily. 09/07/23     glucose blood (ACCU-CHEK GUIDE TEST) test strip Test 2 (two) times a day. Use as instructed 09/29/23     icosapent  Ethyl (VASCEPA ) 1 g capsule Take 2 capsules (2 g total) by mouth 2 (two) times daily. 12/05/23     Insulin  Pen Needle (BD PEN NEEDLE NANO U/F) 32G X 4 MM MISC Test blood sugars as directed. Patient not taking: Reported on 07/10/2018 05/09/15   Nafziger, Cory, NP  lisinopril  (ZESTRIL ) 40 MG tablet Take 1 tablet (40 mg total) by mouth daily. 06/18/22     lisinopril  (ZESTRIL ) 40 MG tablet Take 1 tablet (40 mg total) by mouth daily. 02/13/24     metFORMIN  (GLUCOPHAGE -XR) 500 MG 24 hr tablet Take 3 tablets (1,500 mg total) by mouth See admin instructions. 2 tabs in AM and 1 tab in PM 12/23/23   Cheryle Page, MD  metFORMIN  (GLUCOPHAGE -XR) 500 MG 24 hr tablet Take 2 tablets (1,000 mg total) by mouth 2 (two) times daily. 01/04/24     ondansetron  (ZOFRAN ) 4 MG tablet Take 1 tablet (4 mg total) by mouth every 8 (eight) hours as needed for nausea or vomiting. 12/23/23  Cheryle Page, MD  pantoprazole  (PROTONIX ) 40 MG tablet Take 1 tablet (40 mg total) by mouth 2 (two) times daily before a meal. 12/23/23 01/22/24  Cheryle Page, MD  tamsulosin  (FLOMAX ) 0.4 MG CAPS capsule Take 1 capsule (0.4 mg total) by mouth daily. 03/09/24     zolpidem  (AMBIEN ) 10 MG tablet Take 10 mg by mouth at bedtime as needed for sleep.    [provider]  zolpidem  (AMBIEN ) 10 MG tablet Take 1 tablet (10 mg total) by mouth at bedtime as needed for sleep 03/09/24       Allergies: Patient has no known allergies.    Review of Systems  Updated Vital Signs BP (!) 179/93 (BP Location: Right Arm)   Pulse (!) 117   Temp 98 F (36.7 C) (Oral)   Resp 14   SpO2 97%   Physical Exam Vitals and nursing note reviewed.   Constitutional:      General: He is not in acute distress.    Appearance: He is well-developed.  HENT:     Head: Normocephalic and atraumatic.  Eyes:     Conjunctiva/sclera: Conjunctivae normal.  Cardiovascular:     Rate and Rhythm: Regular rhythm. Tachycardia present.  Pulmonary:     Effort: Pulmonary effort is normal. No respiratory distress.     Breath sounds: No stridor.  Abdominal:     General: There is no distension.  Musculoskeletal:     Comments: Bilateral amputee lower extremities  Skin:    General: Skin is warm and dry.  Neurological:     Mental Status: He is alert and oriented to person, place, and time.     (all labs ordered are listed, but only abnormal results are displayed) Labs Reviewed  COMPREHENSIVE METABOLIC PANEL WITH GFR - Abnormal; Notable for the following components:      Result Value   Chloride 94 (*)    Glucose, Bld 281 (*)    BUN 28 (*)    Creatinine, Ser 1.33 (*)    Anion gap 20 (*)    All other components within normal limits  CBC WITH DIFFERENTIAL/PLATELET - Abnormal; Notable for the following components:   WBC 15.1 (*)    Neutro Abs 13.2 (*)    All other components within normal limits  URINALYSIS, W/ REFLEX TO CULTURE (INFECTION SUSPECTED) - Abnormal; Notable for the following components:   Glucose, UA >=500 (*)    Ketones, ur 80 (*)    Protein, ur 100 (*)    All other components within normal limits  CBG MONITORING, ED - Abnormal; Notable for the following components:   Glucose-Capillary 233 (*)    All other components within normal limits  I-STAT CG4 LACTIC ACID, ED - Abnormal; Notable for the following components:   Lactic Acid, Venous 2.8 (*)    All other components within normal limits  CULTURE, BLOOD (ROUTINE X 2)  CULTURE, BLOOD (ROUTINE X 2)  RESP PANEL BY RT-PCR (RSV, FLU A&B, COVID)  RVPGX2  PROTIME-INR  LIPASE, BLOOD  I-STAT CG4 LACTIC ACID, ED    EKG: EKG Interpretation Date/Time:  Wednesday March 14 2024  08:22:52 EST Ventricular Rate:  121 PR Interval:  164 QRS Duration:  80 QT Interval:  312 QTC Calculation: 443 R Axis:   13  Text Interpretation: Sinus tachycardia Borderline T abnormalities, inferior leads Confirmed by Garrick Charleston [phone]) on 03/14/2024 9:49:21 AM  Radiology: [facility] Chest Port 1 View Result Date: 03/14/2024 CLINICAL DATA:  Questionable sepsis. EXAM: PORTABLE CHEST 1 VIEW  COMPARISON:  12/20/2023. FINDINGS: Trachea is midline. Heart size is accentuated by AP semi upright technique and somewhat low lung volumes. Lungs are clear. No pleural fluid. IMPRESSION: No acute findings. Electronically Signed   By: Newell Eke M.D.   On: 03/14/2024 09:13     Procedures   Medications Ordered in the ED  vancomycin  (VANCOCIN ) IVPB 1000 mg/200 mL premix (1,000 mg Intravenous New Bag/Given 03/14/24 1508)  enoxaparin  (LOVENOX ) injection 40 mg (has no administration in time range)  insulin  aspart (novoLOG ) injection 0-9 Units (has no administration in time range)  insulin  aspart (novoLOG ) injection 0-5 Units (has no administration in time range)  acetaminophen  (TYLENOL ) tablet 650 mg (has no administration in time range)    Or  acetaminophen  (TYLENOL ) suppository 650 mg (has no administration in time range)  ondansetron  (ZOFRAN ) tablet 4 mg (has no administration in time range)    Or  ondansetron  (ZOFRAN ) injection 4 mg (has no administration in time range)  albuterol  (PROVENTIL ) (2.5 MG/3ML) 0.083% nebulizer solution 2.5 mg (has no administration in time range)  guaiFENesin  (MUCINEX ) 12 hr tablet 600 mg (has no administration in time range)  carvedilol  (COREG ) tablet 12.5 mg (has no administration in time range)  dapagliflozin  propanediol (FARXIGA ) tablet 5 mg (has no administration in time range)  tamsulosin  (FLOMAX ) capsule 0.4 mg (has no administration in time range)  gabapentin  (NEURONTIN ) tablet 800 mg (has no administration in time range)  hydrALAZINE  (APRESOLINE ) injection 10 mg  (has no administration in time range)  atorvastatin  (LIPITOR) tablet 20 mg (has no administration in time range)  aspirin  EC tablet 81 mg (has no administration in time range)  amLODipine  (NORVASC ) tablet 10 mg (has no administration in time range)  pantoprazole  (PROTONIX ) injection 40 mg (has no administration in time range)  icosapent  Ethyl (VASCEPA ) 1 g capsule 2 g (has no administration in time range)  zolpidem  (AMBIEN ) tablet 10 mg (has no administration in time range)  cefTRIAXone  (ROCEPHIN ) 1 g in sodium chloride  0.9 % 100 mL IVPB (has no administration in time range)  lactated ringers  bolus 1,000 mL (0 mLs Intravenous Stopped 03/14/24 0951)  ondansetron  (ZOFRAN ) injection 4 mg (4 mg Intravenous Given 03/14/24 0919)  sodium chloride  0.9 % bolus 1,000 mL (0 mLs Intravenous Stopped 03/14/24 1508)  cefTRIAXone  (ROCEPHIN ) 2 g in sodium chloride  0.9 % 100 mL IVPB (0 g Intravenous Stopped 03/14/24 1129)  famotidine  (PEPCID ) IVPB 20 mg premix (0 mg Intravenous Stopped 03/14/24 1207)  sucralfate  (CARAFATE ) 1 GM/10ML suspension 1 g (1 g Oral Given 03/14/24 1136)                                    Medical Decision Making Adult male presents tachycardic, borderline tachypneic, nauseous, with vomiting, dysuria, broad differential including urinary tract infection, bacteremia, sepsis, electrolyte abnormalities, viral syndrome.  Patient awake and alert initially, reassuring. Cardiac 120 sinus tach abnormal pulse ox 97% room air normal  Amount and/or Complexity of Data Reviewed External Data Reviewed: notes. Labs: ordered. Decision-making details documented in ED Course. Radiology: ordered and independent interpretation performed. Decision-making details documented in ED Course. ECG/medicine tests: ordered and independent interpretation performed. Decision-making details documented in ED Course.  Risk Prescription drug management. Decision regarding hospitalization. Diagnosis or treatment significantly  limited by social determinants of health.  Initial lactic acid elevated, 1 leukocytosis as well patient meets sepsis criteria broad-spectrum antibiotics provided.   Update: Patient feeling somewhat better,  has received initial fluids.  Heart rate remains elevated.  Update: Urinalysis without obvious infection, though notable ketones and given his anion gap, initial lactic acidosis, tachycardia, some consideration SIRS may be secondary to substantial dehydration versus viral syndrome, though infection given he is relatively immobilized status, prior episodes of sepsis is considered.    3:44 PM  Patient improved, lactic acid level has cleared, heart rate now 110, patient admitted for further monitoring, management.  CRITICAL CARE Performed by: Lamar Salen Total critical care time: 35 minutes Critical care time was exclusive of separately billable procedures and treating other patients. Critical care was necessary to treat or prevent imminent or life-threatening deterioration. Critical care was time spent personally by me on the following activities: development of treatment plan with patient and/or surrogate as well as nursing, discussions with consultants, evaluation of patient's response to treatment, examination of patient, obtaining history from patient or surrogate, ordering and performing treatments and interventions, ordering and review of laboratory studies, ordering and review of radiographic studies, pulse oximetry and re-evaluation of patient's condition.   Final diagnoses:  SIRS (systemic inflammatory response syndrome) [facility])    ED Discharge Orders     None          Salen Lamar, MD 03/14/24 1544  "

## 2024-03-14 NOTE — Assessment & Plan Note (Signed)
 Versus sepsis unknown source.  Presenting with tachycardia, tachypnea, leukocytosis.  Lactic acidosis 2.8 resolved with IV fluids in the ED.   Patient presenting with nausea vomiting since yesterday evening, could be viral GI illness.  Chest x-ray no acute findings.  Urinalysis negative for infection.  Treated per sepsis protocol in the ED with IV vancomycin  and Rocephin , IV fluids. -- Follow blood cultures x 2 -- Follow pending viral PCR for COVID, flu, RSV --Continue empiric IV antibiotics for now with IV vancomycin  and Rocephin  --Monitor fever curve, CBC --Monitor clinically for signs or symptoms of infection --Telemetry

## 2024-03-14 NOTE — Assessment & Plan Note (Signed)
 Resume home gabapentin 

## 2024-03-14 NOTE — Assessment & Plan Note (Signed)
 Home regimen appears to be metformin , Trulicity , Farxiga .  Last hemoglobin A1c 8.2% in November. --Resume Farxiga  --Hold metformin  and Trulicity  --Sliding scale NovoLog  --Titrate regimen as needed for inpatient goal 140-180

## 2024-03-14 NOTE — Assessment & Plan Note (Signed)
 Unclear cause, acute onset last night, potential viral etiology, differential includes PUD, gastritis.   --Supportive care  --IV antiemetics --IV fluids until tolerating oral intake --IV PPI

## 2024-03-14 NOTE — Sepsis Progress Note (Signed)
 Elink will follow per sepsis protocol.

## 2024-03-14 NOTE — H&P (Signed)
 "  Telemedicine History and Physical    Patient: Dustin Lozano FMW:[id number] DOB: [birth year] DOA: 03/14/2024 DOS: the patient was seen and examined on 03/14/2024 PCP: Emerick Avelina POUR, PA-C  Patient coming from: Home  Referring Provider: Dr. Garrick, MD Telemedicine Provider: Burnard Cunning, DO Provider  Location: [place], [place] Patient Location: [facility] Referring Diagnosis: SIRS vs Sepsis unknown source Patient Name and DOB verified: Dustin Lozano, 07/15/1960 Patient consented to Telemedicine Evaluation: Yes RN virtual assistant: Thersia Simmering, RN Video encounter time and date: 03/14/2024 3:03 PM   Chief Complaint:  Chief Complaint  Patient presents with   Nausea   Emesis   Fever   HPI: Dustin Lozano is a 64 y.o. male with medical history significant of chronic systolic heart failure, non-insulin  dependent type 2 diabetes, hypertension, bilateral BKA, CAD, GERD, HLD and diabetic neuropathy who presented to [facility] for evaluation of feeling septic, with nausea and vomiting similar to what he experienced when admitted for sepsis 3 months ago.  He reports nausea and vomiting that started yesterday, he waited overnight to see if he felt better and decided to come in for evaluation today due to concern for sepsis.  He reports nausea today when sitting up but is no longer vomiting.  From time of onset around 5 PM yesterday through earlier this morning he could not count the number of episodes of vomiting.  No blood seen in vomit.  He reports having hot and cold spells, has not taken temperature.  He also reports generalized weakness, cannot tolerate getting up or doing much of anything including getting himself dressed to come in this morning which he states took 4 hours.  No dizziness or lightheadedness just felt like his knees were weak and he does ambulate with by bilateral prosthetics.  He reports having some heartburn earlier today but no other abdominal pain.  No diarrhea.   Has had minimal p.o. intake.  No known sick contacts.  Does have constant runny nose but no sore throat or congestion to suggest upper respiratory infection.  He does report some pain with urination and making less urine than normal that he expects is from not being able to keep down liquids.    ED course --  Initial vitals-temp 97.9 F, HR 122, RR 20, BP 167/109, SpO2 97% on room air. Labs obtained including CMP and CBC were notable for chloride 94, glucose 281, BUN 28, creatinine 1.33 (from baseline 0.98), anion gap 20.  Lipase was normal 44.  LFTs normal.  WBC 15.1. Initial lactic acid elevated at 2.8, improved to 1.8 after IV fluids. Urinalysis was negative for any signs of infection, showed ketonuria, glucosuria and specific gravity 1.025. Imaging-portable chest x-ray showed no acute findings. Blood cultures x 2 were obtained. Viral PCR for COVID/flu/RSV is ordered and pending at time of admission.  Patient was treated in the ED with IV fluids 1 L LR, 1 L NS, IV vancomycin  and Rocephin  empirically for presumed sepsis, 4 mg IV Zofran , 1 g Carafate , 20 mg IV Pepcid .  Patient is being admitted to telemetry for further evaluation and management of presumed sepsis of unclear source versus SIRS with dehydration as outlined in detail below.      Review of Systems: As mentioned in the history of present illness. All other systems reviewed and are negative.  Past Medical History:  Diagnosis Date   Allergy    Arthritis    CHF (congestive heart failure) (HCC)    Systolic -  LVEF 55-60  (06/2012)    Coronary artery disease    Nonobstructive disease per cath 08/2011   Diabetes mellitus    GERD (gastroesophageal reflux disease)    HTN (hypertension)    Hyperlipidemia    Intractable nausea and vomiting 12/21/2023   Neuromuscular disorder (HCC)    Nerve damage   Past Surgical History:  Procedure Laterality Date   APPENDECTOMY     BELOW KNEE LEG AMPUTATION Bilateral    BROW LIFT Bilateral  04/21/2022   Procedure: UPPER EYELID BLEPHAROPLASTY;  Surgeon: Luciano Standing, MD;  Location: [facility];  Service: ENT;  Laterality: Bilateral;   COLONOSCOPY     LEFT AND RIGHT HEART CATHETERIZATION WITH CORONARY ANGIOGRAM N/A 08/17/2011   Procedure: LEFT AND RIGHT HEART CATHETERIZATION WITH CORONARY ANGIOGRAM;  Surgeon: Ozell Fell, MD;  Location: [facility] CATH LAB;  Service: Cardiovascular;  Laterality: N/A;   POLYPECTOMY     Social History:  reports that he quit smoking about 18 years ago. His smoking use included cigarettes. He started smoking about 33 years ago. He has a 10.5 pack-year smoking history. He has never used smokeless tobacco. He reports current alcohol use. He reports that he does not use drugs.  Allergies[1]  Family History  Problem Relation Age of Onset   Diabetes Father    Heart failure Father    Cancer Mother        lymphoma   Diabetes Mother    Colon cancer Neg Hx    Esophageal cancer Neg Hx    Rectal cancer Neg Hx    Stomach cancer Neg Hx     Prior to Admission medications  Medication Sig Start Date End Date Taking? Authorizing Provider  ACCU-CHEK FASTCLIX LANCETS MISC 1 Device by Does not apply route 2 (two) times daily. 08/18/16   Nafziger, Darleene, NP  acetaminophen  (TYLENOL ) 325 MG tablet Take 650 mg by mouth every 6 (six) hours as needed. Patient not taking: Reported on 12/21/2023    [provider]  alum & mag hydroxide-simeth (MAALOX/MYLANTA) 200-200-20 MG/5ML suspension Take 15 mLs by mouth every 4 (four) hours as needed for indigestion or heartburn. 12/23/23   Cheryle Page, MD  amLODipine  (NORVASC ) 10 MG tablet Take 1 tablet (10 mg total) by mouth daily. 02/15/24     aspirin  EC 81 MG tablet Take 81 mg by mouth daily.    [provider]  atorvastatin  (LIPITOR) 20 MG tablet Take 1 tablet (20 mg total) by mouth daily. 02/28/24     Blood Glucose Monitoring Suppl (ACCU-CHEK GUIDE) w/Device KIT Use to check blood sugar 3 times daily  as directed 09/28/23     carvedilol  (COREG ) 12.5 MG tablet Take 1 tablet (12.5 mg total) by mouth in the morning and 1 tablet (12.5 mg total) in the evening. Take with meals. 06/18/22     dapagliflozin  propanediol (FARXIGA ) 5 MG TABS tablet Take 1 tablet (5 mg total) by mouth daily. 01/04/24     Dulaglutide  (TRULICITY ) 3 MG/0.5ML SOAJ Inject 3 mg into the skin once a week. 08/18/23     FREESTYLE LITE test strip USE ONE STRIP TO CHECK GLUCOSE TWICE DAILY.    Webb, Padonda B, FNP  gabapentin  (NEURONTIN ) 800 MG tablet Take 1 tablet (800 mg total) by mouth 2 (two) times daily. 09/07/23     glucose blood (ACCU-CHEK GUIDE TEST) test strip Test 2 (two) times a day. Use as instructed 09/29/23     icosapent  Ethyl (VASCEPA ) 1 g capsule Take 2 capsules (  2 g total) by mouth 2 (two) times daily. 12/05/23     Insulin  Pen Needle (BD PEN NEEDLE NANO U/F) 32G X 4 MM MISC Test blood sugars as directed. Patient not taking: Reported on 07/10/2018 05/09/15   Merna Huxley, NP  lisinopril  (ZESTRIL ) 40 MG tablet Take 1 tablet (40 mg total) by mouth daily. 06/18/22     lisinopril  (ZESTRIL ) 40 MG tablet Take 1 tablet (40 mg total) by mouth daily. 02/13/24     metFORMIN  (GLUCOPHAGE -XR) 500 MG 24 hr tablet Take 3 tablets (1,500 mg total) by mouth See admin instructions. 2 tabs in AM and 1 tab in PM 12/23/23   Cheryle Page, MD  metFORMIN  (GLUCOPHAGE -XR) 500 MG 24 hr tablet Take 2 tablets (1,000 mg total) by mouth 2 (two) times daily. 01/04/24     ondansetron  (ZOFRAN ) 4 MG tablet Take 1 tablet (4 mg total) by mouth every 8 (eight) hours as needed for nausea or vomiting. 12/23/23   Cheryle Page, MD  pantoprazole  (PROTONIX ) 40 MG tablet Take 1 tablet (40 mg total) by mouth 2 (two) times daily before a meal. 12/23/23 01/22/24  Cheryle Page, MD  tamsulosin  (FLOMAX ) 0.4 MG CAPS capsule Take 1 capsule (0.4 mg total) by mouth daily. 03/09/24     zolpidem  (AMBIEN ) 10 MG tablet Take 10 mg by mouth at bedtime as needed for sleep.    [provider]  zolpidem  (AMBIEN ) 10 MG tablet Take 1 tablet (10 mg total) by mouth at bedtime as needed for sleep 03/09/24       Physical Exam: Vitals:   03/14/24 1051 03/14/24 1232 03/14/24 1427 03/14/24 1500  BP: (!) 159/121  (!) 179/93   Pulse: (!) 116  (!) 117   Resp: 18  14   Temp:  98.6 F (37 C) 98 F (36.7 C)   TempSrc:  Oral Oral   SpO2: 97%  97%   Weight:    84.2 kg   Bedside physical exam was performed by RN listed above. Below exam findings are based on their in person physical exam findings and my observations during virtual encounter.  General exam: awake, alert, no acute distress HEENT: Voice clear, hearing grossly normal  Respiratory system: CTAB, no wheezes, rales or rhonchi, normal respiratory effort.  On room air Cardiovascular system: normal S1/S2, tachycardic, regular rhythm Gastrointestinal system: soft, NT, ND, +bowel sounds.  Umbilical hernia noted Central nervous system: A&O x 3. no gross focal neurologic deficits, normal speech Extremities: Bilateral BKA's healthy appearing with no acute skin changes Skin: dry, intact, face show and upper chest flushing  Psychiatry: normal mood, congruent affect, judgement and insight appear normal   Data Reviewed:  As reviewed in detail above  Assessment and Plan:  * SIRS (systemic inflammatory response syndrome) (HCC) Versus sepsis unknown source.  Presenting with tachycardia, tachypnea, leukocytosis.  Lactic acidosis 2.8 resolved with IV fluids in the ED.   Patient presenting with nausea vomiting since yesterday evening, could be viral GI illness.  Chest x-ray no acute findings.  Urinalysis negative for infection.  Treated per sepsis protocol in the ED with IV vancomycin  and Rocephin , IV fluids. -- Follow blood cultures x 2 -- Follow pending viral PCR for COVID, flu, RSV --Continue empiric IV antibiotics for now with IV vancomycin  and Rocephin  --Monitor fever curve, CBC --Monitor clinically for signs or  symptoms of infection --Telemetry  Nausea and vomiting Unclear cause, acute onset last night, potential viral etiology, differential includes PUD, gastritis.   --Supportive care  --  IV antiemetics --IV fluids until tolerating oral intake --IV PPI  AKI (acute kidney injury) Creatinine on admission 1.33, up from 0.98 baseline in November 2025.  In the setting of nausea vomiting and poor p.o. intake, likely prerenal azotemia.  Given 2 L IV fluids in the ED. -- Continue maintenance IV fluids until tolerating oral intake --Monitor BMP --Renally dose meds and avoid nephrotoxins  Chronic systolic heart failure (HCC) Compensated on admission. Echo in November 2024 at [facility] showed normal EF 55-60%, recovered from 25-30% back in 2013. --Resume home Coreg , Farxiga  -- Hold lisinopril  for AKI --Monitor volume status closely with IV hydration  Diabetes mellitus type 2, noninsulin dependent (HCC) Home regimen appears to be metformin , Trulicity , Farxiga .  Last hemoglobin A1c 8.2% in November. --Resume Farxiga  --Hold metformin  and Trulicity  --Sliding scale NovoLog  --Titrate regimen as needed for inpatient goal 140-180  Hypertension BP is elevated in the ED with systolic 150s to 829d. -- Resume home Coreg , amlodipine  --Hold lisinopril  due to AKI --As needed IV hydralazine   S/P bilateral below knee amputation (HCC) No acute issues. Uses bilateral prosthetics.  Diabetic neuropathy (HCC) Resume home gabapentin       Advance Care Planning:   Code Status: Full Code   Consults: None  Family Communication: None present during virtual admission encounter  Severity of Illness: The appropriate patient status for this patient is OBSERVATION. Observation status is judged to be reasonable and necessary in order to provide the required intensity of service to ensure the patient's safety. The patient's presenting symptoms, physical exam findings, and initial radiographic and laboratory data in the  context of their medical condition is felt to place them at decreased risk for further clinical deterioration. Furthermore, it is anticipated that the patient will be medically stable for discharge from the hospital within 2 midnights of admission.   Author: Burnard DELENA Cunning, DO 03/14/2024 3:52 PM  For on call review [email or web address].      [1] No Known Allergies  "

## 2024-03-14 NOTE — Assessment & Plan Note (Signed)
 BP is elevated in the ED with systolic 150s to 829d. -- Resume home Coreg , amlodipine  --Hold lisinopril  due to AKI --As needed IV hydralazine 

## 2024-03-14 NOTE — ED Triage Notes (Signed)
 Patient states I think I'm septic, dysuria/n/v/fever since yesterday. Patient is alert and oriented x 4. Airway patent, respirations even and unlabored. Skin normal, warm and dry.

## 2024-03-14 NOTE — ED Notes (Signed)
 Gave pt meds & sip of water

## 2024-03-14 NOTE — Progress Notes (Signed)
 Pharmacy Antibiotic Note  Dustin Lozano is a 63 y.o. male admitted on 03/14/2024 with sepsis.  Pharmacy has been consulted for Vancomycin  dosing.  Active Problem(s): dysuria, N/V, fever, myalgia, fatigue, poor po intake  PMH: sHF, DM, HTN, BL BKA, CAD, GERD, HLD, diabetic neuropathy, arthritis,  Significant events: admitted for sepsis 3 months ago.   ID: Sepsis, CXR negative, UA negative - Afebrile, WBC 15.1, LA 2.8, Scr 1.33  Vanco 2/4>> Rocephin  2/4>>  Plan: Vanco 1g IV x 1 in ED Vanco 750mg  IV x 1 in ED Vancomycin  1750 mg IV Q 24 hrs. Goal AUC 400-550. Expected AUC: 501 SCr used: 1.33    Weight: 84.2 kg (185 lb 11.2 oz)  Temp (24hrs), Avg:98.2 F (36.8 C), Min:97.9 F (36.6 C), Max:98.6 F (37 C)  Recent Labs  Lab 03/14/24 0930 03/14/24 0934 03/14/24 1147  WBC  --  15.1*  --   CREATININE  --  1.33*  --   LATICACIDVEN 2.8*  --  1.8    Estimated Creatinine Clearance: 64 mL/min (A) (by C-G formula based on SCr of 1.33 mg/dL (H)).    Allergies[1]  [name], PharmD, BCPS Clinical Staff Pharmacist  [facility] 03/14/2024 3:54 PM     [1] No Known Allergies

## 2024-03-14 NOTE — Assessment & Plan Note (Signed)
 Creatinine on admission 1.33, up from 0.98 baseline in November 2025.  In the setting of nausea vomiting and poor p.o. intake, likely prerenal azotemia.  Given 2 L IV fluids in the ED. -- Continue maintenance IV fluids until tolerating oral intake --Monitor BMP --Renally dose meds and avoid nephrotoxins

## 2024-03-15 ENCOUNTER — Other Ambulatory Visit: Payer: Self-pay

## 2024-03-15 ENCOUNTER — Encounter (HOSPITAL_COMMUNITY): Payer: Self-pay | Admitting: Family Medicine

## 2024-03-15 DIAGNOSIS — E1110 Type 2 diabetes mellitus with ketoacidosis without coma: Secondary | ICD-10-CM | POA: Diagnosis present

## 2024-03-15 LAB — BASIC METABOLIC PANEL WITH GFR
Anion gap: 12 (ref 5–15)
BUN: 29 mg/dL — ABNORMAL HIGH (ref 8–23)
CO2: 25 mmol/L (ref 22–32)
Calcium: 8.7 mg/dL — ABNORMAL LOW (ref 8.9–10.3)
Chloride: 104 mmol/L (ref 98–111)
Creatinine, Ser: 1.1 mg/dL (ref 0.61–1.24)
GFR, Estimated: >60 mL/min
Glucose, Bld: 163 mg/dL — ABNORMAL HIGH (ref 70–99)
Potassium: 3.6 mmol/L (ref 3.5–5.1)
Sodium: 141 mmol/L (ref 135–145)

## 2024-03-15 LAB — CBC
HCT: 39.8 % (ref 39.0–52.0)
Hemoglobin: 13.2 g/dL (ref 13.0–17.0)
MCH: 29 pg (ref 26.0–34.0)
MCHC: 33.2 g/dL (ref 30.0–36.0)
MCV: 87.5 fL (ref 80.0–100.0)
Platelets: 284 10*3/uL (ref 150–400)
RBC: 4.55 MIL/uL (ref 4.22–5.81)
RDW: 13.1 % (ref 11.5–15.5)
WBC: 11.2 10*3/uL — ABNORMAL HIGH (ref 4.0–10.5)
nRBC: 0 % (ref 0.0–0.2)

## 2024-03-15 LAB — CBG MONITORING, ED: Glucose-Capillary: 174 mg/dL — ABNORMAL HIGH (ref 70–99)

## 2024-03-15 LAB — GLUCOSE, CAPILLARY
Glucose-Capillary: 154 mg/dL — ABNORMAL HIGH (ref 70–99)
Glucose-Capillary: 130 mg/dL — ABNORMAL HIGH (ref 70–99)
Glucose-Capillary: 118 mg/dL — ABNORMAL HIGH (ref 70–99)

## 2024-03-15 LAB — BETA-HYDROXYBUTYRIC ACID: Beta-Hydroxybutyric Acid: 0.4 mmol/L — ABNORMAL HIGH (ref 0.05–0.27)

## 2024-03-15 LAB — MAGNESIUM: Magnesium: 2.3 mg/dL (ref 1.7–2.4)

## 2024-03-15 MED ORDER — FERROUS SULFATE 325 (65 FE) MG PO TABS
325.0000 mg | ORAL_TABLET | Freq: Every day | ORAL | Status: DC
  Administered 2024-03-15 – 2024-03-16 (×2): 325 mg via ORAL
  Filled 2024-03-15 (×2): qty 1

## 2024-03-15 NOTE — Progress Notes (Signed)
" °   03/15/24 1211  TOC ED Mini Assessment  TOC Time spent with patient (minutes): 15  PING Used in [facility] Assessment Yes  Admission or Readmission Diverted Yes   Transition of Care [facility]) - Emergency Department Mini Assessment   Patient Details  Name: Dustin Lozano MRN: [id number] Date of Birth: 02-11-1960  Transition of Care [facility]) CM/SW Contact:    Camelia JONETTA Cary, RN Phone Number: 03/15/2024, 12:12 PM   Clinical Narrative:  RNCM spoke to pt. Regarding d/c plan. Pt. Wil return home at d/c.    ED Mini Assessment:                  Patient Contact and Communications        ,                 Admission diagnosis:  SIRS (systemic inflammatory response syndrome) (HCC) [R65.10] DKA (diabetic ketoacidosis) (HCC) [E11.10] Patient Active Problem List   Diagnosis Date Noted   DKA (diabetic ketoacidosis) (HCC) 03/15/2024   SIRS (systemic inflammatory response syndrome) (HCC) 03/14/2024   Severe hyperglycemia due to diabetes mellitus (HCC) 12/21/2023   AKI (acute kidney injury) 12/21/2023   Lactic acidosis 12/21/2023   Nausea and vomiting 12/21/2023   Epigastric pain 12/21/2023   High anion gap metabolic acidosis 12/21/2023   Hypertensive urgency 12/21/2023   Uncontrolled type 2 diabetes mellitus with hyperglycemia, without long-term current use of insulin  (HCC) 12/21/2023   Uncontrolled type 2 diabetes mellitus with hyperglycemia (HCC) 12/20/2023   S/P bilateral below knee amputation (HCC) 04/26/2018   Diabetic neuropathy (HCC) 10/22/2015   Chronic systolic heart failure (HCC) 08/24/2011   Hypertension 08/14/2011   Diabetes mellitus type 2, noninsulin dependent (HCC) 08/14/2011   PCP:  Emerick Avelina POUR, PA-C Pharmacy:   [facility] [address] [place] [address] Phone: [phone] Fax: [phone]   "

## 2024-03-15 NOTE — ED Notes (Signed)
 Entered the room to obtain labs and patient was laying in the bed resting. Patient was able to get about hour of sleep finally. Patient had no needs expressed at this time.

## 2024-03-15 NOTE — Progress Notes (Signed)
" °  Progress Note   Patient: Dustin Lozano FMW:[id number] DOB: November 11, 1960 DOA: 03/14/2024     0 DOS: the patient was seen and examined on 03/15/2024 at 11:10AM      Brief hospital course: 64 y.o. M with sCHF/NICM recovered EF 50-55%, bilateral BKA, Dm and HTN who presented with malaise.  Found to have metabolic acidosis, ?infection.          Assessment and Plan: SIRS Unclear cause.  Checks x-ray showed no infiltrate.  Urinalysis not suggestive of UTI.  No focal abdominal pain, focal skin lesions. - Continue empiric antibiotics, Rocephin  and vancomycin  - Follow blood cultures, de-escalate    Euglycemic DKA Presented with mild hyperglycemia, elevated anion gap and beta hydroxybutyrate.  Given fluids and subcutaneous insulin  overnight, gap closed. -Hold Farxiga  -Continue sliding scale corrections -Trend BMP  AKI (acute kidney injury) Baseline Cr 1.0, here was >1.3 on admission.  Improved with fluids - Trend BMP - Hold Farxiga  - Hold lisinopril    Chronic systolic heart failure (HCC) History of nonischemic cardiomyopathy, EF 25% since recovered.  No evidence of fluid overload, not on diuretic at baseline - Continue carvedilol   Diabetes mellitus type 2, noninsulin dependent (HCC) Neuropathy Uncontrolled, A1c 8.2% last November.  Hyperglycemic here. - Hold Farxiga , metformin , Trulicity  - Sliding scale correction insulin  - Continue gabapentin   Hypertension Blood pressure elevated - Continue amlodipine , carvedilol  - Resume lisinopril  when able  S/P bilateral below knee amputation (HCC) Peripheral vascular disease -Continue aspirin , Lipitor  BPH - Continue Flomax       Subjective: Feeling gradually better.  Able to eat a little bit today.  No fever or confusion.     Physical Exam: BP (!) 153/92 (BP Location: Right Arm)   Pulse 83   Temp 98 F (36.7 C)   Resp 15   Ht 6' (1.829 m)   Wt 82 kg   SpO2 96%   BMI 24.52 kg/m   Adult male, sitting up in bed,  interactive and appropriate RRR, no murmurs, no peripheral edema Respiratory rate normal, lungs clear without rales or wheezes Abdomen soft, no tenderness palpation or guarding, no ascites or distention Attention normal, affect pleasant, judgment and insight appear normal, face symmetric, speech fluent    Data Reviewed: Basic metabolic panel shows elevated BUN to creatinine ratio, normal renal function, potassium and anion gap normal CBC shows resolving leukocytosis    Family Communication:     Disposition: Status is: Observation         Author: Lonni SHAUNNA Dalton, MD 03/15/2024 5:44 PM  For on call review [email or web address].    "

## 2024-03-15 NOTE — ED Notes (Signed)
 Patient was sitting up in bed unable to sleep well. Patient denied needing anything at that time.

## 2024-03-15 NOTE — Hospital Course (Addendum)
 64 y.o. M with sCHF/NICM recovered EF 50-55%, bilateral BKA, Dm and HTN who presented with malaise.  Found to have metabolic acidosis, ?infection.    SIRS    Euglycemic DKA   AKI (acute kidney injury) Baseline Cr 1.0, here was >1.3 on admission.     Chronic systolic heart failure (HCC)   Diabetes mellitus type 2, noninsulin dependent (HCC)   Hypertension  S/P bilateral below knee amputation (HCC)

## 2024-03-15 NOTE — Care Management Obs Status (Signed)
 MEDICARE OBSERVATION STATUS NOTIFICATION   Patient Details  Name: Dustin Lozano MRN: [id number] Date of Birth: 10-02-1960   Medicare Observation Status Notification Given:  Yes    Camelia JONETTA Cary, RN 03/15/2024, 12:11 PM

## 2024-03-15 NOTE — Plan of Care (Signed)
" °  Problem: Education: Goal: Ability to describe self-care measures that may prevent or decrease complications (Diabetes Survival Skills Education) will improve Outcome: Progressing Goal: Individualized Educational Video(s) Outcome: Progressing   Problem: Coping: Goal: Ability to adjust to condition or change in health will improve Outcome: Progressing   Problem: Coping: Goal: Ability to adjust to condition or change in health will improve Outcome: Progressing   Problem: Fluid Volume: Goal: Ability to maintain a balanced intake and output will improve Outcome: Progressing   Problem: Health Behavior/Discharge Planning: Goal: Ability to identify and utilize available resources and services will improve Outcome: Progressing Goal: Ability to manage health-related needs will improve Outcome: Progressing   Problem: Metabolic: Goal: Ability to maintain appropriate glucose levels will improve Outcome: Progressing   Problem: Nutritional: Goal: Maintenance of adequate nutrition will improve Outcome: Progressing Goal: Progress toward achieving an optimal weight will improve Outcome: Progressing   Problem: Skin Integrity: Goal: Risk for impaired skin integrity will decrease Outcome: Progressing   Problem: Tissue Perfusion: Goal: Adequacy of tissue perfusion will improve Outcome: Progressing   Problem: Education: Goal: Knowledge of General Education information will improve Description: Including pain rating scale, medication(s)/side effects and non-pharmacologic comfort measures Outcome: Progressing   "

## 2024-03-16 LAB — CBC
HCT: 43.3 % (ref 39.0–52.0)
Hemoglobin: 14.4 g/dL (ref 13.0–17.0)
MCH: 29 pg (ref 26.0–34.0)
MCHC: 33.3 g/dL (ref 30.0–36.0)
MCV: 87.1 fL (ref 80.0–100.0)
Platelets: 294 10*3/uL (ref 150–400)
RBC: 4.97 MIL/uL (ref 4.22–5.81)
RDW: 12.4 % (ref 11.5–15.5)
WBC: 10.3 10*3/uL (ref 4.0–10.5)
nRBC: 0 % (ref 0.0–0.2)

## 2024-03-16 LAB — COMPREHENSIVE METABOLIC PANEL WITH GFR
ALT: 35 U/L (ref 0–44)
AST: 56 U/L — ABNORMAL HIGH (ref 15–41)
Albumin: 4.2 g/dL (ref 3.5–5.0)
Alkaline Phosphatase: 84 U/L (ref 38–126)
Anion gap: 13 (ref 5–15)
BUN: 23 mg/dL (ref 8–23)
CO2: 22 mmol/L (ref 22–32)
Calcium: 9.2 mg/dL (ref 8.9–10.3)
Chloride: 101 mmol/L (ref 98–111)
Creatinine, Ser: 1.1 mg/dL (ref 0.61–1.24)
GFR, Estimated: >60 mL/min
Glucose, Bld: 165 mg/dL — ABNORMAL HIGH (ref 70–99)
Potassium: 3.9 mmol/L (ref 3.5–5.1)
Sodium: 136 mmol/L (ref 135–145)
Total Bilirubin: 0.7 mg/dL (ref 0.0–1.2)
Total Protein: 7 g/dL (ref 6.5–8.1)

## 2024-03-16 LAB — GLUCOSE, CAPILLARY: Glucose-Capillary: 147 mg/dL — ABNORMAL HIGH (ref 70–99)

## 2024-03-16 LAB — CULTURE, BLOOD (ROUTINE X 2)
Culture: NO GROWTH
Special Requests: ADEQUATE

## 2024-03-16 LAB — BETA-HYDROXYBUTYRIC ACID: Beta-Hydroxybutyric Acid: 0.68 mmol/L — ABNORMAL HIGH (ref 0.05–0.27)

## 2024-03-16 NOTE — Plan of Care (Signed)
" °  Problem: Coping: Goal: Ability to adjust to condition or change in health will improve Outcome: Progressing   Problem: Fluid Volume: Goal: Ability to maintain a balanced intake and output will improve Outcome: Progressing   Problem: Nutritional: Goal: Maintenance of adequate nutrition will improve Outcome: Progressing   Problem: Activity: Goal: Risk for activity intolerance will decrease Outcome: Progressing   Problem: Coping: Goal: Level of anxiety will decrease Outcome: Progressing   Problem: Elimination: Goal: Will not experience complications related to urinary retention Outcome: Progressing   "

## 2024-03-16 NOTE — Discharge Summary (Signed)
 " Physician Discharge Summary   Patient: Dustin Lozano MRN: [id number] DOB: 14-Jul-1960  Admit date:     03/14/2024  Discharge date: 03/16/24  Discharge Physician: Lonni SHAUNNA Dalton   PCP: Emerick Avelina POUR, PA-C     Recommendations at discharge:  Follow up with PCP Avelina Emerick in 1 week for malaise Avelina Emerick: Please repeat BMP, UA and beta-hydroxybutyric acid off Farxiga  If anion gap, urine ketones or BHOB still elevated despite holding Farxiga , refer to Endocrinology If no ketoacidosis, permanently stop Farxiga  and provide alternative antiglycemics     Discharge Diagnoses: Principal Problem:   SIRS (systemic inflammatory response syndrome) (HCC) Active Problems:   AKI (acute kidney injury)   Nausea and vomiting   Hypertension   Diabetes mellitus type 2, noninsulin dependent (HCC)   Chronic systolic heart failure (HCC)   Diabetic neuropathy (HCC)   S/P bilateral below knee amputation (HCC)   DKA (diabetic ketoacidosis) [facility])       Hospital Course: 64 y.o. M with sCHF/NICM recovered EF 50-55%, bilateral BKA, DM and HTN who presented with malaise.  Found to have metabolic acidosis, ?infection.         SIRS Presented with nausea and vomiting, weakness, tachycardia, leukocytosis.  Chest x-ray showed no infiltrate, urinalysis suggested no UTI.  Blood cultures obtained, no growth.  He was treated with 48 hours empiric antibiotics, but given no source of infection, these were stopped.  Suspect he had a viral URI.        Euglycemic DKA Presented with mild hyperglycemia, elevated anion gap and beta hydroxybutyrate in the setting of Farxiga  use.  Review of his chart over the last several months, shows that he has had numerous episodes of systemic symptoms similar to DKA.   In the hospital he has had persistent elevated beta hydroxybutyrate, and I suspect he is not tolerating Farxiga .  I recommend holding this, close follow-up with PCP for repeat testing of  anion gap, urine ketones, and beta hydroxybutyric acid.   If these resolve off of the Farxiga , I recommend permanently stopping Farxiga  and choosing an alternative diabetes medication.  If he continues to have ketoacidosis despite stopping Farxiga , would recommend referral to endocrinology.    AKI (acute kidney injury) Baseline Cr 1.0, here was >1.3 on admission.  Improved with fluids   Chronic systolic heart failure (HCC) History of nonischemic cardiomyopathy, EF 25% since recovered.  No evidence of fluid overload, not on diuretic at baseline   Diabetes mellitus type 2, noninsulin dependent (HCC) Neuropathy Uncontrolled, A1c 8.2% last November.  See above               The [place]  Controlled Substances Registry was reviewed for this patient prior to discharge.    DISCHARGE MEDICATION: Allergies as of 03/16/2024   No Known Allergies      Medication List     STOP taking these medications    Farxiga  5 MG Tabs tablet Generic drug: dapagliflozin  propanediol   pantoprazole  40 MG tablet Commonly known as: Protonix        TAKE these medications    Accu-Chek FastClix Lancets Misc 1 Device by Does not apply route 2 (two) times daily.   Accu-Chek Guide w/Device Kit Use to check blood sugar 3 times daily as directed   amLODipine  10 MG tablet Commonly known as: NORVASC  Take 1 tablet (10 mg total) by mouth daily.   Antacid/[place] 400-400-40 MG/10ML suspension Generic drug: alum & mag hydroxide-simeth Take 15 mLs by mouth every 4 (four)  hours as needed for indigestion or heartburn.   aspirin  EC 81 MG tablet Take 81 mg by mouth daily.   atorvastatin  20 MG tablet Commonly known as: LIPITOR Take 1 tablet (20 mg total) by mouth daily. What changed: when to take this   carvedilol  12.5 MG tablet Commonly known as: COREG  Take 1 tablet (12.5 mg total) by mouth in the morning and 1 tablet (12.5 mg total) in the evening. Take with meals.   ferrous sulfate  325  (65 FE) MG tablet Take 325 mg by mouth daily with breakfast.   FREESTYLE LITE test strip Generic drug: glucose blood USE ONE STRIP TO CHECK GLUCOSE TWICE DAILY.   Accu-Chek Guide Test test strip Generic drug: glucose blood Test 2 (two) times a day. Use as instructed   gabapentin  800 MG tablet Commonly known as: NEURONTIN  Take 1 tablet (800 mg total) by mouth 2 (two) times daily.   Insulin  Pen Needle 32G X 4 MM Misc Commonly known as: BD Pen Needle Nano U/F Test blood sugars as directed.   lisinopril  40 MG tablet Commonly known as: ZESTRIL  Take 1 tablet (40 mg total) by mouth daily. What changed: Another medication with the same name was removed. Continue taking this medication, and follow the directions you see here.   magnesium oxide 400 (240 Mg) MG tablet Commonly known as: MAG-OX Take 400 mg by mouth daily.   Metamucil 3 in 1 Daily Fiber 400 MG Caps Generic drug: Psyllium Take 800 mg by mouth See admin instructions. Take 800 mg by mouth in the morning and evening   metFORMIN  500 MG 24 hr tablet Commonly known as: GLUCOPHAGE -XR Take 2 tablets (1,000 mg total) by mouth 2 (two) times daily. What changed: Another medication with the same name was removed. Continue taking this medication, and follow the directions you see here.   omeprazole  40 MG capsule Commonly known as: PRILOSEC Take 40 mg by mouth daily before breakfast.   ondansetron  4 MG tablet Commonly known as: Zofran  Take 1 tablet (4 mg total) by mouth every 8 (eight) hours as needed for nausea or vomiting.   One-A-Day Mens 50+ Tabs Take 1 tablet by mouth daily with breakfast.   Hair Skin & Nails Tabs Take 1 tablet by mouth See admin instructions. Take 1 tablet by mouth in the morning and evening   tamsulosin  0.4 MG Caps capsule Commonly known as: FLOMAX  Take 1 capsule (0.4 mg total) by mouth daily.   Trulicity  3 MG/0.5ML Soaj Generic drug: Dulaglutide  Inject 3 mg into the skin once a week. What  changed: when to take this   Tylenol  8 Hour Arthritis Pain 650 MG CR tablet Generic drug: acetaminophen  Take 650 mg by mouth See admin instructions. Take 650 mg by mouth in the morning and evening   Vascepa  1 g capsule Generic drug: icosapent  Ethyl Take 2 capsules (2 g total) by mouth 2 (two) times daily.   Zinc 50 MG Tabs Take 50 mg by mouth daily.   zolpidem  10 MG tablet Commonly known as: AMBIEN  Take 10 mg by mouth at bedtime as needed for sleep.   zolpidem  10 MG tablet Commonly known as: AMBIEN  Take 1 tablet (10 mg total) by mouth at bedtime as needed for sleep        Follow-up Information     Emerick Avelina POUR, PA-C. Schedule an appointment as soon as possible for a visit in 1 week(s).   Specialty: Physician Assistant Contact information: [address], [address] [place]  [address] [phone]                 Discharge Instructions     Discharge instructions   Complete by: As directed    **IMPORTANT DISCHARGE INSTRUCTIONS**   From Dr. Jonel: You were admitted for dehydration  Here, we found that you had dehydration and vomiting, so we held your lisinopril , metformin , Trulicity  and Farxiga  and gave you IV fliuds  Thankfully, things appear all better  We did evaluate you for infection, but your chest x-ray showed no pneumonia, COVID and flu tests were negative, urine test showed no signs of infection, and blood cultures were normal.  I feel infection is very unlikely.    Resume all your home medicines except Farxiga  Eat a healthy diet Drink plenty of fluids Take hot tea and vitamin C for the laryngitis  Go see your primary doctor in 1 week   Increase activity slowly   Complete by: As directed        Discharge Exam: Filed Weights   03/14/24 1500 03/15/24 1316  Weight: 84.2 kg 82 kg    General: Pt is alert, awake, not in acute distress Cardiovascular: RRR, nl S1-S2, no murmurs appreciated.   No LE edema.   Respiratory: Normal  respiratory rate and rhythm.  CTAB without rales or wheezes. Abdominal: Abdomen soft and non-tender.  No distension or HSM.   Neuro/Psych: Strength symmetric in upper and lower extremities.  Judgment and insight appear normal.   Condition at discharge: good  The results of significant diagnostics from this hospitalization (including imaging, microbiology, ancillary and laboratory) are listed below for reference.   Imaging Studies: DG Chest Port 1 View Result Date: 03/14/2024 CLINICAL DATA:  Questionable sepsis. EXAM: PORTABLE CHEST 1 VIEW COMPARISON:  12/20/2023. FINDINGS: Trachea is midline. Heart size is accentuated by AP semi upright technique and somewhat low lung volumes. Lungs are clear. No pleural fluid. IMPRESSION: No acute findings. Electronically Signed   By: Newell Eke M.D.   On: 03/14/2024 09:13    Microbiology: Results for orders placed or performed during the hospital encounter of 03/14/24  Blood Culture (routine x 2)     Status: None (Preliminary result)   Collection Time: 03/14/24  9:14 AM   Specimen: BLOOD  Result Value Ref Range Status   Specimen Description   Final    BLOOD LEFT ANTECUBITAL Performed at [facility], [address], [place], [place] [address]    Special Requests   Final    BOTTLES DRAWN AEROBIC AND ANAEROBIC Blood Culture adequate volume Performed at [facility], [address], [place], [place] [address]    Culture   Final    NO GROWTH 2 DAYS Performed at [facility], [address], [place], [place] [address]    Report Status PENDING  Incomplete  Resp panel by RT-PCR (RSV, Flu A&B, Covid) Anterior Nasal Swab     Status: None   Collection Time: 03/14/24  8:45 PM   Specimen: Anterior Nasal Swab  Result Value Ref Range Status   SARS Coronavirus 2 by RT PCR NEGATIVE NEGATIVE Final    Comment: (NOTE) SARS-CoV-2 target nucleic acids are NOT DETECTED.  The SARS-CoV-2 RNA is generally detectable  in upper respiratory specimens during the acute phase of infection. The lowest concentration of SARS-CoV-2 viral copies this assay can detect is 138 copies/mL. A negative result does not preclude SARS-Cov-2 infection and should not be used as the sole basis for treatment or  other patient management decisions. A negative result may occur with  improper specimen collection/handling, submission of specimen other than nasopharyngeal swab, presence of viral mutation(s) within the areas targeted by this assay, and inadequate number of viral copies(<138 copies/mL). A negative result must be combined with clinical observations, patient history, and epidemiological information. The expected result is Negative.  Fact Sheet for Patients:  bloggercourse.com  Fact Sheet for Healthcare Providers:  seriousbroker.it  This test is no t yet approved or cleared by the [place]  FDA and  has been authorized for detection and/or diagnosis of SARS-CoV-2 by FDA under an Emergency Use Authorization (EUA). This EUA will remain  in effect (meaning this test can be used) for the duration of the COVID-19 declaration under Section 564(b)(1) of the Act, 21 U.S.C.section 360bbb-3(b)(1), unless the authorization is terminated  or revoked sooner.       Influenza A by PCR NEGATIVE NEGATIVE Final   Influenza B by PCR NEGATIVE NEGATIVE Final    Comment: (NOTE) The Xpert Xpress SARS-CoV-2/FLU/RSV plus assay is intended as an aid in the diagnosis of influenza from Nasopharyngeal swab specimens and should not be used as a sole basis for treatment. Nasal washings and aspirates are unacceptable for Xpert Xpress SARS-CoV-2/FLU/RSV testing.  Fact Sheet for Patients: bloggercourse.com  Fact Sheet for Healthcare Providers: seriousbroker.it  This test is not yet approved or cleared by the [place]  FDA and has  been authorized for detection and/or diagnosis of SARS-CoV-2 by FDA under an Emergency Use Authorization (EUA). This EUA will remain in effect (meaning this test can be used) for the duration of the COVID-19 declaration under Section 564(b)(1) of the Act, 21 U.S.C. section 360bbb-3(b)(1), unless the authorization is terminated or revoked.     Resp Syncytial Virus by PCR NEGATIVE NEGATIVE Final    Comment: (NOTE) Fact Sheet for Patients: bloggercourse.com  Fact Sheet for Healthcare Providers: seriousbroker.it  This test is not yet approved or cleared by the [place]  FDA and has been authorized for detection and/or diagnosis of SARS-CoV-2 by FDA under an Emergency Use Authorization (EUA). This EUA will remain in effect (meaning this test can be used) for the duration of the COVID-19 declaration under Section 564(b)(1) of the Act, 21 U.S.C. section 360bbb-3(b)(1), unless the authorization is terminated or revoked.  Performed at [facility], [address], [place], [place] [address]     Labs: CBC: Recent Labs  Lab 03/14/24 [phone] 03/15/24 0517 03/16/24 0841  WBC 15.1* 11.2* 10.3  NEUTROABS 13.2*  --   --   HGB 15.6 13.2 14.4  HCT 45.1 39.8 43.3  MCV 84.6 87.5 87.1  PLT 347 284 294   Basic Metabolic Panel: Recent Labs  Lab 03/14/24 0934 03/15/24 0517 03/16/24 0841  NA 139 141 136  K 4.5 3.6 3.9  CL 94* 104 101  CO2 25 25 22   GLUCOSE 281* 163* 165*  BUN 28* 29* 23  CREATININE 1.33* 1.10 1.10  CALCIUM  10.0 8.7* 9.2  MG  --  2.3  --    Liver Function Tests: Recent Labs  Lab 03/14/24 0934 03/16/24 0841  AST 24 56*  ALT 25 35  ALKPHOS 98 84  BILITOT 0.8 0.7  PROT 8.0 7.0  ALBUMIN 4.8 4.2   CBG: Recent Labs  Lab 03/15/24 0800 03/15/24 1326 03/15/24 1711 03/15/24 2144 03/16/24 0748  GLUCAP 174* 154* 130* 118* 147*    Discharge time spent: approximately 45 minutes spent on  discharge counseling, evaluation of patient on  day of discharge, and coordination of discharge planning with nursing, social work, pharmacy and case management  Signed: Lonni SHAUNNA Dalton, MD [facility] 03/16/2024         "
